# Patient Record
Sex: Female | Born: 1945
Health system: Southern US, Community
[De-identification: ages and names within clinical notes are randomized; demographics above are authoritative.]

## PROBLEM LIST (undated history)

## (undated) DIAGNOSIS — I10 Essential (primary) hypertension: Secondary | ICD-10-CM

## (undated) DIAGNOSIS — T7840XA Allergy, unspecified, initial encounter: Secondary | ICD-10-CM

## (undated) DIAGNOSIS — E785 Hyperlipidemia, unspecified: Secondary | ICD-10-CM

## (undated) DIAGNOSIS — J310 Chronic rhinitis: Secondary | ICD-10-CM

## (undated) DIAGNOSIS — Z86010 Personal history of colonic polyps: Secondary | ICD-10-CM

## (undated) DIAGNOSIS — R7302 Impaired glucose tolerance (oral): Secondary | ICD-10-CM

## (undated) DIAGNOSIS — N951 Menopausal and female climacteric states: Secondary | ICD-10-CM

## (undated) DIAGNOSIS — K5730 Diverticulosis of large intestine without perforation or abscess without bleeding: Secondary | ICD-10-CM

## (undated) HISTORY — DX: Hyperlipidemia, unspecified: E78.5

## (undated) HISTORY — PX: BREAST BIOPSY: SHX20

## (undated) HISTORY — DX: Allergy, unspecified, initial encounter: T78.40XA

## (undated) HISTORY — PX: POLYPECTOMY: SHX149

## (undated) HISTORY — DX: Essential (primary) hypertension: I10

## (undated) HISTORY — DX: Diverticulosis of large intestine without perforation or abscess without bleeding: K57.30

## (undated) HISTORY — DX: Personal history of colonic polyps: Z86.010

## (undated) HISTORY — DX: Impaired glucose tolerance (oral): R73.02

## (undated) HISTORY — PX: COLONOSCOPY: SHX174

## (undated) HISTORY — DX: Chronic rhinitis: J31.0

## (undated) HISTORY — DX: Menopausal and female climacteric states: N95.1

## (undated) HISTORY — PX: EYE SURGERY: SHX253

---

## 1992-01-22 HISTORY — PX: TUBAL LIGATION: SHX77

## 2001-04-10 ENCOUNTER — Other Ambulatory Visit: Admission: RE | Admit: 2001-04-10 | Discharge: 2001-04-10 | Payer: Self-pay | Admitting: Internal Medicine

## 2003-04-20 ENCOUNTER — Other Ambulatory Visit: Admission: RE | Admit: 2003-04-20 | Discharge: 2003-04-20 | Payer: Self-pay | Admitting: Internal Medicine

## 2004-05-28 ENCOUNTER — Other Ambulatory Visit: Admission: RE | Admit: 2004-05-28 | Discharge: 2004-05-28 | Payer: Self-pay | Admitting: Internal Medicine

## 2004-05-28 ENCOUNTER — Ambulatory Visit: Payer: Self-pay | Admitting: Internal Medicine

## 2004-05-31 ENCOUNTER — Ambulatory Visit: Payer: Self-pay | Admitting: Gastroenterology

## 2004-06-11 ENCOUNTER — Ambulatory Visit: Payer: Self-pay | Admitting: Gastroenterology

## 2004-06-11 ENCOUNTER — Encounter (INDEPENDENT_AMBULATORY_CARE_PROVIDER_SITE_OTHER): Payer: Self-pay | Admitting: Specialist

## 2004-09-28 ENCOUNTER — Ambulatory Visit: Payer: Self-pay | Admitting: Internal Medicine

## 2005-01-21 LAB — HM MAMMOGRAPHY

## 2005-04-01 ENCOUNTER — Ambulatory Visit: Payer: Self-pay | Admitting: Internal Medicine

## 2005-06-10 ENCOUNTER — Ambulatory Visit: Payer: Self-pay | Admitting: Internal Medicine

## 2005-10-07 ENCOUNTER — Ambulatory Visit: Payer: Self-pay | Admitting: Internal Medicine

## 2006-06-23 ENCOUNTER — Ambulatory Visit: Payer: Self-pay | Admitting: Internal Medicine

## 2006-06-24 ENCOUNTER — Encounter: Payer: Self-pay | Admitting: Internal Medicine

## 2006-06-24 DIAGNOSIS — Z860100 Personal history of colon polyps, unspecified: Secondary | ICD-10-CM

## 2006-06-24 DIAGNOSIS — N951 Menopausal and female climacteric states: Secondary | ICD-10-CM

## 2006-06-24 DIAGNOSIS — I10 Essential (primary) hypertension: Secondary | ICD-10-CM | POA: Insufficient documentation

## 2006-06-24 HISTORY — DX: Personal history of colon polyps, unspecified: Z86.0100

## 2006-06-24 HISTORY — DX: Menopausal and female climacteric states: N95.1

## 2006-06-24 HISTORY — DX: Personal history of colonic polyps: Z86.010

## 2006-06-24 HISTORY — DX: Essential (primary) hypertension: I10

## 2006-11-11 ENCOUNTER — Ambulatory Visit: Payer: Self-pay | Admitting: Internal Medicine

## 2006-11-11 DIAGNOSIS — K5730 Diverticulosis of large intestine without perforation or abscess without bleeding: Secondary | ICD-10-CM

## 2006-11-11 HISTORY — DX: Diverticulosis of large intestine without perforation or abscess without bleeding: K57.30

## 2006-11-11 LAB — CONVERTED CEMR LAB
ALT: 17 units/L (ref 0–35)
AST: 19 units/L (ref 0–37)
Albumin: 4 g/dL (ref 3.5–5.2)
Alkaline Phosphatase: 69 units/L (ref 39–117)
BUN: 10 mg/dL (ref 6–23)
Basophils Absolute: 0 10*3/uL (ref 0.0–0.1)
Basophils Relative: 0.1 % (ref 0.0–1.0)
CO2: 34 meq/L — ABNORMAL HIGH (ref 19–32)
Calcium: 9.7 mg/dL (ref 8.4–10.5)
Chloride: 103 meq/L (ref 96–112)
Cholesterol: 201 mg/dL (ref 0–200)
Creatinine, Ser: 0.8 mg/dL (ref 0.4–1.2)
MCHC: 34.5 g/dL (ref 30.0–36.0)
Monocytes Relative: 7.5 % (ref 3.0–11.0)
Platelets: 410 10*3/uL — ABNORMAL HIGH (ref 150–400)
Potassium: 3.9 meq/L (ref 3.5–5.1)
RBC: 4.32 M/uL (ref 3.87–5.11)
RDW: 12.2 % (ref 11.5–14.6)
TSH: 0.92 microintl units/mL (ref 0.35–5.50)
Total Bilirubin: 0.7 mg/dL (ref 0.3–1.2)
Total CHOL/HDL Ratio: 5
Triglycerides: 109 mg/dL (ref 0–149)
VLDL: 22 mg/dL (ref 0–40)

## 2006-11-19 ENCOUNTER — Telehealth: Payer: Self-pay | Admitting: Internal Medicine

## 2007-01-19 ENCOUNTER — Ambulatory Visit: Payer: Self-pay | Admitting: Family Medicine

## 2007-01-19 DIAGNOSIS — J069 Acute upper respiratory infection, unspecified: Secondary | ICD-10-CM | POA: Insufficient documentation

## 2007-01-19 DIAGNOSIS — J310 Chronic rhinitis: Secondary | ICD-10-CM

## 2007-01-19 HISTORY — DX: Chronic rhinitis: J31.0

## 2007-02-16 ENCOUNTER — Ambulatory Visit: Payer: Self-pay | Admitting: Internal Medicine

## 2007-07-27 ENCOUNTER — Ambulatory Visit: Payer: Self-pay | Admitting: Gastroenterology

## 2007-08-10 ENCOUNTER — Ambulatory Visit: Payer: Self-pay | Admitting: Gastroenterology

## 2007-08-10 LAB — HM COLONOSCOPY

## 2007-08-17 ENCOUNTER — Telehealth: Payer: Self-pay | Admitting: Gastroenterology

## 2007-10-28 ENCOUNTER — Ambulatory Visit: Payer: Self-pay | Admitting: Internal Medicine

## 2007-12-23 ENCOUNTER — Encounter: Payer: Self-pay | Admitting: Internal Medicine

## 2007-12-28 ENCOUNTER — Encounter: Payer: Self-pay | Admitting: Internal Medicine

## 2008-09-01 ENCOUNTER — Ambulatory Visit: Payer: Self-pay | Admitting: Internal Medicine

## 2008-12-13 ENCOUNTER — Telehealth: Payer: Self-pay | Admitting: Internal Medicine

## 2008-12-20 ENCOUNTER — Ambulatory Visit: Payer: Self-pay | Admitting: Internal Medicine

## 2008-12-20 DIAGNOSIS — E785 Hyperlipidemia, unspecified: Secondary | ICD-10-CM

## 2008-12-20 HISTORY — DX: Hyperlipidemia, unspecified: E78.5

## 2009-01-04 ENCOUNTER — Encounter (INDEPENDENT_AMBULATORY_CARE_PROVIDER_SITE_OTHER): Payer: Self-pay | Admitting: *Deleted

## 2009-01-10 ENCOUNTER — Encounter (INDEPENDENT_AMBULATORY_CARE_PROVIDER_SITE_OTHER): Payer: Self-pay | Admitting: *Deleted

## 2009-01-10 ENCOUNTER — Encounter: Payer: Self-pay | Admitting: Internal Medicine

## 2010-01-05 ENCOUNTER — Encounter: Payer: Self-pay | Admitting: Internal Medicine

## 2010-02-18 LAB — CONVERTED CEMR LAB
AST: 19 units/L (ref 0–37)
Alkaline Phosphatase: 60 units/L (ref 39–117)
BUN: 14 mg/dL (ref 6–23)
Basophils Absolute: 0 10*3/uL (ref 0.0–0.1)
Bilirubin, Direct: 0 mg/dL (ref 0.0–0.3)
Blood in Urine, dipstick: NEGATIVE
Calcium: 9.8 mg/dL (ref 8.4–10.5)
GFR calc non Af Amer: 108.63 mL/min (ref >60)
Glucose, Bld: 121 mg/dL — ABNORMAL HIGH (ref 70–99)
HDL: 43 mg/dL (ref >39.00)
Ketones, urine, test strip: NEGATIVE
Lymphocytes Relative: 41.8 % (ref 12.0–46.0)
Monocytes Relative: 7.6 % (ref 3.0–12.0)
Nitrite: NEGATIVE
Platelets: 268 10*3/uL (ref 150.0–400.0)
Protein, U semiquant: NEGATIVE
RDW: 13 % (ref 11.5–14.6)
Sodium: 141 meq/L (ref 135–145)
Specific Gravity, Urine: 1.015
Total Bilirubin: 1 mg/dL (ref 0.3–1.2)
Triglycerides: 99 mg/dL (ref 0.0–149.0)
VLDL: 19.8 mg/dL (ref 0.0–40.0)

## 2010-03-09 ENCOUNTER — Other Ambulatory Visit: Payer: Self-pay | Admitting: Internal Medicine

## 2010-03-09 DIAGNOSIS — I10 Essential (primary) hypertension: Secondary | ICD-10-CM

## 2010-05-23 ENCOUNTER — Telehealth: Payer: Self-pay | Admitting: Internal Medicine

## 2010-05-23 NOTE — Telephone Encounter (Signed)
Pt has fasting cpx scheduled for 07/04/10 at 8:45am. Pts spouse is coming in on 06/21/10 for ov at 8am and pt is req to rsc her cpx for that date in a.m., right after spouse appt. Pls advise.

## 2010-05-24 NOTE — Telephone Encounter (Signed)
Put her in at 815 slot and block 830 since this will be a appt. Stress they do need to be here at 8 to start since we are making an acception and scheduling her in a non cpx time frame.

## 2010-05-24 NOTE — Telephone Encounter (Signed)
Called and lft pt vm, stating that her cpx has been rsc to 06/21/10 per pt req, ok per dr.

## 2010-06-08 NOTE — Assessment & Plan Note (Signed)
[facility] OFFICE NOTE   NAME:[name] [name] WITMAN                      MRN:          [id number]  DATE:10/07/2005                            DOB:          Aug 30, 1945    This 65 year old female seen today for a comprehensive exam.  She has a  history of hypertension, colonic polyps, and is postmenopausal.  She has a  history of tubal ligation.  She has done quite well.   NO ALLERGIES.   Life-long nonsmoker.   MEDICAL REGIMEN:  Includes lisinopril hydrochlorothiazide combination.   REVIEW OF SYSTEMS:  Negative.  Did have a colonoscopy done approximately a  year-and-a-half ago that did reveal a large polyp.   FAMILY HISTORY:  Mother has a history of senile dementia.  Father died at 61  of lung cancer.  One brother and one sister remain well.   EXAMINATION:  VITAL SIGNS:  Blood pressure is 140/80 on repeat.  HEENT:  Fundi, ear, nose, and throat clear.  NECK:  No bruits.  BREASTS:  Negative.  CARDIOVASCULAR:  Normal heart sounds.  No murmurs.  ABDOMEN:  Benign.  PELVIC:  No abnormalities.  RECTAL:  Heme-negative.  EXTREMITIES:  No edema.  Peripheral pulses full.  NEURO:  Negative.   IMPRESSION:  1. Hypertension.  2. Colonic polyps.  3. Menopausal syndrome.   DISPOSITION:  Will recheck in 6 months.  Home blood pressure monitoring  encouraged.  Laboratory panel will be reviewed.                                  Gordy Savers, MD   PFK/MedQ  DD:  10/07/2005  DT:  10/07/2005  Job #:  [id number]

## 2010-06-20 ENCOUNTER — Encounter: Payer: Self-pay | Admitting: Internal Medicine

## 2010-06-21 ENCOUNTER — Encounter: Payer: Self-pay | Admitting: Internal Medicine

## 2010-06-21 ENCOUNTER — Ambulatory Visit (INDEPENDENT_AMBULATORY_CARE_PROVIDER_SITE_OTHER): Payer: BC Managed Care – PPO | Admitting: Internal Medicine

## 2010-06-21 VITALS — BP 110/74 | HR 74 | Temp 98.3°F | Resp 18 | Ht 60.0 in | Wt 172.0 lb

## 2010-06-21 DIAGNOSIS — I10 Essential (primary) hypertension: Secondary | ICD-10-CM

## 2010-06-21 DIAGNOSIS — Z86010 Personal history of colonic polyps: Secondary | ICD-10-CM

## 2010-06-21 DIAGNOSIS — E785 Hyperlipidemia, unspecified: Secondary | ICD-10-CM

## 2010-06-21 DIAGNOSIS — Z0000 Encounter for general adult medical examination without abnormal findings: Secondary | ICD-10-CM

## 2010-06-21 LAB — HEPATIC FUNCTION PANEL
ALT: 14 U/L (ref 0–35)
AST: 19 U/L (ref 0–37)
Alkaline Phosphatase: 62 U/L (ref 39–117)
Bilirubin, Direct: 0.1 mg/dL (ref 0.0–0.3)
Total Bilirubin: 0.7 mg/dL (ref 0.3–1.2)
Total Protein: 6.9 g/dL (ref 6.0–8.3)

## 2010-06-21 LAB — CBC WITH DIFFERENTIAL/PLATELET
Basophils Relative: 0.6 % (ref 0.0–3.0)
Eosinophils Relative: 4 % (ref 0.0–5.0)
Lymphocytes Relative: 47.9 % — ABNORMAL HIGH (ref 12.0–46.0)
MCV: 92 fl (ref 78.0–100.0)
Monocytes Absolute: 0.4 10*3/uL (ref 0.1–1.0)
Monocytes Relative: 7.6 % (ref 3.0–12.0)
Neutrophils Relative %: 39.9 % — ABNORMAL LOW (ref 43.0–77.0)
Platelets: 247 10*3/uL (ref 150.0–400.0)
RBC: 4.42 Mil/uL (ref 3.87–5.11)
WBC: 4.9 10*3/uL (ref 4.5–10.5)

## 2010-06-21 LAB — LDL CHOLESTEROL, DIRECT: Direct LDL: 170.7 mg/dL

## 2010-06-21 LAB — BASIC METABOLIC PANEL
Chloride: 102 mEq/L (ref 96–112)
Creatinine, Ser: 0.7 mg/dL (ref 0.4–1.2)
GFR: 108.01 mL/min (ref >60.00)

## 2010-06-21 LAB — LIPID PANEL: Total CHOL/HDL Ratio: 5

## 2010-06-21 LAB — TSH: TSH: 1.22 u[IU]/mL (ref 0.35–5.50)

## 2010-06-21 MED ORDER — LISINOPRIL-HYDROCHLOROTHIAZIDE 20-12.5 MG PO TABS: 1.0000 | ORAL_TABLET | Freq: Every day | ORAL | Status: DC

## 2010-06-21 NOTE — Progress Notes (Signed)
  Subjective:    Patient ID: Mathews Argyle, female    DOB: [birth year], 65 y.o.   MRN: [id number]  HPI  Wt Readings from Last 3 Encounters:  06/21/10 172 lb (78.019 kg)  12/20/08 171 lb (77.565 kg)  09/01/08 174 lb (78.926 kg)     History of Present Illness:  65 year old patient who is seen today for an annual preventive health examination. She has a history of treated hypertension and colonic polyps. Last colonoscopy was in July of 2009 showed diverticulosis and a history of allergic rhinitis. Her blood pressure has been well-controlled. She has a history of exogenous obesity  Allergies:  No Known Drug Allergies  Past History:  Past Medical History:  Colonic polyps, hx of  Diverticulosis, colon  Hypertension  G3 P2 A0  Past Surgical History:  Tubal ligation  colonoscopy 7-09  Family History:  Reviewed history from 11/11/2006 and no changes required.  father died age 64, lung cancer  mother history senile dementia, Alzheimer's type  one brother-Parkinsonism  one sister in good health  Social History:  Reviewed history from 11/11/2006 and no changes required.  Married    Review of Systems     Objective:   Physical Exam        Assessment & Plan:

## 2010-06-21 NOTE — Patient Instructions (Signed)
Limit your sodium (Salt) intake    It is important that you exercise regularly, at least 20 minutes 3 to 4 times per week.  If you develop chest pain or shortness of breath seek  medical attention.  You need to lose weight.  Consider a lower calorie diet and regular exercise.  Return in 6 months for follow-up About Your Cholesterol Cholesterol (plaque) is a type of fat. Cholesterol travels through your body in your blood. Your body needs a small amount of cholesterol, but too much can cause health problems. You get cholesterol in two ways:  It is naturally made in your body by the liver.   It is a part of many foods that you eat, like:   Fatty meats.   Fried foods.   Dairy foods like whole milk, cheese, and butter.   Eggs.  WHY IS A HIGH CHOLESTEROL LEVEL BAD FOR ME?  Your blood vessels may clog up when you have too much cholesterol in your blood. This can cause:   Heart attacks.   Strokes.   Not enough blood flow to your heart, brain, kidneys, or feet.  IS ALL CHOLESTEROL BAD FOR ME? When you eat foods that have a lot of cholesterol, you add to the cholesterol that is already made by your body. Not all cholesterol is bad for you. There are 2 different kinds of cholesterol.    The "good" kind of cholesterol, called High-Density Lipoproteins (HDL). HDL helps your body. It finds and picks up "bad" cholesterol in your blood and takes it back to the liver. Your liver then gets rid of this bad cholesterol.   The "bad" kind of cholesterol is Low-Density Lipoproteins (LDL). LDL can clog your blood vessels. Too much LDL cholesterol is harmful to your body.  HOW WILL I KNOW IF MY CHOLESTEROL LEVEL IS HIGH?  A blood test is done to check your total cholesterol level. Your HDL and LDL level will also be checked.   Your total cholesterol should be less than 200 mg/ml. If it is more than 240 mg/ml, your cholesterol level is high.   Your LDL cholesterol should be less than 100 mg/ml.    Your HDL cholesterol should be between 50-60 mg/ml. An HDL level less than 40 mg/ml may lead to heart disease.  HOW TO LOWER YOUR CHOLESTEROL LEVEL  Eat a low-fat diet:   Eat less eggs, whole dairy products (whole milk, cheese, and butter), fatty meats, and fried foods.   Eat more fruits, vegetables, whole-wheat breads, lean chicken, and fish (such as salmon or tuna).   Exercise more. Talk to your doctor about an exercise plan that is right for you.   Keep your weight at a healthy level. Talk to your doctor about what is right for you.   Take medicines as your doctor tells you to.  HOW OFTEN SHOULD I GET MY CHOLESTEROL LEVEL CHECKED? Your cholesterol level should be checked at least once a year or as often as your doctor tells you. Easy-to-Read style based on content from [facility] [place], [place] Document Released: 04/05/2008 Document Re-Released: 11/04/2008 [facility] Patient Information 2011 [place], [place].Low-Fat, Low-Saturated-Fat, Low-Cholesterol Diets Food Selection Guide BREADS, CEREALS, PASTA, RICE, DRIED PEAS, AND BEANS These products are high in carbohydrates and most are low in fat. Therefore, they can be increased in the diet as substitutes for fatty foods. They too, however, contain calories and should not be eaten in excess. Cereals can be eaten for snacks as well  as for breakfast.   Include foods that contain fiber (fruits, vegetables, whole grains, and legumes). Research shows that fiber may lower blood cholesterol levels, especially the water-soluble fiber found in fruits, vegetables, oat products, and legumes. FRUITS AND VEGETABLES It is good to eat fruits and vegetables. Besides being sources of fiber, both are rich in vitamins and some minerals. They help you get the daily allowances of these nutrients. Fruits and vegetables can be used for snacks and desserts. MEATS Limit lean meat, chicken, [place], and fish to no more than 6 ounces per  day. Beef, Pork, and Lamb  Use lean cuts of beef, pork, and lamb. Lean cuts include:   Extra-lean ground beef.   Arm roast.   Sirloin tip.   Center-cut ham.   Round steak.   Loin chops.   Rump roast.   Tenderloin.  Trim all fat off the outside of meats before cooking. It is not necessary to severely decrease the intake of red meat, but lean choices should be made. Lean meat is rich in protein and contains a highly absorbable form of iron. Premenopausal women, in particular, should avoid reducing lean red meat because this could increase the risk for low red blood cells (iron-deficiency anemia). Processed Meats Processed meats, such as bacon, bologna, salami, sausage, and hot dogs contain large quantities of fat, are not rich in valuable nutrients, and should not be eaten very often. Organ Meats The organ meats, such as liver, sweetbreads, kidneys, and brain are very rich in cholesterol. They should be limited. Chicken and [place] These are good sources of protein. The fat of poultry can be reduced by removing the skin and underlying fat layers before cooking. Chicken and [place] can be substituted for lean red meat in the diet. Poultry should not be fried or covered with high-fat sauces. Fish and Shellfish Fish is a good source of protein. Shellfish contain cholesterol, but they usually are low in saturated fatty acids. The preparation of fish is important. Like chicken and [place], they should not be fried or covered with high-fat sauces. EGGS Egg yolks often are hidden in cooked and processed foods. Egg whites contain no fat or cholesterol. They can be eaten often. Try 1 to 2 egg whites instead of whole eggs in recipes or use egg substitutes that do not contain yolk. MILK AND DAIRY PRODUCTS Use skim or 1% milk instead of 2% or whole milk. Decrease whole milk, natural, and processed cheeses. Use nonfat or low-fat (2%) cottage cheese or low-fat cheeses made from vegetable oils. Choose  nonfat or low-fat (1 to 2%) yogurt. Experiment with evaporated skim milk in recipes that call for heavy cream. Substitute low-fat yogurt or low-fat cottage cheese for sour cream in dips and salad dressings. Have at least 2 servings of low-fat dairy products, such as 2 glasses of skim (or 1%) milk each day to help get your daily calcium intake. FATS AND OILS Reduce the total intake of fats, especially saturated fat. Butterfat, lard, and beef fats are high in saturated fat and cholesterol. These should be avoided as much as possible. Vegetable fats do not contain cholesterol, but certain vegetable fats, such as coconut oil, palm oil, and palm kernel oil are very high in saturated fats. These should be limited. These fats are often used in bakery goods, processed foods, popcorn, oils, and nondairy creamers. Vegetable shortenings and some peanut butters contain hydrogenated oils, which are also saturated fats. Read the labels on these foods and check for saturated vegetable  oils. Unsaturated vegetable oils and fats do not raise blood cholesterol. However, they should be limited because they are fats and are high in calories. Total fat should still be limited to 30% of your daily caloric intake. Desirable liquid vegetable oils are corn oil, cottonseed oil, olive oil, canola oil, safflower oil, soybean oil, and sunflower oil. Peanut oil is not as good, but small amounts are acceptable. Buy a heart-healthy tub margarine that has no partially hydrogenated oils in the ingredients. Mayonnaise and salad dressings often are made from unsaturated fats, but they should also be limited because of their high calorie and fat content. Seeds, nuts, peanut butter, olives, and avocados are high in fat, but the fat is mainly the unsaturated type. These foods should be limited mainly to avoid excess calories and fat. OTHER EATING TIPS Snacks  Most sweets should be limited as snacks. They tend to be rich in calories and fats, and  their caloric content outweighs their nutritional value. Some good choices in snacks are graham crackers, melba toast, soda crackers, bagels (no egg), English muffins, fruits, and vegetables. These snacks are preferable to snack crackers, [place] fries, and chips. Popcorn should be air-popped or cooked in small amounts of liquid vegetable oil. Desserts Eat fruit, low-fat yogurt, and fruit ices instead of pastries, cake, and cookies. Sherbet, angel food cake, gelatin dessert, frozen low-fat yogurt, or other frozen products that do not contain saturated fat (pure fruit juice bars, frozen ice pops) are also acceptable.   COOKING METHODS Choose those methods that use little or no fat. They include:  Poaching.   Braising.   Steaming.   Grilling.   Baking.   Stir-frying.   Broiling.   Microwaving.  Foods can be cooked in a nonstick pan without added fat, or use a nonfat cooking spray in regular cookware. Limit fried foods and avoid frying in saturated fat. Add moisture to lean meats by using water, broth, cooking wines, and other nonfat or low-fat sauces along with the cooking methods mentioned above. Soups and stews should be chilled after cooking. The fat that forms on top after a few hours in the refrigerator should be skimmed off. When preparing meals, avoid using excess salt. Salt can contribute to raising blood pressure in some people. EATING AWAY FROM HOME Order entres, potatoes, and vegetables without sauces or butter. When meat exceeds the size of a deck of cards (3 to 4 ounces), the rest can be taken home for another meal. Choose vegetable or fruit salads and ask for low-calorie salad dressings to be served on the side. Use dressings sparingly. Limit high-fat toppings, such as bacon, crumbled eggs, cheese, sunflower seeds, and olives. Ask for heart-healthy tub margarine instead of butter. Document Released: 06/29/2001 Document Re-Released: 04/03/2009 [facility] Patient Information 2011  [place], [place].

## 2010-06-21 NOTE — Progress Notes (Signed)
Subjective:    Patient ID: Brenda [name], female    DOB: November 23, 1945, 65 y.o.   MRN: [id number]  HPI  65 year old patient who is seen today for an annual physical medical problems include hypertension exogenous obesity and a history of colonic polyps. She is doing quite well today;  she retired at the first of the year and does seem to be exercising regularly but has not been successful with weight loss. She is doing quite well and denies any concerns or complaints  History of Present Illness:  65 year old patient who is seen today for an annual preventive health examination. She has a history of treated hypertension and colonic polyps. Last colonoscopy was in July of 2009 showed diverticulosis and a history of allergic rhinitis. Her blood pressure has been well-controlled. She has a history of exogenous obesity  Allergies:  No Known Drug Allergies  Past History:  Past Medical History:  Colonic polyps, hx of  Diverticulosis, colon  Hypertension  G3 P2 A0  Past Surgical History:  Tubal ligation  colonoscopy 7-09  Family History:  Reviewed history from 11/11/2006 and no changes required.  father died age 31, lung cancer  mother history senile dementia, Alzheimer's type  one brother-Parkinsonism  one sister in good health  Social History:  Reviewed history from 11/11/2006 and no changes required.  Married-  retired January 2012    Review of Systems  Constitutional: Negative for fever, appetite change, fatigue and unexpected weight change.  HENT: Negative for hearing loss, ear pain, nosebleeds, congestion, sore throat, mouth sores, trouble swallowing, neck stiffness, dental problem, voice change, sinus pressure and tinnitus.   Eyes: Negative for photophobia, pain, redness and visual disturbance.  Respiratory: Negative for cough, chest tightness and shortness of breath.   Cardiovascular: Negative for chest pain, palpitations and leg swelling.  Gastrointestinal: Negative for nausea,  vomiting, abdominal pain, diarrhea, constipation, blood in stool, abdominal distention and rectal pain.  Genitourinary: Negative for dysuria, urgency, frequency, hematuria, flank pain, vaginal bleeding, vaginal discharge, difficulty urinating, genital sores, vaginal pain, menstrual problem and pelvic pain.  Musculoskeletal: Negative for back pain and arthralgias.  Skin: Negative for rash.  Neurological: Negative for dizziness, syncope, speech difficulty, weakness, light-headedness, numbness and headaches.  Hematological: Negative for adenopathy. Does not bruise/bleed easily.  Psychiatric/Behavioral: Negative for suicidal ideas, behavioral problems, self-injury, dysphoric mood and agitation. The patient is not nervous/anxious.        Objective:   Physical Exam  Constitutional: She is oriented to person, place, and time. She appears well-developed and well-nourished.  HENT:  Head: Normocephalic and atraumatic.  Right Ear: External ear normal.  Left Ear: External ear normal.  Mouth/Throat: Oropharynx is clear and moist.  Eyes: Conjunctivae and EOM are normal.  Neck: Normal range of motion. Neck supple. No JVD present. No thyromegaly present.  Cardiovascular: Normal rate, regular rhythm, normal heart sounds and intact distal pulses.   No murmur heard. Pulmonary/Chest: Effort normal and breath sounds normal. She has no wheezes. She has no rales.  Abdominal: Soft. Bowel sounds are normal. She exhibits no distension and no mass. There is no tenderness. There is no rebound and no guarding.  Genitourinary: Vagina normal. Guaiac negative stool. No vaginal discharge found.  Musculoskeletal: Normal range of motion. She exhibits no edema and no tenderness.  Neurological: She is alert and oriented to person, place, and time. She has normal reflexes. No cranial nerve deficit. She exhibits normal muscle tone. Coordination normal.  Skin: Skin is warm and dry. No rash  noted.  Psychiatric: She has a normal  mood and affect. Her behavior is normal.          Assessment & Plan:   Annual health examination. Hypertension well controlled Exogenous obesity. Exercise weight loss better diet all encouraged  We'll recheck in 6 months

## 2010-07-04 ENCOUNTER — Encounter: Payer: Self-pay | Admitting: Internal Medicine

## 2010-09-04 ENCOUNTER — Telehealth: Payer: Self-pay | Admitting: Internal Medicine

## 2010-09-04 NOTE — Telephone Encounter (Signed)
Acute sinusitis symptoms for less than 10 days are generally not helped by antibiotic therapy.  Use saline irrigation, warm  moist compresses and over-the-counter decongestants only as directed.  Call if there is no improvement in 5 to 7 days, or sooner if you develop increasing pain, fever, or any new symptoms. 

## 2010-09-04 NOTE — Telephone Encounter (Signed)
Attempt to call- ans mach - LMTCB if questions - informed of dr. Vernon Prey instruction. KIK

## 2010-09-04 NOTE — Telephone Encounter (Signed)
Please advise 

## 2010-09-04 NOTE — Telephone Encounter (Signed)
Pt called and said that she has a sinus inf, like she gets every year at this time. Pt is req an antibiotic to be called in to [facility] on [address].

## 2010-10-02 ENCOUNTER — Ambulatory Visit (INDEPENDENT_AMBULATORY_CARE_PROVIDER_SITE_OTHER): Payer: BC Managed Care – PPO | Admitting: Internal Medicine

## 2010-10-02 ENCOUNTER — Encounter: Payer: Self-pay | Admitting: Internal Medicine

## 2010-10-02 VITALS — BP 140/90 | Temp 98.9°F | Wt 172.0 lb

## 2010-10-02 DIAGNOSIS — Z0000 Encounter for general adult medical examination without abnormal findings: Secondary | ICD-10-CM

## 2010-10-02 DIAGNOSIS — Z23 Encounter for immunization: Secondary | ICD-10-CM

## 2010-10-02 DIAGNOSIS — E785 Hyperlipidemia, unspecified: Secondary | ICD-10-CM

## 2010-10-02 DIAGNOSIS — I10 Essential (primary) hypertension: Secondary | ICD-10-CM

## 2010-10-02 NOTE — Patient Instructions (Signed)
Limit your sodium (Salt) intake    It is important that you exercise regularly, at least 20 minutes 3 to 4 times per week.  If you develop chest pain or shortness of breath seek  medical attention.  Please check your blood pressure on a regular basis.  If it is consistently greater than 150/90, please make an office appointment.  

## 2010-10-02 NOTE — Progress Notes (Signed)
  Subjective:    Patient ID: Brenda [name], female    DOB: [birth year], 65 y.o.   MRN: [id number]  HPI  65 year old patient who is seen today for followup of her hypertension. She was seen she describes some mild right leg cramping but otherwise doing quite well. She is exercising regularly and a bit disappointed about lack of weight loss. No new concerns or complaints. She has treated hypertension which has been well-controlled on her present regimen. for her annual exam last visit.    Review of Systems  Constitutional: Negative.   HENT: Negative for hearing loss, congestion, sore throat, rhinorrhea, dental problem, sinus pressure and tinnitus.   Eyes: Negative for pain, discharge and visual disturbance.  Respiratory: Negative for cough and shortness of breath.   Cardiovascular: Negative for chest pain, palpitations and leg swelling.  Gastrointestinal: Negative for nausea, vomiting, abdominal pain, diarrhea, constipation, blood in stool and abdominal distention.  Genitourinary: Negative for dysuria, urgency, frequency, hematuria, flank pain, vaginal bleeding, vaginal discharge, difficulty urinating, vaginal pain and pelvic pain.  Musculoskeletal: Negative for joint swelling, arthralgias and gait problem.  Skin: Negative for rash.  Neurological: Negative for dizziness, syncope, speech difficulty, weakness, numbness and headaches.  Hematological: Negative for adenopathy.  Psychiatric/Behavioral: Negative for behavioral problems, dysphoric mood and agitation. The patient is not nervous/anxious.        Objective:   Physical Exam  Constitutional: She is oriented to person, place, and time. She appears well-developed and well-nourished.  HENT:  Head: Normocephalic.  Right Ear: External ear normal.  Left Ear: External ear normal.  Mouth/Throat: Oropharynx is clear and moist.  Eyes: Conjunctivae and EOM are normal. Pupils are equal, round, and reactive to light.  Neck: Normal range of  motion. Neck supple. No thyromegaly present.  Cardiovascular: Normal rate, regular rhythm, normal heart sounds and intact distal pulses.   Pulmonary/Chest: Effort normal and breath sounds normal.  Abdominal: Soft. Bowel sounds are normal. She exhibits no mass. There is no tenderness.  Musculoskeletal: Normal range of motion.  Lymphadenopathy:    She has no cervical adenopathy.  Neurological: She is alert and oriented to person, place, and time.  Skin: Skin is warm and dry. No rash noted.  Psychiatric: She has a normal mood and affect. Her behavior is normal.          Assessment & Plan:   Hypertension stable Mild dyslipidemia Low salt diet encouraged exercise weight loss recommended a recheck in 6 months

## 2011-01-07 LAB — HM MAMMOGRAPHY: HM Mammogram: NEGATIVE

## 2011-01-08 ENCOUNTER — Encounter: Payer: Self-pay | Admitting: Internal Medicine

## 2011-04-23 ENCOUNTER — Telehealth: Payer: Self-pay | Admitting: Internal Medicine

## 2011-04-23 MED ORDER — FLUTICASONE PROPIONATE 50 MCG/ACT NA SUSP: 2.0000 | Freq: Every day | NASAL | Status: DC

## 2011-04-23 NOTE — Telephone Encounter (Signed)
Suggest Allegra 180 one daily; please call in a prescription for fluticasone nasal spray 2 puffs each side once daily

## 2011-04-23 NOTE — Telephone Encounter (Signed)
Pt called back stating sinus is draining in gums

## 2011-04-23 NOTE — Telephone Encounter (Signed)
Addended by: Duard Brady I on: 04/23/2011 01:46 PM   Modules accepted: Orders

## 2011-04-23 NOTE — Telephone Encounter (Signed)
Patient called stating that she is having congestion and would like to have something called into her pharmacy. Please advise.

## 2011-04-23 NOTE — Telephone Encounter (Signed)
Spoke with pt- informed of med touse otc and the nasal spray rx to use. If no better next week - will need to see

## 2011-04-23 NOTE — Telephone Encounter (Signed)
Please advise 

## 2011-05-09 ENCOUNTER — Encounter: Payer: Self-pay | Admitting: Internal Medicine

## 2011-05-09 ENCOUNTER — Ambulatory Visit (INDEPENDENT_AMBULATORY_CARE_PROVIDER_SITE_OTHER): Payer: BC Managed Care – PPO | Admitting: Internal Medicine

## 2011-05-09 VITALS — BP 130/80 | Temp 98.1°F | Wt 174.0 lb

## 2011-05-09 DIAGNOSIS — J310 Chronic rhinitis: Secondary | ICD-10-CM

## 2011-05-09 DIAGNOSIS — I10 Essential (primary) hypertension: Secondary | ICD-10-CM

## 2011-05-09 NOTE — Patient Instructions (Signed)
Use saline irrigation, warm  moist compresses and over-the-counter decongestants only as directed.  Call if there is no improvement in 5 to 7 days, or sooner if you develop increasing pain, fever, or any new symptoms. 

## 2011-05-09 NOTE — Progress Notes (Signed)
  Subjective:    Patient ID: Brenda [name], female    DOB: 08-Feb-1945, 66 y.o.   MRN: [id number]  HPI  66 year old patient who is in today for followup. She has a history of treated hypertension and also a history of allergic rhinitis. She's had some nonproductive cough headache sinus fullness. She has been on and off exiting the takes fluticasone nasal spray only sporadically. There's been no fever purulent drainage postnasal drip    Review of Systems  Constitutional: Negative.   HENT: Positive for congestion, rhinorrhea and sinus pressure. Negative for hearing loss, sore throat, dental problem and tinnitus.   Eyes: Negative for pain, discharge and visual disturbance.  Respiratory: Negative for cough and shortness of breath.   Cardiovascular: Negative for chest pain, palpitations and leg swelling.  Gastrointestinal: Negative for nausea, vomiting, abdominal pain, diarrhea, constipation, blood in stool and abdominal distention.  Genitourinary: Negative for dysuria, urgency, frequency, hematuria, flank pain, vaginal bleeding, vaginal discharge, difficulty urinating, vaginal pain and pelvic pain.  Musculoskeletal: Negative for joint swelling, arthralgias and gait problem.  Skin: Negative for rash.  Neurological: Positive for headaches. Negative for dizziness, syncope, speech difficulty, weakness and numbness.  Hematological: Negative for adenopathy.  Psychiatric/Behavioral: Negative for behavioral problems, dysphoric mood and agitation. The patient is not nervous/anxious.        Objective:   Physical Exam  Constitutional: She is oriented to person, place, and time. She appears well-developed and well-nourished.  HENT:  Head: Normocephalic.  Right Ear: External ear normal.  Left Ear: External ear normal.  Mouth/Throat: Oropharynx is clear and moist.  Eyes: Conjunctivae and EOM are normal. Pupils are equal, round, and reactive to light.  Neck: Normal range of motion. Neck supple. No  thyromegaly present.  Cardiovascular: Normal rate, regular rhythm, normal heart sounds and intact distal pulses.   Pulmonary/Chest: Effort normal and breath sounds normal.  Abdominal: Soft. Bowel sounds are normal. She exhibits no mass. There is no tenderness.  Musculoskeletal: Normal range of motion.  Lymphadenopathy:    She has no cervical adenopathy.  Neurological: She is alert and oriented to person, place, and time.  Skin: Skin is warm and dry. No rash noted.  Psychiatric: She has a normal mood and affect. Her behavior is normal.          Assessment & Plan:   Allergic rhinitis. Hypertension  We'll resume to take his own nasal spray start saline irrigation and short-term Nasal Decongestant

## 2011-06-25 ENCOUNTER — Other Ambulatory Visit: Payer: Self-pay | Admitting: Internal Medicine

## 2011-10-03 ENCOUNTER — Ambulatory Visit (INDEPENDENT_AMBULATORY_CARE_PROVIDER_SITE_OTHER): Payer: BC Managed Care – PPO

## 2011-10-03 DIAGNOSIS — Z23 Encounter for immunization: Secondary | ICD-10-CM

## 2011-10-11 ENCOUNTER — Ambulatory Visit: Payer: BC Managed Care – PPO

## 2012-01-08 LAB — HM MAMMOGRAPHY: HM Mammogram: NEGATIVE

## 2012-01-20 ENCOUNTER — Encounter: Payer: Self-pay | Admitting: Internal Medicine

## 2012-02-11 ENCOUNTER — Encounter: Payer: Self-pay | Admitting: Internal Medicine

## 2012-02-11 ENCOUNTER — Ambulatory Visit (INDEPENDENT_AMBULATORY_CARE_PROVIDER_SITE_OTHER): Payer: BC Managed Care – PPO | Admitting: Internal Medicine

## 2012-02-11 VITALS — BP 160/90 | HR 92 | Temp 98.3°F | Resp 20 | Wt 174.0 lb

## 2012-02-11 DIAGNOSIS — M2550 Pain in unspecified joint: Secondary | ICD-10-CM

## 2012-02-11 DIAGNOSIS — J310 Chronic rhinitis: Secondary | ICD-10-CM

## 2012-02-11 DIAGNOSIS — I10 Essential (primary) hypertension: Secondary | ICD-10-CM

## 2012-02-11 MED ORDER — LISINOPRIL-HYDROCHLOROTHIAZIDE 20-12.5 MG PO TABS: 1.0000 | ORAL_TABLET | Freq: Every day | ORAL | Status: DC

## 2012-02-11 MED ORDER — FLUTICASONE PROPIONATE 50 MCG/ACT NA SUSP: 2.0000 | Freq: Every day | NASAL | Status: DC

## 2012-02-11 MED ORDER — PREDNISONE 20 MG PO TABS: 20.0000 mg | ORAL_TABLET | Freq: Two times a day (BID) | ORAL | Status: DC

## 2012-02-11 NOTE — Progress Notes (Signed)
Subjective:    Patient ID: Brenda [name], female    DOB: [birth year], 67 y.o.   MRN: [id number]  HPI  67 year old patient who has a history of hypertension and chronic rhinitis. Her chief complaints today are sinus related with increase in the congestion and fullness. She's not been using fluticasone. But has been using Allegra sporadically. She has treated hypertension. She also complains of some musculoskeletal pain involving the neck right shoulder and right hip areas  Past Medical History  Diagnosis Date  . Chronic rhinitis 01/19/2007  . COLONIC POLYPS, HX OF 06/24/2006  . DIVERTICULOSIS, COLON 11/11/2006  . HYPERLIPIDEMIA 12/20/2008  . HYPERTENSION 06/24/2006  . MENOPAUSAL SYNDROME 06/24/2006  . Impaired glucose tolerance     History   Social History  . Marital Status: Married    Spouse Name: N/A    Number of Children: N/A  . Years of Education: N/A   Occupational History  . Not on file.   Social History Main Topics  . Smoking status: Never Smoker   . Smokeless tobacco: Never Used  . Alcohol Use: No  . Drug Use: No  . Sexually Active: Not on file   Other Topics Concern  . Not on file   Social History Narrative  . No narrative on file    Past Surgical History  Procedure Date  . Tubal ligation     Family History  Problem Relation Age of Onset  . Mental illness Mother   . Cancer Father     lung  . Parkinsonism Brother     No Known Allergies  Current Outpatient Prescriptions on File Prior to Visit  Medication Sig Dispense Refill  . fexofenadine (ALLEGRA ALLERGY) 180 MG tablet Take 180 mg by mouth daily.      Marland Kitchen lisinopril-hydrochlorothiazide (PRINZIDE,ZESTORETIC) 20-12.5 MG per tablet TAKE ONE TABLET BY MOUTH EVERY DAY  90 tablet  3  . fluticasone (FLONASE) 50 MCG/ACT nasal spray Place 2 sprays into the nose daily.  16 g  1    BP 160/90  Pulse 92  Temp 98.3 F (36.8 C) (Oral)  Resp 20  Wt 174 lb (78.926 kg)  SpO2 98%       Review of Systems    Constitutional: Negative.   HENT: Positive for congestion and rhinorrhea. Negative for hearing loss, sore throat, dental problem, sinus pressure and tinnitus.   Eyes: Negative for pain, discharge and visual disturbance.  Respiratory: Negative for cough and shortness of breath.   Cardiovascular: Negative for chest pain, palpitations and leg swelling.  Gastrointestinal: Negative for nausea, vomiting, abdominal pain, diarrhea, constipation, blood in stool and abdominal distention.  Genitourinary: Negative for dysuria, urgency, frequency, hematuria, flank pain, vaginal bleeding, vaginal discharge, difficulty urinating, vaginal pain and pelvic pain.  Musculoskeletal: Positive for arthralgias. Negative for joint swelling and gait problem.  Skin: Negative for rash.  Neurological: Negative for dizziness, syncope, speech difficulty, weakness, numbness and headaches.  Hematological: Negative for adenopathy.  Psychiatric/Behavioral: Negative for behavioral problems, dysphoric mood and agitation. The patient is not nervous/anxious.        Objective:   Physical Exam  Constitutional: She is oriented to person, place, and time. She appears well-developed and well-nourished.  HENT:  Head: Normocephalic.  Right Ear: External ear normal.  Left Ear: External ear normal.  Mouth/Throat: Oropharynx is clear and moist.  Eyes: Conjunctivae normal and EOM are normal. Pupils are equal, round, and reactive to light.  Neck: Normal range of motion. Neck supple. No thyromegaly present.  Cardiovascular: Normal rate, regular rhythm, normal heart sounds and intact distal pulses.   Pulmonary/Chest: Effort normal and breath sounds normal.  Abdominal: Soft. Bowel sounds are normal. She exhibits no mass. There is no tenderness.  Musculoskeletal: Normal range of motion.  Lymphadenopathy:    She has no cervical adenopathy.  Neurological: She is alert and oriented to person, place, and time.  Skin: Skin is warm and dry.  No rash noted.  Psychiatric: She has a normal mood and affect. Her behavior is normal.          Assessment & Plan:  Hypertension. Low-salt diet weight loss encouraged repeat blood pressure 140/84 Chronic allergic rhinitis. Will treat with a brief course of oral prednisone resume nasal steroids  Recheck 4 months

## 2012-02-11 NOTE — Patient Instructions (Signed)
  Acute sinusitis symptoms for less than 10 days are generally not helped by antibiotic therapy.  Use saline irrigation, warm  moist compresses and over-the-counter decongestants only as directed.  Call if there is no improvement in 5 to 7 days, or sooner if you develop increasing pain, fever, or any new s  Limit your sodium (Salt) intake    It is important that you exercise regularly, at least 20 minutes 3 to 4 times per week.  If you develop chest pain or shortness of breath seek  medical attention.  You need to lose weight.  Consider a lower calorie diet and regular exercise.ymptoms.

## 2012-04-07 ENCOUNTER — Other Ambulatory Visit: Payer: BC Managed Care – PPO

## 2012-04-13 ENCOUNTER — Encounter: Payer: BC Managed Care – PPO | Admitting: Internal Medicine

## 2012-05-07 ENCOUNTER — Ambulatory Visit: Payer: BC Managed Care – PPO

## 2012-05-07 ENCOUNTER — Ambulatory Visit: Payer: BC Managed Care – PPO | Admitting: Internal Medicine

## 2012-05-07 ENCOUNTER — Other Ambulatory Visit (INDEPENDENT_AMBULATORY_CARE_PROVIDER_SITE_OTHER): Payer: BC Managed Care – PPO

## 2012-05-07 DIAGNOSIS — Z0000 Encounter for general adult medical examination without abnormal findings: Secondary | ICD-10-CM

## 2012-05-07 LAB — POCT URINALYSIS DIPSTICK
Spec Grav, UA: >=1.03
pH, UA: 5.5

## 2012-05-07 LAB — CBC WITH DIFFERENTIAL/PLATELET
Basophils Relative: 0.5 % (ref 0.0–3.0)
Eosinophils Relative: 3.6 % (ref 0.0–5.0)
Lymphocytes Relative: 47.3 % — ABNORMAL HIGH (ref 12.0–46.0)
MCV: 91 fl (ref 78.0–100.0)
Neutrophils Relative %: 40.5 % — ABNORMAL LOW (ref 43.0–77.0)
RBC: 4.74 Mil/uL (ref 3.87–5.11)
WBC: 6 10*3/uL (ref 4.5–10.5)

## 2012-05-07 LAB — BASIC METABOLIC PANEL
Calcium: 9.7 mg/dL (ref 8.4–10.5)
Creatinine, Ser: 0.8 mg/dL (ref 0.4–1.2)
GFR: 97.67 mL/min (ref >60.00)

## 2012-05-07 LAB — HEPATIC FUNCTION PANEL
ALT: 13 U/L (ref 0–35)
Alkaline Phosphatase: 57 U/L (ref 39–117)
Bilirubin, Direct: 0 mg/dL (ref 0.0–0.3)
Total Protein: 7.5 g/dL (ref 6.0–8.3)

## 2012-05-07 LAB — LIPID PANEL
Total CHOL/HDL Ratio: 6
VLDL: 37.8 mg/dL (ref 0.0–40.0)

## 2012-05-14 ENCOUNTER — Encounter: Payer: Self-pay | Admitting: Internal Medicine

## 2012-05-14 ENCOUNTER — Ambulatory Visit (INDEPENDENT_AMBULATORY_CARE_PROVIDER_SITE_OTHER): Payer: BC Managed Care – PPO | Admitting: Internal Medicine

## 2012-05-14 VITALS — BP 150/80 | HR 84 | Temp 98.0°F | Resp 18 | Ht 59.75 in | Wt 172.0 lb

## 2012-05-14 DIAGNOSIS — I10 Essential (primary) hypertension: Secondary | ICD-10-CM

## 2012-05-14 DIAGNOSIS — Z0000 Encounter for general adult medical examination without abnormal findings: Secondary | ICD-10-CM

## 2012-05-14 DIAGNOSIS — Z860100 Personal history of colon polyps, unspecified: Secondary | ICD-10-CM

## 2012-05-14 DIAGNOSIS — E785 Hyperlipidemia, unspecified: Secondary | ICD-10-CM

## 2012-05-14 DIAGNOSIS — J310 Chronic rhinitis: Secondary | ICD-10-CM

## 2012-05-14 NOTE — Patient Instructions (Addendum)
Limit your sodium (Salt) intake    It is important that you exercise regularly, at least 20 minutes 3 to 4 times per week.  If you develop chest pain or shortness of breath seek  medical attention.  You need to lose weight.  Consider a lower calorie diet and regular exercise.  Return in 6 months for follow-up   

## 2012-05-14 NOTE — Progress Notes (Signed)
Patient ID: Brenda [name], female   DOB: 24-Oct-1945, 67 y.o.   MRN: [id number]  Subjective:    Patient ID: Brenda [name], female    DOB: 1945-02-16, 67 y.o.   MRN: [id number]  Hypertension Pertinent negatives include no chest pain, headaches, palpitations or shortness of breath.    67 year old patient who is seen today for an annual physical ; medical problems include hypertension exogenous obesity and a history of colonic polyps. She is doing quite well today;  she retired at the first of the year and does seem to be exercising regularly but has not been successful with weight loss. She is doing quite well and denies any concerns or complaints  Allergies:  No Known Drug Allergies   Past History:  Past Medical History:  Colonic polyps, hx of  Diverticulosis, colon  Hypertension  G3 P2 A0   Past Surgical History:   Tubal ligation  colonoscopy 7-09   Family History:  Reviewed history from 11/11/2006 and no changes required.  father died age 64, lung cancer  mother history senile dementia, Alzheimer's type  one brother-Parkinsonism  one sister in good health   Social History:  Reviewed history from 11/11/2006 and no changes required.  Married-  retired January 2012    Review of Systems  Constitutional: Negative for fever, appetite change, fatigue and unexpected weight change.  HENT: Negative for hearing loss, ear pain, nosebleeds, congestion, sore throat, mouth sores, trouble swallowing, neck stiffness, dental problem, voice change, sinus pressure and tinnitus.   Eyes: Negative for photophobia, pain, redness and visual disturbance.  Respiratory: Negative for cough, chest tightness and shortness of breath.   Cardiovascular: Negative for chest pain, palpitations and leg swelling.  Gastrointestinal: Negative for nausea, vomiting, abdominal pain, diarrhea, constipation, blood in stool, abdominal distention and rectal pain.  Genitourinary: Negative for dysuria, urgency,  frequency, hematuria, flank pain, vaginal bleeding, vaginal discharge, difficulty urinating, genital sores, vaginal pain, menstrual problem and pelvic pain.  Musculoskeletal: Negative for back pain and arthralgias.  Skin: Negative for rash.  Neurological: Negative for dizziness, syncope, speech difficulty, weakness, light-headedness, numbness and headaches.  Hematological: Negative for adenopathy. Does not bruise/bleed easily.  Psychiatric/Behavioral: Negative for suicidal ideas, behavioral problems, self-injury, dysphoric mood and agitation. The patient is not nervous/anxious.        Objective:   Physical Exam  Constitutional: She is oriented to person, place, and time. She appears well-developed and well-nourished.  HENT:  Head: Normocephalic and atraumatic.  Right Ear: External ear normal.  Left Ear: External ear normal.  Mouth/Throat: Oropharynx is clear and moist.  Eyes: Conjunctivae and EOM are normal.  Neck: Normal range of motion. Neck supple. No JVD present. No thyromegaly present.  Cardiovascular: Normal rate, regular rhythm, normal heart sounds and intact distal pulses.   No murmur heard. Pulmonary/Chest: Effort normal and breath sounds normal. She has no wheezes. She has no rales.  Abdominal: Soft. Bowel sounds are normal. She exhibits no distension and no mass. There is no tenderness. There is no rebound and no guarding.  Musculoskeletal: Normal range of motion. She exhibits no edema and no tenderness.  Neurological: She is alert and oriented to person, place, and time. She has normal reflexes. No cranial nerve deficit. She exhibits normal muscle tone. Coordination normal.  Skin: Skin is warm and dry. No rash noted.  Psychiatric: She has a normal mood and affect. Her behavior is normal.          Assessment & Plan:   Annual health  examination. Hypertension well controlled Exogenous obesity. Exercise weight loss better diet all encouraged  We'll recheck in 6 months

## 2012-05-28 ENCOUNTER — Encounter: Payer: Self-pay | Admitting: Gastroenterology

## 2012-06-19 ENCOUNTER — Ambulatory Visit (INDEPENDENT_AMBULATORY_CARE_PROVIDER_SITE_OTHER): Payer: BC Managed Care – PPO | Admitting: Internal Medicine

## 2012-06-19 ENCOUNTER — Encounter: Payer: Self-pay | Admitting: Internal Medicine

## 2012-06-19 VITALS — BP 150/80 | HR 102 | Temp 98.6°F | Resp 20 | Wt 170.0 lb

## 2012-06-19 DIAGNOSIS — I10 Essential (primary) hypertension: Secondary | ICD-10-CM

## 2012-06-19 DIAGNOSIS — M549 Dorsalgia, unspecified: Secondary | ICD-10-CM

## 2012-06-19 NOTE — Progress Notes (Signed)
Subjective:    Patient ID: Brenda [name], female    DOB: [birth year], 67 y.o.   MRN: [id number]  HPI  67 year old patient with treated hypertension. She presents today with a chief complaint of pain in the right anterior neck of 2 days duration. She also describes some stiffness in the lumbar back region that improves with the stretching and activities throughout the day. Her blood pressure has been well-controlled. Her chronic rhinitis has been stable.  Past Medical History  Diagnosis Date  . Chronic rhinitis 01/19/2007  . COLONIC POLYPS, HX OF 06/24/2006  . DIVERTICULOSIS, COLON 11/11/2006  . HYPERLIPIDEMIA 12/20/2008  . HYPERTENSION 06/24/2006  . MENOPAUSAL SYNDROME 06/24/2006  . Impaired glucose tolerance     History   Social History  . Marital Status: Married    Spouse Name: N/A    Number of Children: N/A  . Years of Education: N/A   Occupational History  . Not on file.   Social History Main Topics  . Smoking status: Never Smoker   . Smokeless tobacco: Never Used  . Alcohol Use: No  . Drug Use: No  . Sexually Active: Not on file   Other Topics Concern  . Not on file   Social History Narrative  . No narrative on file    Past Surgical History  Procedure Laterality Date  . Tubal ligation      Family History  Problem Relation Age of Onset  . Mental illness Mother   . Cancer Father     lung  . Parkinsonism Brother     No Known Allergies  Current Outpatient Prescriptions on File Prior to Visit  Medication Sig Dispense Refill  . fexofenadine (ALLEGRA ALLERGY) 180 MG tablet Take 180 mg by mouth daily.      . fluticasone (FLONASE) 50 MCG/ACT nasal spray Place 2 sprays into the nose daily.  16 g  1  . lisinopril-hydrochlorothiazide (PRINZIDE,ZESTORETIC) 20-12.5 MG per tablet Take 1 tablet by mouth daily.  90 tablet  3  . Pseudoephedrine-Ibuprofen (ADVIL COLD/SINUS PO) Take 1 tablet by mouth as needed.       No current facility-administered medications on file  prior to visit.    BP 150/80  Pulse 102  Temp(Src) 98.6 F (37 C) (Oral)  Resp 20  Wt 170 lb (77.111 kg)  BMI 33.46 kg/m2  SpO2 98%       Review of Systems  Constitutional: Negative.   HENT: Positive for congestion. Negative for hearing loss, sore throat, rhinorrhea, dental problem, sinus pressure and tinnitus.   Eyes: Negative for pain, discharge and visual disturbance.  Respiratory: Negative for cough and shortness of breath.   Cardiovascular: Negative for chest pain, palpitations and leg swelling.  Gastrointestinal: Negative for nausea, vomiting, abdominal pain, diarrhea, constipation, blood in stool and abdominal distention.  Genitourinary: Negative for dysuria, urgency, frequency, hematuria, flank pain, vaginal bleeding, vaginal discharge, difficulty urinating, vaginal pain and pelvic pain.  Musculoskeletal: Positive for back pain. Negative for joint swelling, arthralgias and gait problem.  Skin: Negative for rash.  Neurological: Negative for dizziness, syncope, speech difficulty, weakness, numbness and headaches.  Hematological: Negative for adenopathy.  Psychiatric/Behavioral: Negative for behavioral problems, dysphoric mood and agitation. The patient is not nervous/anxious.        Objective:   Physical Exam  Constitutional: She is oriented to person, place, and time. She appears well-nourished.  HENT:  Head: Normocephalic.  Right Ear: External ear normal.  Left Ear: External ear normal.  Mouth/Throat: Oropharynx is clear  and moist.  Oropharynx negative  Eyes: Conjunctivae and EOM are normal. Pupils are equal, round, and reactive to light.  Neck: Normal range of motion. Neck supple. No thyromegaly present.  Cardiovascular: Normal rate, regular rhythm, normal heart sounds and intact distal pulses.   Pulmonary/Chest: Effort normal and breath sounds normal.  Abdominal: Soft. Bowel sounds are normal. She exhibits no mass. There is no tenderness.  Musculoskeletal:  Normal range of motion.  Lymphadenopathy:    She has no cervical adenopathy.  Neurological: She is alert and oriented to person, place, and time.  Skin: Skin is warm and dry. No rash noted.  Psychiatric: She has a normal mood and affect. Her behavior is normal.          Assessment & Plan:   Right anterior neck pain. Normal clinical exam Hypertension stable Mild low back pain. Will try Aleve and stretching exercises  Recheck 6 months or as needed

## 2012-06-19 NOTE — Patient Instructions (Signed)
Limit your sodium (Salt) intake   Take Aleve 200 mg twice daily for pain or swelling Back Exercises Back exercises help treat and prevent back injuries. The goal of back exercises is to increase the strength of your abdominal and back muscles and the flexibility of your back. These exercises should be started when you no longer have back pain. Back exercises include:  Pelvic Tilt. Lie on your back with your knees bent. Tilt your pelvis until the lower part of your back is against the floor. Hold this position 5 to 10 sec and repeat 5 to 10 times.  Knee to Chest. Pull first 1 knee up against your chest and hold for 20 to 30 seconds, repeat this with the other knee, and then both knees. This may be done with the other leg straight or bent, whichever feels better.  Sit-Ups or Curl-Ups. Bend your knees 90 degrees. Start with tilting your pelvis, and do a partial, slow sit-up, lifting your trunk only 30 to 45 degrees off the floor. Take at least 2 to 3 seconds for each sit-up. Do not do sit-ups with your knees out straight. If partial sit-ups are difficult, simply do the above but with only tightening your abdominal muscles and holding it as directed.  Hip-Lift. Lie on your back with your knees flexed 90 degrees. Push down with your feet and shoulders as you raise your hips a couple inches off the floor; hold for 10 seconds, repeat 5 to 10 times.  Back arches. Lie on your stomach, propping yourself up on bent elbows. Slowly press on your hands, causing an arch in your low back. Repeat 3 to 5 times. Any initial stiffness and discomfort should lessen with repetition over time.  Shoulder-Lifts. Lie face down with arms beside your body. Keep hips and torso pressed to floor as you slowly lift your head and shoulders off the floor. Do not overdo your exercises, especially in the beginning. Exercises may cause you some mild back discomfort which lasts for a few minutes; however, if the pain is more severe, or  lasts for more than 15 minutes, do not continue exercises until you see your caregiver. Improvement with exercise therapy for back problems is slow.  See your caregivers for assistance with developing a proper back exercise program. Document Released: 02/15/2004 Document Revised: 04/01/2011 Document Reviewed: 11/08/2010 [facility] Patient Information 2014 [place], [place]. Back Injury Prevention Back injuries can be extremely painful and difficult to heal. After having one back injury, you are much more likely to experience another later on. It is important to learn how to avoid injuring or re-injuring your back. The following tips can help you to prevent a back injury. PHYSICAL FITNESS  Exercise regularly and try to develop good tone in your abdominal muscles. Your abdominal muscles provide a lot of the support needed by your back.  Do aerobic exercises (walking, jogging, biking, swimming) regularly.  Do exercises that increase balance and strength (tai chi, yoga) regularly. This can decrease your risk of falling and injuring your back.  Stretch before and after exercising.  Maintain a healthy weight. The more you weigh, the more stress is placed on your back. For every pound of weight, 10 times that amount of pressure is placed on the back. DIET  Talk to your caregiver about how much calcium and vitamin D you need per day. These nutrients help to prevent weakening of the bones (osteoporosis). Osteoporosis can cause broken (fractured) bones that lead to back pain.  Include good  sources of calcium in your diet, such as dairy products, green, leafy vegetables, and products with calcium added (fortified).  Include good sources of vitamin D in your diet, such as milk and foods that are fortified with vitamin D.  Consider taking a nutritional supplement or a multivitamin if needed.  Stop smoking if you smoke. POSTURE  Sit and stand up straight. Avoid leaning forward when you sit or hunching  over when you stand.  Choose chairs with good low back (lumbar) support.  If you work at a desk, sit close to your work so you do not need to lean over. Keep your chin tucked in. Keep your neck drawn back and elbows bent at a right angle. Your arms should look like the letter "L."  Sit high and close to the steering wheel when you drive. Add a lumbar support to your car seat if needed.  Avoid sitting or standing in one position for too long. Take breaks to get up, stretch, and walk around at least once every hour. Take breaks if you are driving for long periods of time.  Sleep on your side with your knees slightly bent, or sleep on your back with a pillow under your knees. Do not sleep on your stomach. LIFTING, TWISTING, AND REACHING  Avoid heavy lifting, especially repetitive lifting. If you must do heavy lifting:  Stretch before lifting.  Work slowly.  Rest between lifts.  Use carts and dollies to move objects when possible.  Make several small trips instead of carrying 1 heavy load.  Ask for help when you need it.  Ask for help when moving big, awkward objects.  Follow these steps when lifting:  Stand with your feet shoulder-width apart.  Get as close to the object as you can. Do not try to pick up heavy objects that are far from your body.  Use handles or lifting straps if they are available.  Bend at your knees. Squat down, but keep your heels off the floor.  Keep your shoulders pulled back, your chin tucked in, and your back straight.  Lift the object slowly, tightening the muscles in your legs, abdomen, and buttocks. Keep the object as close to the center of your body as possible.  When you put a load down, use these same guidelines in reverse.  Do not:  Lift the object above your waist.  Twist at the waist while lifting or carrying a load. Move your feet if you need to turn, not your waist.  Bend over without bending at your knees.  Avoid reaching over  your head, across a table, or for an object on a high surface. OTHER TIPS  Avoid wet floors and keep sidewalks clear of ice to prevent falls.  Do not sleep on a mattress that is too soft or too hard.  Keep items that are used frequently within easy reach.  Put heavier objects on shelves at waist level and lighter objects on lower or higher shelves.  Find ways to decrease your stress, such as exercise, massage, or relaxation techniques. Stress can build up in your muscles. Tense muscles are more vulnerable to injury.  Seek treatment for depression or anxiety if needed. These conditions can increase your risk of developing back pain. SEEK MEDICAL CARE IF:  You injure your back.  You have questions about diet, exercise, or other ways to prevent back injuries. MAKE SURE YOU:  Understand these instructions.  Will watch your condition.  Will get help right away if  you are not doing well or get worse. Document Released: 02/15/2004 Document Revised: 04/01/2011 Document Reviewed: 02/18/2011 [facility] Patient Information 2014 [place], [place].

## 2012-06-22 ENCOUNTER — Encounter: Payer: Self-pay | Admitting: Gastroenterology

## 2012-08-06 ENCOUNTER — Encounter: Payer: Self-pay | Admitting: Gastroenterology

## 2012-08-17 ENCOUNTER — Ambulatory Visit (INDEPENDENT_AMBULATORY_CARE_PROVIDER_SITE_OTHER): Payer: BC Managed Care – PPO | Admitting: Internal Medicine

## 2012-08-17 DIAGNOSIS — Z2911 Encounter for prophylactic immunotherapy for respiratory syncytial virus (RSV): Secondary | ICD-10-CM

## 2012-08-17 DIAGNOSIS — Z23 Encounter for immunization: Secondary | ICD-10-CM

## 2012-08-18 ENCOUNTER — Ambulatory Visit (AMBULATORY_SURGERY_CENTER): Payer: BC Managed Care – PPO | Admitting: *Deleted

## 2012-08-18 VITALS — Ht 60.0 in | Wt 172.8 lb

## 2012-08-18 DIAGNOSIS — Z86010 Personal history of colonic polyps: Secondary | ICD-10-CM

## 2012-08-18 MED ORDER — MOVIPREP 100 G PO SOLR: 1.0000 | Freq: Once | ORAL | Status: DC

## 2012-08-18 NOTE — Progress Notes (Signed)
No egg or soy allergy. ewm No home 02 use. ewm No problems with past sedation. ewm Dr Jarold Motto did last colons in 2006 and 2009. In epic. ewm Pt declined emmi video. ewm

## 2012-08-20 ENCOUNTER — Encounter: Payer: Self-pay | Admitting: Gastroenterology

## 2012-08-26 ENCOUNTER — Other Ambulatory Visit: Payer: Self-pay

## 2012-08-31 ENCOUNTER — Ambulatory Visit (AMBULATORY_SURGERY_CENTER): Payer: BC Managed Care – PPO | Admitting: Gastroenterology

## 2012-08-31 ENCOUNTER — Encounter: Payer: Self-pay | Admitting: Gastroenterology

## 2012-08-31 VITALS — BP 128/74 | HR 67 | Temp 97.8°F | Resp 15 | Ht 60.0 in | Wt 172.0 lb

## 2012-08-31 DIAGNOSIS — Z86010 Personal history of colonic polyps: Secondary | ICD-10-CM

## 2012-08-31 DIAGNOSIS — K5730 Diverticulosis of large intestine without perforation or abscess without bleeding: Secondary | ICD-10-CM

## 2012-08-31 MED ORDER — SODIUM CHLORIDE 0.9 % IV SOLN: 500.0000 mL | INTRAVENOUS | Status: DC

## 2012-08-31 NOTE — Op Note (Signed)
[facility] [address] [place], [address]   COLONOSCOPY PROCEDURE REPORT  PATIENT: Brenda [name], Brenda [name]  MR#: [id number] BIRTHDATE: [birth year] , 67  yrs. old GENDER: Female ENDOSCOPIST: Mardella Layman, MD, [facility] REFERRED BY: PROCEDURE DATE:  08/31/2012 PROCEDURE:   Colonoscopy, surveillance First Screening Colonoscopy - Avg.  risk and is 50 yrs.  old or older - No.  Prior Negative Screening - Now for repeat screening. N/A  History of Adenoma - Now for follow-up colonoscopy & has been > or = to 3 yrs.  Yes hx of adenoma.  Has been 3 or more years since last colonoscopy. ASA CLASS:   Class II INDICATIONS:Patient's personal history of adenomatous colon polyps.  MEDICATIONS: propofol (Diprivan) 200mg  IV  DESCRIPTION OF PROCEDURE:   After the risks benefits and alternatives of the procedure were thoroughly explained, informed consent was obtained.  A digital rectal exam revealed no abnormalities of the rectum.   The LB [id number]  endoscope was introduced through the anus and advanced to the cecum, which was identified by both the appendix and ileocecal valve. No adverse events experienced.   The quality of the prep was excellent, using MoviPrep  The instrument was then slowly withdrawn as the colon was fully examined.      COLON FINDINGS: There was moderate diverticulosis noted in the sigmoid colon and descending colon with associated muscular hypertrophy and colonic spasm.   The colon was otherwise normal. There was no diverticulosis, inflammation, polyps or cancers unless previously stated.  Retroflexed views revealed no abnormalities save a small hypertrophied papilla.. The time to cecum=3 minutes 41 seconds.  Withdrawal time=6 minutes 04 seconds.  The scope was withdrawn and the procedure completed. COMPLICATIONS: There were no complications.  ENDOSCOPIC IMPRESSION: 1.   There was moderate diverticulosis noted in the sigmoid colon and  descending colon 2.   The colon was otherwise normal...no polyps noted,  RECOMMENDATIONS: 1.  High fiber diet 2.  Repeat Colonscopy in 10 years.   eSigned:  Mardella Layman, MD, [facility] 08/31/2012 9:59 AM   cc: Gordy Savers, MD   PATIENT NAME:  [name], [name] MR#: [id number]

## 2012-08-31 NOTE — Patient Instructions (Signed)
YOU HAD AN ENDOSCOPIC PROCEDURE TODAY AT THE [facility]: Refer to the procedure report that was given to you for any specific questions about what was found during the examination.  If the procedure report does not answer your questions, please call your gastroenterologist to clarify.  If you requested that your care partner not be given the details of your procedure findings, then the procedure report has been included in a sealed envelope for you to review at your convenience later.  YOU SHOULD EXPECT: Some feelings of bloating in the abdomen. Passage of more gas than usual.  Walking can help get rid of the air that was put into your GI tract during the procedure and reduce the bloating. If you had a lower endoscopy (such as a colonoscopy or flexible sigmoidoscopy) you may notice spotting of blood in your stool or on the toilet paper. If you underwent a bowel prep for your procedure, then you may not have a normal bowel movement for a few days.  DIET: Your first meal following the procedure should be a light meal and then it is ok to progress to your normal diet.  A half-sandwich or bowl of soup is an example of a good first meal.  Heavy or fried foods are harder to digest and may make you feel nauseous or bloated.  Likewise meals heavy in dairy and vegetables can cause extra gas to form and this can also increase the bloating.  Drink plenty of fluids but you should avoid alcoholic beverages for 24 hours.  ACTIVITY: Your care partner should take you home directly after the procedure.  You should plan to take it easy, moving slowly for the rest of the day.  You can resume normal activity the day after the procedure however you should NOT DRIVE or use heavy machinery for 24 hours (because of the sedation medicines used during the test).    SYMPTOMS TO REPORT IMMEDIATELY: A gastroenterologist can be reached at any hour.  During normal business hours, 8:30 AM to 5:00 PM Monday through Friday,  call [phone].  After hours and on weekends, please call the GI answering service at [phone] who will take a message and have the physician on call contact you.   Following lower endoscopy (colonoscopy or flexible sigmoidoscopy):  Excessive amounts of blood in the stool  Significant tenderness or worsening of abdominal pains  Swelling of the abdomen that is new, acute  Fever of 100F or higher    FOLLOW UP: If any biopsies were taken you will be contacted by phone or by letter within the next 1-3 weeks.  Call your gastroenterologist if you have not heard about the biopsies in 3 weeks.  Our staff will call the home number listed on your records the next business day following your procedure to check on you and address any questions or concerns that you may have at that time regarding the information given to you following your procedure. This is a courtesy call and so if there is no answer at the home number and we have not heard from you through the emergency physician on call, we will assume that you have returned to your regular daily activities without incident.  SIGNATURES/CONFIDENTIALITY: You and/or your care partner have signed paperwork which will be entered into your electronic medical record.  These signatures attest to the fact that that the information above on your After Visit Summary has been reviewed and is understood.  Full responsibility of the confidentiality   of this discharge information lies with you and/or your care-partner.     Information on diverticulosis & high fiber diet given to you today 

## 2012-08-31 NOTE — Progress Notes (Signed)
Patient did not experience any of the following events: a burn prior to discharge; a fall within the facility; wrong site/side/patient/procedure/implant event; or a hospital transfer or hospital admission upon discharge from the facility. (G8907) Patient did not have preoperative order for IV antibiotic SSI prophylaxis. (G8918)  

## 2012-08-31 NOTE — Progress Notes (Signed)
Procedure ends, tp recovery, report given and VSS. 

## 2012-09-07 ENCOUNTER — Ambulatory Visit (INDEPENDENT_AMBULATORY_CARE_PROVIDER_SITE_OTHER): Payer: BC Managed Care – PPO | Admitting: Internal Medicine

## 2012-09-07 ENCOUNTER — Encounter: Payer: Self-pay | Admitting: Internal Medicine

## 2012-09-07 VITALS — BP 150/90 | HR 96 | Temp 98.3°F | Resp 20 | Wt 170.0 lb

## 2012-09-07 DIAGNOSIS — I10 Essential (primary) hypertension: Secondary | ICD-10-CM

## 2012-09-07 DIAGNOSIS — J310 Chronic rhinitis: Secondary | ICD-10-CM

## 2012-09-07 NOTE — Patient Instructions (Signed)
Acute sinusitis symptoms for less than 10 days are generally not helped by antibiotic therapy.  Use saline irrigation, warm  moist compresses and over-the-counter decongestants only as directed.  Call if there is no improvement in 5 to 7 days, or sooner if you develop increasing pain, fever, or any new symptoms.  Return in 6 months for follow-up

## 2012-09-07 NOTE — Progress Notes (Signed)
Subjective:    Patient ID: Brenda [name], female    DOB: 11-05-1945, 67 y.o.   MRN: [id number]  HPI 67 year old patient who has hypertension and chronic rhinitis. She presents today complaining of some sinus fullness mild lightheadedness and a general sense of unwellness. There's been no fever or purulent sinus drainage. She has had a recent colonoscopy  Past Medical History  Diagnosis Date  . Chronic rhinitis 01/19/2007  . COLONIC POLYPS, HX OF 06/24/2006  . DIVERTICULOSIS, COLON 11/11/2006  . HYPERLIPIDEMIA 12/20/2008  . HYPERTENSION 06/24/2006  . MENOPAUSAL SYNDROME 06/24/2006  . Impaired glucose tolerance   . Allergy     seasonal    History   Social History  . Marital Status: Married    Spouse Name: N/A    Number of Children: N/A  . Years of Education: N/A   Occupational History  . Not on file.   Social History Main Topics  . Smoking status: Never Smoker   . Smokeless tobacco: Never Used  . Alcohol Use: No  . Drug Use: No  . Sexual Activity: Not on file   Other Topics Concern  . Not on file   Social History Narrative  . No narrative on file    Past Surgical History  Procedure Laterality Date  . Tubal ligation  1994    benign  . Breast biopsy    . Colonoscopy    . Polypectomy      Family History  Problem Relation Age of Onset  . Mental illness Mother   . Cancer Father     lung  . Parkinsonism Brother   . Colon cancer Neg Hx     No Known Allergies  Current Outpatient Prescriptions on File Prior to Visit  Medication Sig Dispense Refill  . fexofenadine (ALLEGRA ALLERGY) 180 MG tablet Take 180 mg by mouth daily.      . fluticasone (FLONASE) 50 MCG/ACT nasal spray Place 2 sprays into the nose daily.  16 g  1  . lisinopril-hydrochlorothiazide (PRINZIDE,ZESTORETIC) 20-12.5 MG per tablet Take 1 tablet by mouth daily.  90 tablet  3  . Pseudoephedrine-Ibuprofen (ADVIL COLD/SINUS PO) Take 1 tablet by mouth as needed.       No current facility-administered  medications on file prior to visit.    BP 150/90  Pulse 96  Temp(Src) 98.3 F (36.8 C) (Oral)  Resp 20  Wt 170 lb (77.111 kg)  BMI 33.2 kg/m2  SpO2 98%       Review of Systems  Constitutional: Negative.   HENT: Positive for congestion and sinus pressure. Negative for hearing loss, sore throat, rhinorrhea, dental problem and tinnitus.   Eyes: Negative for pain, discharge and visual disturbance.  Respiratory: Negative for cough and shortness of breath.   Cardiovascular: Negative for chest pain, palpitations and leg swelling.  Gastrointestinal: Negative for nausea, vomiting, abdominal pain, diarrhea, constipation, blood in stool and abdominal distention.  Genitourinary: Negative for dysuria, urgency, frequency, hematuria, flank pain, vaginal bleeding, vaginal discharge, difficulty urinating, vaginal pain and pelvic pain.  Musculoskeletal: Negative for joint swelling, arthralgias and gait problem.  Skin: Negative for rash.  Neurological: Negative for dizziness, syncope, speech difficulty, weakness, numbness and headaches.  Hematological: Negative for adenopathy.  Psychiatric/Behavioral: Negative for behavioral problems, dysphoric mood and agitation. The patient is not nervous/anxious.        Objective:   Physical Exam  Constitutional: She is oriented to person, place, and time. She appears well-developed and well-nourished.  HENT:  Head: Normocephalic.  Right Ear: External ear normal.  Left Ear: External ear normal.  Mouth/Throat: Oropharynx is clear and moist.  Eyes: Conjunctivae and EOM are normal. Pupils are equal, round, and reactive to light.  Neck: Normal range of motion. Neck supple. No thyromegaly present.  Cardiovascular: Normal rate, regular rhythm, normal heart sounds and intact distal pulses.   Pulmonary/Chest: Effort normal and breath sounds normal.  Abdominal: Soft. Bowel sounds are normal. She exhibits no mass. There is no tenderness.  Musculoskeletal: Normal  range of motion.  Lymphadenopathy:    She has no cervical adenopathy.  Neurological: She is alert and oriented to person, place, and time.  Skin: Skin is warm and dry. No rash noted.  Psychiatric: She has a normal mood and affect. Her behavior is normal.          Assessment & Plan:   Hypertension. Stable Chronic rhinitis stable. We'll continue present regimen.  Recheck 6 months

## 2012-09-08 ENCOUNTER — Telehealth: Payer: Self-pay | Admitting: Internal Medicine

## 2012-09-08 NOTE — Telephone Encounter (Signed)
Okay to hold off on x-ray at this time unless neck pain persists. Hold blood pressure medication for a couple days and then rechallenge

## 2012-09-08 NOTE — Telephone Encounter (Signed)
Caller: Deneise/Patient; Phone: [phone]; Reason for Call: Pt was seen in office 09/07/12 by Dr Kirtland Bouchard and instructed to take Allegra, Prinizide, Advil cold and sinus and Aleve for neck/shoulder pain.  Pt took Aleve at bedtime for neck /shoulder hurting and used heating pad.  09/08/12 Pt woke up feeling fine then took Allegra and Prinizide.  Felt jittery, had 4 bowel movements (no diarrhea) and felt like she had no energy.  Pt ate a muffin and had tea, then later had vege soup.  Pt now feels completely fine.  Not having any neck or shoulder pain and head feels better.  Pt is concerned if the medication caused this or does she need to eat when taking medications.  Advised pt, due to upset stomach, it may help to eat something small before taking medications (pt states she usually doesn't eat much breakfast).  Pt is concerned regarding neck/shoulder pain coming and going and wants to know if Dr Kirtland Bouchard recommends xray?  Also, what are Dr Charm Rings thoughts on what caused her to feel different this am.

## 2012-09-08 NOTE — Telephone Encounter (Signed)
See message and advise

## 2012-09-08 NOTE — Telephone Encounter (Signed)
Spoke to pt told her Okay to hold off on x-ray at this time unless neck pain persists. Hold blood pressure medication for a couple days and then rechallenge per Dr. Kirtland Bouchard. Pt verbalized understanding. Told pt to make sure take medications with food so does not upset stomach, and if symptoms return after taking blood pressure medicine call office. Pt verbalized understanding.

## 2012-09-10 ENCOUNTER — Ambulatory Visit (INDEPENDENT_AMBULATORY_CARE_PROVIDER_SITE_OTHER): Payer: BC Managed Care – PPO | Admitting: Family Medicine

## 2012-09-10 ENCOUNTER — Encounter: Payer: Self-pay | Admitting: Family Medicine

## 2012-09-10 ENCOUNTER — Telehealth: Payer: Self-pay | Admitting: Internal Medicine

## 2012-09-10 VITALS — BP 150/88 | HR 88 | Temp 98.3°F | Wt 169.0 lb

## 2012-09-10 DIAGNOSIS — I10 Essential (primary) hypertension: Secondary | ICD-10-CM

## 2012-09-10 NOTE — Telephone Encounter (Signed)
Patient Information:  Caller Name: Brenda [name]  Phone: [phone]  Patient: Brenda [name], Brenda [name]  Gender: Female  DOB: 1945-08-24  Age: 67 Years  PCP: Eleonore Chiquito (Family Practice > 22yrs old)  Office Follow Up:  Does the office need to follow up with this patient?: No  Instructions For The Office: N/A  RN Note:  Patient states she has no headache or current "wooziness", but is not feeling up to par.  Took BP at 13:05 using a drug store device with it reading 205/104.  Patient's initial call was to return a call do to Donna-Dr. K's nurse.  Lupita Leash had told her not to take BP pill for a couple of days since patient had head and neck pain with sinus problems and wanted to take Advil cold medicine to see if it helped with pain.  Patient notes she took the cold medicine 3 times yesterday 09/09/12 and today she took BP pill and 1 Advil this morning.  BP was elevated.  Patient notes she her woozy-feeling was while she was out and about.  Having none now. She does note since colonoscopy on Monday that she has been eating salty foods which she feels is effecting her pressure. Today she is forcing fluids (water)  Due to uncertainty of BP and recent history patient was scheduled with Dr. Selena Batten for BP check today 09/10/12.  Patient given care advice with caller demonstrating her understanding.  Symptoms  Reason For Call & Symptoms: Calling Lupita Leash back.  Reviewed Health History In EMR: Yes  Reviewed Medications In EMR: Yes  Reviewed Allergies In EMR: Yes  Reviewed Surgeries / Procedures: Yes  Date of Onset of Symptoms: 09/10/2012  Guideline(s) Used:  High Blood Pressure  Disposition Per Guideline:   See Today in Office  Reason For Disposition Reached:   BP > 180/110  Advice Given:  Call Back If:  You become worse.  Patient Will Follow Care Advice:  YES  Appointment Scheduled:  09/10/2012 15:45 Appointment Scheduled Provider: Dr. Selena Batten  N/A

## 2012-09-10 NOTE — Progress Notes (Signed)
Chief Complaint  Patient presents with  . Hypertension    HPI:   Brenda [name] is a 67 yo F pt of Dr. Amador Cunas here for an acute visit for elevated BP: -hx of HTN on lisinopril-hctz 20-12.5 -she just saw here PCP 2 days ago for chornic rhinitis, BP mildly up but stable and recs to continue current meds and f/u in 6 months -per phone note - ? BP med upset stomach after colonoscopy and was holding BP med for a few days, checked BP at drug store and was elevated called nurse and told to come in for BP check -she has been eating a lot of sodium - potato chips and chicken soup -denies: HA, dizziness, CP, SOB, swelling, stomach issues today  ROS: See pertinent positives and negatives per HPI.  Past Medical History  Diagnosis Date  . Chronic rhinitis 01/19/2007  . COLONIC POLYPS, HX OF 06/24/2006  . DIVERTICULOSIS, COLON 11/11/2006  . HYPERLIPIDEMIA 12/20/2008  . HYPERTENSION 06/24/2006  . MENOPAUSAL SYNDROME 06/24/2006  . Impaired glucose tolerance   . Allergy     seasonal    Family History  Problem Relation Age of Onset  . Mental illness Mother   . Cancer Father     lung  . Parkinsonism Brother   . Colon cancer Neg Hx     History   Social History  . Marital Status: Married    Spouse Name: N/A    Number of Children: N/A  . Years of Education: N/A   Social History Main Topics  . Smoking status: Never Smoker   . Smokeless tobacco: Never Used  . Alcohol Use: No  . Drug Use: No  . Sexual Activity: None   Other Topics Concern  . None   Social History Narrative  . None    Current outpatient prescriptions:fexofenadine (ALLEGRA ALLERGY) 180 MG tablet, Take 180 mg by mouth daily., Disp: , Rfl: ;  fluticasone (FLONASE) 50 MCG/ACT nasal spray, Place 2 sprays into the nose daily., Disp: 16 g, Rfl: 1;  lisinopril-hydrochlorothiazide (PRINZIDE,ZESTORETIC) 20-12.5 MG per tablet, Take 1 tablet by mouth daily., Disp: 90 tablet, Rfl: 3 Pseudoephedrine-Ibuprofen (ADVIL COLD/SINUS  PO), Take 1 tablet by mouth as needed., Disp: , Rfl:   EXAM:  Filed Vitals:   09/10/12 1536  BP: 150/88  Pulse: 88  Temp: 98.3 F (36.8 C)    Body mass index is 33.01 kg/(m^2).  GENERAL: vitals reviewed and listed above, alert, oriented, appears well hydrated and in no acute distress  HEENT: atraumatic, conjunttiva clear, no obvious abnormalities on inspection of external nose and ears  NECK: no obvious masses on inspection  LUNGS: clear to auscultation bilaterally, no wheezes, rales or rhonchi, good air movement  CV: HRRR, no peripheral edema  MS: moves all extremities without noticeable abnormality  PSYCH: pleasant and cooperative, no obvious depression or anxiety  ASSESSMENT AND PLAN:  Discussed the following assessment and plan:  HYPERTENSION -BP stable here, has been mildly up at OV - offered adjustment in medications, but she would prefer to hold off on this and discuss with PCP -advised low sodium diet, healthy lifestyle, take BP medication daily in am -follow up with PCP in 1 month -Patient advised to return or notify a doctor immediately if symptoms worsen or persist or new concerns arise.  There are no Patient Instructions on file for this visit.   Kriste Basque R.

## 2012-09-22 ENCOUNTER — Encounter: Payer: Self-pay | Admitting: Internal Medicine

## 2012-09-28 ENCOUNTER — Ambulatory Visit (INDEPENDENT_AMBULATORY_CARE_PROVIDER_SITE_OTHER): Payer: BC Managed Care – PPO | Admitting: *Deleted

## 2012-09-28 DIAGNOSIS — Z23 Encounter for immunization: Secondary | ICD-10-CM

## 2013-01-18 LAB — HM MAMMOGRAPHY

## 2013-01-19 ENCOUNTER — Ambulatory Visit: Payer: BC Managed Care – PPO | Admitting: Internal Medicine

## 2013-01-20 ENCOUNTER — Encounter: Payer: Self-pay | Admitting: Internal Medicine

## 2013-01-27 ENCOUNTER — Ambulatory Visit (INDEPENDENT_AMBULATORY_CARE_PROVIDER_SITE_OTHER): Payer: BC Managed Care – PPO | Admitting: Internal Medicine

## 2013-01-27 ENCOUNTER — Encounter: Payer: Self-pay | Admitting: Internal Medicine

## 2013-01-27 VITALS — BP 150/90 | HR 88 | Temp 98.0°F | Resp 20 | Ht 59.75 in | Wt 176.0 lb

## 2013-01-27 DIAGNOSIS — R7302 Impaired glucose tolerance (oral): Secondary | ICD-10-CM

## 2013-01-27 DIAGNOSIS — R7309 Other abnormal glucose: Secondary | ICD-10-CM

## 2013-01-27 DIAGNOSIS — Z23 Encounter for immunization: Secondary | ICD-10-CM

## 2013-01-27 NOTE — Patient Instructions (Addendum)
Limit your sodium (Salt) intake    It is important that you exercise regularly, at least 20 minutes 3 to 4 times per week.  If you develop chest pain or shortness of breath seek  medical attention.  You need to lose weight.  Consider a lower calorie diet and regular exercise.  Return in 6 months for follow-up DASH Diet The DASH diet stands for "Dietary Approaches to Stop Hypertension." It is a healthy eating plan that has been shown to reduce high blood pressure (hypertension) in as little as 14 days, while also possibly providing other significant health benefits. These other health benefits include reducing the risk of breast cancer after menopause and reducing the risk of type 2 diabetes, heart disease, colon cancer, and stroke. Health benefits also include weight loss and slowing kidney failure in patients with chronic kidney disease.  DIET GUIDELINES  Limit salt (sodium). Your diet should contain less than 1500 mg of sodium daily.  Limit refined or processed carbohydrates. Your diet should include mostly whole grains. Desserts and added sugars should be used sparingly.  Include small amounts of heart-healthy fats. These types of fats include nuts, oils, and tub margarine. Limit saturated and trans fats. These fats have been shown to be harmful in the body. CHOOSING FOODS  The following food groups are based on a 2000 calorie diet. See your Registered Dietitian for individual calorie needs. Grains and Grain Products (6 to 8 servings daily)  Eat More Often: Whole-wheat bread, brown rice, whole-grain or wheat pasta, quinoa, popcorn without added fat or salt (air popped).  Eat Less Often: White bread, white pasta, white rice, cornbread. Vegetables (4 to 5 servings daily)  Eat More Often: Fresh, frozen, and canned vegetables. Vegetables may be raw, steamed, roasted, or grilled with a minimal amount of fat.  Eat Less Often/Avoid: Creamed or fried vegetables. Vegetables in a cheese  sauce. Fruit (4 to 5 servings daily)  Eat More Often: All fresh, canned (in natural juice), or frozen fruits. Dried fruits without added sugar. One hundred percent fruit juice ( cup [237 mL] daily).  Eat Less Often: Dried fruits with added sugar. Canned fruit in light or heavy syrup. Foot LockerLean Meats, Fish, and Poultry (2 servings or less daily. One serving is 3 to 4 oz [85-114 g]).  Eat More Often: Ninety percent or leaner ground beef, tenderloin, sirloin. Round cuts of beef, chicken breast, Malaw[place]urkey breast. All fish. Grill, bake, or broil your meat. Nothing should be fried.  Eat Less Often/Avoid: Fatty cuts of meat, [place]key, or chicken leg, thigh, or wing. Fried cuts of meat or fish. Dairy (2 to 3 servings)  Eat More Often: Low-fat or fat-free milk, low-fat plain or light yogurt, reduced-fat or part-skim cheese.  Eat Less Often/Avoid: Milk (whole, 2%).Whole milk yogurt. Full-fat cheeses. Nuts, Seeds, and Legumes (4 to 5 servings per week)  Eat More Often: All without added salt.  Eat Less Often/Avoid: Salted nuts and seeds, canned beans with added salt. Fats and Sweets (limited)  Eat More Often: Vegetable oils, tub margarines without trans fats, sugar-free gelatin. Mayonnaise and salad dressings.  Eat Less Often/Avoid: Coconut oils, palm oils, butter, stick margarine, cream, half and half, cookies, candy, pie. FOR MORE INFORMATION The Dash Diet Eating Plan: www.dashdiet.org Document Released: 12/27/2010 Document Revised: 04/01/2011 Document Reviewed: 12/27/2010 [facility] Patient Information 2014 [employer], [place].

## 2013-01-27 NOTE — Progress Notes (Signed)
Pre-visit discussion using our clinic review tool. No additional management support is needed unless otherwise documented below in the visit note.  

## 2013-01-27 NOTE — Progress Notes (Signed)
Subjective:    Patient ID: Brenda [name], female    DOB: 10/31/1945, 68 y.o.   MRN: [id number]  HPI  68 year old patient who has a history of hypertension impaired glucose tolerance who is seen today for followup. Doing quite well. There's been some modest weight gain. No new concerns or complaints.  Past Medical History  Diagnosis Date  . Chronic rhinitis 01/19/2007  . COLONIC POLYPS, HX OF 06/24/2006  . DIVERTICULOSIS, COLON 11/11/2006  . HYPERLIPIDEMIA 12/20/2008  . HYPERTENSION 06/24/2006  . MENOPAUSAL SYNDROME 06/24/2006  . Impaired glucose tolerance   . Allergy     seasonal    History   Social History  . Marital Status: Married    Spouse Name: N/A    Number of Children: N/A  . Years of Education: N/A   Occupational History  . Not on file.   Social History Main Topics  . Smoking status: Never Smoker   . Smokeless tobacco: Never Used  . Alcohol Use: No  . Drug Use: No  . Sexual Activity: Not on file   Other Topics Concern  . Not on file   Social History Narrative  . No narrative on file    Past Surgical History  Procedure Laterality Date  . Tubal ligation  1994    benign  . Breast biopsy    . Colonoscopy    . Polypectomy      Family History  Problem Relation Age of Onset  . Mental illness Mother   . Cancer Father     lung  . Parkinsonism Brother   . Colon cancer Neg Hx     No Known Allergies  Current Outpatient Prescriptions on File Prior to Visit  Medication Sig Dispense Refill  . fexofenadine (ALLEGRA ALLERGY) 180 MG tablet Take 180 mg by mouth daily.      . fluticasone (FLONASE) 50 MCG/ACT nasal spray Place 2 sprays into the nose daily.  16 g  1  . lisinopril-hydrochlorothiazide (PRINZIDE,ZESTORETIC) 20-12.5 MG per tablet Take 1 tablet by mouth daily.  90 tablet  3  . Pseudoephedrine-Ibuprofen (ADVIL COLD/SINUS PO) Take 1 tablet by mouth as needed.       No current facility-administered medications on file prior to visit.    BP 150/90   Pulse 88  Temp(Src) 98 F (36.7 C) (Oral)  Resp 20  Ht 4' 11.75" (1.518 m)  Wt 176 lb (79.833 kg)  BMI 34.64 kg/m2  SpO2 98%       Review of Systems  Constitutional: Negative.   HENT: Negative for congestion, dental problem, hearing loss, rhinorrhea, sinus pressure, sore throat and tinnitus.   Eyes: Negative for pain, discharge and visual disturbance.  Respiratory: Negative for cough and shortness of breath.   Cardiovascular: Negative for chest pain, palpitations and leg swelling.  Gastrointestinal: Negative for nausea, vomiting, abdominal pain, diarrhea, constipation, blood in stool and abdominal distention.  Genitourinary: Negative for dysuria, urgency, frequency, hematuria, flank pain, vaginal bleeding, vaginal discharge, difficulty urinating, vaginal pain and pelvic pain.  Musculoskeletal: Negative for arthralgias, gait problem and joint swelling.  Skin: Negative for rash.  Neurological: Negative for dizziness, syncope, speech difficulty, weakness, numbness and headaches.  Hematological: Negative for adenopathy.  Psychiatric/Behavioral: Negative for behavioral problems, dysphoric mood and agitation. The patient is not nervous/anxious.        Objective:   Physical Exam  Constitutional: She is oriented to person, place, and time. She appears well-developed and well-nourished.  Blood pressure 140/80  HENT:  Head:  Normocephalic.  Right Ear: External ear normal.  Left Ear: External ear normal.  Mouth/Throat: Oropharynx is clear and moist.  Eyes: Conjunctivae and EOM are normal. Pupils are equal, round, and reactive to light.  Neck: Normal range of motion. Neck supple. No thyromegaly present.  Cardiovascular: Normal rate, regular rhythm, normal heart sounds and intact distal pulses.   Pulmonary/Chest: Effort normal and breath sounds normal.  Abdominal: Soft. Bowel sounds are normal. She exhibits no mass. There is no tenderness.  Musculoskeletal: Normal range of motion.    Lymphadenopathy:    She has no cervical adenopathy.  Neurological: She is alert and oriented to person, place, and time.  Skin: Skin is warm and dry. No rash noted.  Psychiatric: She has a normal mood and affect. Her behavior is normal.          Assessment & Plan:   Hypertension Mild obesity Impaired because tolerance  Weight loss encouraged No change in medicines CPX 6 months

## 2013-03-16 ENCOUNTER — Other Ambulatory Visit: Payer: Self-pay | Admitting: Internal Medicine

## 2013-04-19 ENCOUNTER — Ambulatory Visit (INDEPENDENT_AMBULATORY_CARE_PROVIDER_SITE_OTHER): Payer: BC Managed Care – PPO | Admitting: Internal Medicine

## 2013-04-19 ENCOUNTER — Ambulatory Visit: Payer: BC Managed Care – PPO

## 2013-04-19 ENCOUNTER — Telehealth: Payer: Self-pay | Admitting: Internal Medicine

## 2013-04-19 VITALS — BP 136/68 | HR 108 | Temp 97.9°F | Resp 16 | Ht 60.5 in | Wt 171.8 lb

## 2013-04-19 DIAGNOSIS — E049 Nontoxic goiter, unspecified: Secondary | ICD-10-CM

## 2013-04-19 DIAGNOSIS — K219 Gastro-esophageal reflux disease without esophagitis: Secondary | ICD-10-CM

## 2013-04-19 DIAGNOSIS — M542 Cervicalgia: Secondary | ICD-10-CM

## 2013-04-19 MED ORDER — OMEPRAZOLE 40 MG PO CPDR: 40.0000 mg | DELAYED_RELEASE_CAPSULE | Freq: Every day | ORAL | Status: DC

## 2013-04-19 MED ORDER — IBUPROFEN 600 MG PO TABS: 600.0000 mg | ORAL_TABLET | Freq: Three times a day (TID) | ORAL | Status: DC | PRN

## 2013-04-19 NOTE — Telephone Encounter (Signed)
Spoke to pt told her Dr. Kirtland BouchardK ordered a Thyroid Ultrasound and someone will contact you regarding an appointment. Pt verbalized understanding.

## 2013-04-19 NOTE — Telephone Encounter (Signed)
Please schedule a thyroid ultrasound

## 2013-04-19 NOTE — Progress Notes (Signed)
   Subjective:    Patient ID: Brenda [name], female    DOB: March 24, 1945, 68 y.o.   MRN: [id number]  HPI 68 year old female pt presents with neck pain. She has had the pain for about 1 month. The pain is on the left and right side; sometimes radiating into both shoulders. About a year ago she injured her right shoulder. She has been taking Advil cold and sinus. She also complains of reflux when she lays down at night. The reflux has been going on for about 3 weeks. She has been taking the [employer] brand of Beano.  No neck radiation, weakness, or numbness. Has full rom neck.  Review of Systems     Objective:   Physical Exam  Constitutional: She is oriented to person, place, and time. She appears well-developed and well-nourished. No distress.  HENT:  Head: Normocephalic.  Nose: Nose normal.  Eyes: EOM are normal. Pupils are equal, round, and reactive to light.  Neck: Normal range of motion. Neck supple. No tracheal deviation present. Thyromegaly present.  Pulmonary/Chest: Effort normal.  Musculoskeletal:       Cervical back: She exhibits tenderness, pain and spasm. She exhibits normal range of motion, no bony tenderness, no swelling, no edema, no deformity and normal pulse.  Lymphadenopathy:    She has no cervical adenopathy.  Neurological: She is alert and oriented to person, place, and time. She exhibits normal muscle tone. Coordination normal.  Psychiatric: She has a normal mood and affect.   [facility] reading (PRIMARY) by  Dr Perrin MalteseGuest DDD and spondylosis         Assessment & Plan:  Neck pain/Arthritis Goiter follow up with primary care. Gerd/ Omeprazole 40mg

## 2013-04-19 NOTE — Telephone Encounter (Signed)
Pt states she was seen at [facility] earlier this morning due to neck pain.  Patient states the physician she saw there recommended she have her PCP to order labs to check her thyroid for a goiter.

## 2013-04-19 NOTE — Patient Instructions (Signed)
Gastroesophageal Reflux Disease, Adult Gastroesophageal reflux disease (GERD) happens when acid from your stomach flows up into the esophagus. When acid comes in contact with the esophagus, the acid causes soreness (inflammation) in the esophagus. Over time, GERD may create small holes (ulcers) in the lining of the esophagus. CAUSES   Increased body weight. This puts pressure on the stomach, making acid rise from the stomach into the esophagus.  Smoking. This increases acid production in the stomach.  Drinking alcohol. This causes decreased pressure in the lower esophageal sphincter (valve or ring of muscle between the esophagus and stomach), allowing acid from the stomach into the esophagus.  Late evening meals and a full stomach. This increases pressure and acid production in the stomach.  A malformed lower esophageal sphincter. Sometimes, no cause is found. SYMPTOMS   Burning pain in the lower part of the mid-chest behind the breastbone and in the mid-stomach area. This may occur twice a week or more often.  Trouble swallowing.  Sore throat.  Dry cough.  Asthma-like symptoms including chest tightness, shortness of breath, or wheezing. DIAGNOSIS  Your caregiver may be able to diagnose GERD based on your symptoms. In some cases, X-rays and other tests may be done to check for complications or to check the condition of your stomach and esophagus. TREATMENT  Your caregiver may recommend over-the-counter or prescription medicines to help decrease acid production. Ask your caregiver before starting or adding any new medicines.  HOME CARE INSTRUCTIONS   Change the factors that you can control. Ask your caregiver for guidance concerning weight loss, quitting smoking, and alcohol consumption.  Avoid foods and drinks that make your symptoms worse, such as:  Caffeine or alcoholic drinks.  Chocolate.  Peppermint or mint flavorings.  Garlic and onions.  Spicy foods.  Citrus fruits,  such as oranges, lemons, or limes.  Tomato-based foods such as sauce, chili, salsa, and pizza.  Fried and fatty foods.  Avoid lying down for the 3 hours prior to your bedtime or prior to taking a nap.  Eat small, frequent meals instead of large meals.  Wear loose-fitting clothing. Do not wear anything tight around your waist that causes pressure on your stomach.  Raise the head of your bed 6 to 8 inches with wood blocks to help you sleep. Extra pillows will not help.  Only take over-the-counter or prescription medicines for pain, discomfort, or fever as directed by your caregiver.  Do not take aspirin, ibuprofen, or other nonsteroidal anti-inflammatory drugs (NSAIDs). SEEK IMMEDIATE MEDICAL CARE IF:   You have pain in your arms, neck, jaw, teeth, or back.  Your pain increases or changes in intensity or duration.  You develop nausea, vomiting, or sweating (diaphoresis).  You develop shortness of breath, or you faint.  Your vomit is green, yellow, black, or looks like coffee grounds or blood.  Your stool is red, bloody, or black. These symptoms could be signs of other problems, such as heart disease, gastric bleeding, or esophageal bleeding. MAKE SURE YOU:   Understand these instructions.  Will watch your condition.  Will get help right away if you are not doing well or get worse. Document Released: 10/17/2004 Document Revised: 04/01/2011 Document Reviewed: 07/27/2010 [facility] Patient Information 2014 [place], [place]. Goiter Goiter is an enlarged thyroid gland. The thyroid gland sits at the base of the front of the neck. The gland produces hormones that regulate mood, body temperature, pulse rate, and digestion. Most goiters are painless and are not a cause for serious concern.  Goiters and conditions that cause goiters can be treated if necessary.  CAUSES  Common causes of goiter include:  Graves disease (causes too much hormone to be produced  [hyperthyroidism]).  Hashimoto's disease (causes too little hormone to be produced [hypothyroidism]).  Thyroiditis (inflammation of the thyroid sometimes caused by virus or pregnancy).  Nodular goiter (small bumps form; sometimes called toxic nodular goiter).  Pregnancy.  Thyroid cancer (very few goiters with nodules are cancerous).  Certain medications.  Radiation exposure.  Iodine deficiency (more common in developing countries in inland populations). RISK FACTORS Risk factors for goiter include:  A family history of goiter.  Female gender.  Inadequate iodine in the diet.  Age older than 40 years. SYMPTOMS  Many goiters do not cause symptoms. When symptoms do occur, they may include:  Swelling in the lower part of the neck. This swelling can range from a very small bump to a large lump.  A tight feeling in the throat.  A hoarse voice. Less commonly, a goiter may result in:  Coughing.  Wheezing.  Difficulty swallowing.  Difficulty breathing.  Bulging neck veins.  Dizziness. When a goiter is the result of hyperthyroidism, symptoms may include:  Rapid or irregular heart beat.  Sicknessin your stomach (nausea).  Vomiting.  Diarrhea.  Shaking.  Irritable feeling.  Bulging eyes.  Weight loss.  Heat sensitivity.  Anxiety. When a goiter is the result of hypothyroidism, symptoms may include:  Tiredness.  Dry skin.  Constipation.  Weight gain.  Irregular menstrual cycle.  Depressed mood.  Sensitivity to cold. DIAGNOSIS  Tests used to diagnose goiter include:  A physical exam.  Blood tests, including thyroid hormone levels and antibody testing.  Ultrasonography, computerized X-ray scan (computed tomography, CT) or computerized magnetic scan (magnetic resonance imaging, MRI).  Thyroid scan (imaging along with safe radioactive injection).  Tissue sample taken (biopsy) of nodules. This is sometimes done to confirm that the nodules are  not cancerous. TREATMENT  Treatment will depend on the cause of the goiter. Treatment may include:  Monitoring. In some cases, no treatment is necessary, and your doctor will monitor yourcondition at regular check ups.  Medications and supplements. Thyroid medication (thyroid hormone replacement) is available for hyperthroidism and hypothyroidism.  If inflammation is the cause, over-the-counter medication or steroid medication may be recommended.  Goiters caused by iodine deficiency can be treated with iodine supplements or changes in diet.  Radioactive iodine treatment. Radioactive iodine is injected into the blood. It travels to the thyroid gland, kills thyroid cells, and reduces the size of the gland. This is only used when the thyroid gland is overactive. Lifelong thyroid hormone medication is often necessary after this treatment.  Surgery. A procedure to remove all or part of the gland may be recommended in severe cases or when cancer is the cause. Hormones can be taken to replace the hormones normally produced by the thyroid. HOME CARE INSTRUCTIONS   Take medications as directed.  Follow your caregiver's recommendations for any dietary changes.  Follow up with your caregiver for further examination and testing, as directed. PREVENTION   If you have a family history of goiter, discuss screening with your doctor.  Make sure you are getting enough iodine in your diet.  Use of iodized table salt can help prevent iodine deficiency. Document Released: 06/27/2009 Document Revised: 04/01/2011 Document Reviewed: 06/27/2009 [facility] Patient Information 2014 [place], [place].

## 2013-04-19 NOTE — Telephone Encounter (Signed)
Please advise 

## 2013-04-20 ENCOUNTER — Ambulatory Visit
Admission: RE | Admit: 2013-04-20 | Discharge: 2013-04-20 | Disposition: A | Discharge status: Home / Self Care | Payer: BC Managed Care – PPO | Source: Ambulatory Visit | Attending: Internal Medicine | Admitting: Internal Medicine

## 2013-04-20 DIAGNOSIS — E049 Nontoxic goiter, unspecified: Secondary | ICD-10-CM

## 2013-04-23 ENCOUNTER — Other Ambulatory Visit: Payer: BC Managed Care – PPO

## 2013-09-13 ENCOUNTER — Ambulatory Visit (INDEPENDENT_AMBULATORY_CARE_PROVIDER_SITE_OTHER): Payer: BC Managed Care – PPO | Admitting: Internal Medicine

## 2013-09-13 VITALS — BP 136/80 | HR 87 | Temp 98.2°F | Resp 18 | Ht 61.0 in | Wt 169.0 lb

## 2013-09-13 DIAGNOSIS — J018 Other acute sinusitis: Secondary | ICD-10-CM

## 2013-09-13 DIAGNOSIS — R070 Pain in throat: Secondary | ICD-10-CM

## 2013-09-13 DIAGNOSIS — R51 Headache: Secondary | ICD-10-CM

## 2013-09-13 DIAGNOSIS — R519 Headache, unspecified: Secondary | ICD-10-CM

## 2013-09-13 MED ORDER — PREDNISONE 10 MG PO TABS: ORAL_TABLET | ORAL | Status: DC

## 2013-09-13 MED ORDER — AMOXICILLIN 500 MG PO CAPS: 1000.0000 mg | ORAL_CAPSULE | Freq: Two times a day (BID) | ORAL | Status: DC

## 2013-09-13 NOTE — Progress Notes (Signed)
   Subjective:    Patient ID: Brenda [name], female    DOB: 1945-08-03, 68 y.o.   MRN: [id number]  HPI Left facial congestion and pain, assoc with uri. No chest sxs.   Review of Systems     Objective:   Physical Exam  Constitutional: She is oriented to person, place, and time. She appears well-developed and well-nourished. No distress.  HENT:  Head: Normocephalic.  Right Ear: External ear normal.  Left Ear: External ear normal.  Nose: Mucosal edema, rhinorrhea and sinus tenderness present. No epistaxis. Right sinus exhibits no maxillary sinus tenderness and no frontal sinus tenderness. Left sinus exhibits maxillary sinus tenderness and frontal sinus tenderness.    Mouth/Throat: Oropharynx is clear and moist.  pain  Eyes: EOM are normal. Pupils are equal, round, and reactive to light.  Neck: Normal range of motion. Neck supple.  Cardiovascular: Normal rate, regular rhythm and normal heart sounds.   Pulmonary/Chest: Effort normal.  Neurological: She is alert and oriented to person, place, and time. She exhibits normal muscle tone. Coordination normal.  Skin: No rash noted.  Psychiatric: She has a normal mood and affect.          Assessment & Plan:  Sinusitis/nasal obstruction Prednisone/Amoxil/Fluticasone

## 2013-09-13 NOTE — Patient Instructions (Signed)

## 2013-10-01 ENCOUNTER — Other Ambulatory Visit: Payer: Self-pay | Admitting: Internal Medicine

## 2013-10-02 ENCOUNTER — Other Ambulatory Visit: Payer: Self-pay | Admitting: Internal Medicine

## 2013-10-10 ENCOUNTER — Ambulatory Visit (INDEPENDENT_AMBULATORY_CARE_PROVIDER_SITE_OTHER): Payer: BC Managed Care – PPO | Admitting: Radiology

## 2013-10-10 DIAGNOSIS — Z23 Encounter for immunization: Secondary | ICD-10-CM

## 2013-10-16 ENCOUNTER — Encounter: Payer: Self-pay | Admitting: Gastroenterology

## 2013-12-09 ENCOUNTER — Other Ambulatory Visit: Payer: Self-pay | Admitting: Internal Medicine

## 2014-01-19 LAB — HM MAMMOGRAPHY

## 2014-01-29 ENCOUNTER — Encounter: Payer: Self-pay | Admitting: Internal Medicine

## 2014-04-11 ENCOUNTER — Encounter: Payer: Self-pay | Admitting: Internal Medicine

## 2014-04-11 ENCOUNTER — Ambulatory Visit (INDEPENDENT_AMBULATORY_CARE_PROVIDER_SITE_OTHER): Payer: BLUE CROSS/BLUE SHIELD | Admitting: Internal Medicine

## 2014-04-11 DIAGNOSIS — J310 Chronic rhinitis: Secondary | ICD-10-CM

## 2014-04-11 DIAGNOSIS — Z23 Encounter for immunization: Secondary | ICD-10-CM

## 2014-04-11 DIAGNOSIS — R7302 Impaired glucose tolerance (oral): Secondary | ICD-10-CM

## 2014-04-11 DIAGNOSIS — I10 Essential (primary) hypertension: Secondary | ICD-10-CM

## 2014-04-11 DIAGNOSIS — E785 Hyperlipidemia, unspecified: Secondary | ICD-10-CM

## 2014-04-11 LAB — LIPID PANEL
CHOLESTEROL: 255 mg/dL — AB (ref 0–200)
HDL: 40.4 mg/dL (ref >39.00)
NONHDL: 214.6
Total CHOL/HDL Ratio: 6
Triglycerides: 203 mg/dL — ABNORMAL HIGH (ref 0.0–149.0)
VLDL: 40.6 mg/dL — ABNORMAL HIGH (ref 0.0–40.0)

## 2014-04-11 LAB — LDL CHOLESTEROL, DIRECT: Direct LDL: 181 mg/dL

## 2014-04-11 LAB — CBC WITH DIFFERENTIAL/PLATELET
Basophils Absolute: 0 10*3/uL (ref 0.0–0.1)
Basophils Relative: 0.4 % (ref 0.0–3.0)
EOS PCT: 4.2 % (ref 0.0–5.0)
Eosinophils Absolute: 0.2 10*3/uL (ref 0.0–0.7)
HEMATOCRIT: 43.5 % (ref 36.0–46.0)
Hemoglobin: 15 g/dL (ref 12.0–15.0)
LYMPHS ABS: 2.7 10*3/uL (ref 0.7–4.0)
Lymphocytes Relative: 46.2 % — ABNORMAL HIGH (ref 12.0–46.0)
MCHC: 34.4 g/dL (ref 30.0–36.0)
MCV: 88.7 fl (ref 78.0–100.0)
Monocytes Absolute: 0.5 10*3/uL (ref 0.1–1.0)
Monocytes Relative: 7.8 % (ref 3.0–12.0)
Neutro Abs: 2.4 10*3/uL (ref 1.4–7.7)
Neutrophils Relative %: 41.4 % — ABNORMAL LOW (ref 43.0–77.0)
Platelets: 272 10*3/uL (ref 150.0–400.0)
RBC: 4.91 Mil/uL (ref 3.87–5.11)
RDW: 13.6 % (ref 11.5–15.5)
WBC: 5.9 10*3/uL (ref 4.0–10.5)

## 2014-04-11 LAB — HEMOGLOBIN A1C: Hgb A1c MFr Bld: 6.9 % — ABNORMAL HIGH (ref 4.6–6.5)

## 2014-04-11 LAB — COMPREHENSIVE METABOLIC PANEL
ALBUMIN: 4.2 g/dL (ref 3.5–5.2)
ALT: 15 U/L (ref 0–35)
AST: 15 U/L (ref 0–37)
Alkaline Phosphatase: 65 U/L (ref 39–117)
BUN: 13 mg/dL (ref 6–23)
CALCIUM: 10 mg/dL (ref 8.4–10.5)
CHLORIDE: 101 meq/L (ref 96–112)
CO2: 33 mEq/L — ABNORMAL HIGH (ref 19–32)
Creatinine, Ser: 0.86 mg/dL (ref 0.40–1.20)
GFR: 84.2 mL/min (ref >60.00)
Glucose, Bld: 134 mg/dL — ABNORMAL HIGH (ref 70–99)
Potassium: 4.4 mEq/L (ref 3.5–5.1)
Sodium: 141 mEq/L (ref 135–145)
Total Bilirubin: 0.6 mg/dL (ref 0.2–1.2)
Total Protein: 7.3 g/dL (ref 6.0–8.3)

## 2014-04-11 LAB — TSH: TSH: 1.26 u[IU]/mL (ref 0.35–4.50)

## 2014-04-11 MED ORDER — LISINOPRIL-HYDROCHLOROTHIAZIDE 20-12.5 MG PO TABS: 1.0000 | ORAL_TABLET | Freq: Every day | ORAL | Status: DC

## 2014-04-11 NOTE — Progress Notes (Signed)
Pre visit review using our clinic review tool, if applicable. No additional management support is needed unless otherwise documented below in the visit note. 

## 2014-04-11 NOTE — Progress Notes (Signed)
Subjective:    Patient ID: Brenda [name], female    DOB: 1945/05/08, 69 y.o.   MRN: [id number]  HPI  69 year old patient who is seen today in follow-up.  She has not been seen here in over one year.  She has essential hypertension and mild dyslipidemia.  She has perennial rhinitis  No recent lab.  Since her last visit here, she has been placed on omeprazole for reflux symptoms.  This has been helpful. No cardiopulmonary complaints. Her allergies have been stable on daily Allegra  Past Medical History  Diagnosis Date  . Chronic rhinitis 01/19/2007  . COLONIC POLYPS, HX OF 06/24/2006  . DIVERTICULOSIS, COLON 11/11/2006  . HYPERLIPIDEMIA 12/20/2008  . HYPERTENSION 06/24/2006  . MENOPAUSAL SYNDROME 06/24/2006  . Impaired glucose tolerance   . Allergy     seasonal    History   Social History  . Marital Status: Married    Spouse Name: N/A  . Number of Children: N/A  . Years of Education: N/A   Occupational History  . Not on file.   Social History Main Topics  . Smoking status: Never Smoker   . Smokeless tobacco: Never Used  . Alcohol Use: No  . Drug Use: No  . Sexual Activity: Not on file   Other Topics Concern  . Not on file   Social History Narrative    Past Surgical History  Procedure Laterality Date  . Tubal ligation  1994    benign  . Breast biopsy    . Colonoscopy    . Polypectomy      Family History  Problem Relation Age of Onset  . Mental illness Mother   . Cancer Father     lung  . Parkinsonism Brother   . Colon cancer Neg Hx     No Known Allergies  Current Outpatient Prescriptions on File Prior to Visit  Medication Sig Dispense Refill  . fexofenadine (ALLEGRA ALLERGY) 180 MG tablet Take 180 mg by mouth daily.    Marland Kitchen. ibuprofen (ADVIL,MOTRIN) 600 MG tablet Take 1 tablet (600 mg total) by mouth every 8 (eight) hours as needed. 30 tablet 0  . lisinopril-hydrochlorothiazide (PRINZIDE,ZESTORETIC) 20-12.5 MG per tablet TAKE ONE TABLET BY MOUTH ONCE  DAILY 90 tablet 0  . omeprazole (PRILOSEC) 40 MG capsule Take 1 capsule (40 mg total) by mouth daily. 30 capsule 3  . fluticasone (FLONASE) 50 MCG/ACT nasal spray Place 2 sprays into the nose daily. 16 g 1  . Pseudoephedrine-Ibuprofen (ADVIL COLD/SINUS PO) Take 1 tablet by mouth as needed.     No current facility-administered medications on file prior to visit.    BP 150/80 mmHg  Pulse 64  Temp(Src) 97.9 F (36.6 C) (Oral)  Resp 20  Ht 5\' 1"  (1.549 m)  Wt 177 lb (80.287 kg)  BMI 33.46 kg/m2  SpO2 96%     Review of Systems  Constitutional: Negative.   HENT: Positive for congestion and rhinorrhea. Negative for dental problem, hearing loss, sinus pressure, sore throat and tinnitus.   Eyes: Negative for pain, discharge and visual disturbance.  Respiratory: Negative for cough and shortness of breath.   Cardiovascular: Negative for chest pain, palpitations and leg swelling.  Gastrointestinal: Negative for nausea, vomiting, abdominal pain, diarrhea, constipation, blood in stool and abdominal distention.  Genitourinary: Negative for dysuria, urgency, frequency, hematuria, flank pain, vaginal bleeding, vaginal discharge, difficulty urinating, vaginal pain and pelvic pain.  Musculoskeletal: Negative for joint swelling, arthralgias and gait problem.  Skin: Negative for rash.  Neurological: Negative for dizziness, syncope, speech difficulty, weakness, numbness and headaches.  Hematological: Negative for adenopathy.  Psychiatric/Behavioral: Negative for behavioral problems, dysphoric mood and agitation. The patient is not nervous/anxious.        Objective:   Physical Exam  Constitutional: She is oriented to person, place, and time. She appears well-developed and well-nourished.  Blood pressure 130/80  HENT:  Head: Normocephalic.  Right Ear: External ear normal.  Left Ear: External ear normal.  Mouth/Throat: Oropharynx is clear and moist.  Eyes: Conjunctivae and EOM are normal. Pupils  are equal, round, and reactive to light.  Neck: Normal range of motion. Neck supple. No thyromegaly present.  Cardiovascular: Normal rate, regular rhythm, normal heart sounds and intact distal pulses.   Pulmonary/Chest: Effort normal and breath sounds normal.  Abdominal: Soft. Bowel sounds are normal. She exhibits no mass. There is no tenderness.  Musculoskeletal: Normal range of motion.  Lymphadenopathy:    She has no cervical adenopathy.  Neurological: She is alert and oriented to person, place, and time.  Skin: Skin is warm and dry. No rash noted.  Psychiatric: She has a normal mood and affect. Her behavior is normal.          Assessment & Plan:   Hypertension, well-controlled Allergic rhinitis History colonic polyps Gastroesophageal reflux disease Impaired glucose tolerance.  We'll check a hemoglobin A1c   CPX 6 months

## 2014-04-11 NOTE — Patient Instructions (Addendum)
Limit your sodium (Salt) intake    It is important that you exercise regularly, at least 20 minutes 3 to 4 times per week.  If you develop chest pain or shortness of breath seek  medical attention.  Please check your blood pressure on a regular basis.  If it is consistently greater than 150/90, please make an office appointment.  Return in 6 months for follow-up DASH Eating Plan DASH stands for "Dietary Approaches to Stop Hypertension." The DASH eating plan is a healthy eating plan that has been shown to reduce high blood pressure (hypertension). Additional health benefits may include reducing the risk of type 2 diabetes mellitus, heart disease, and stroke. The DASH eating plan may also help with weight loss. WHAT DO I NEED TO KNOW ABOUT THE DASH EATING PLAN? For the DASH eating plan, you will follow these general guidelines:  Choose foods with a percent daily value for sodium of less than 5% (as listed on the food label).  Use salt-free seasonings or herbs instead of table salt or sea salt.  Check with your health care provider or pharmacist before using salt substitutes.  Eat lower-sodium products, often labeled as "lower sodium" or "no salt added."  Eat fresh foods.  Eat more vegetables, fruits, and low-fat dairy products.  Choose whole grains. Look for the word "whole" as the first word in the ingredient list.  Choose fish and skinless chicken or [place] more often than red meat. Limit fish, poultry, and meat to 6 oz (170 g) each day.  Limit sweets, desserts, sugars, and sugary drinks.  Choose heart-healthy fats.  Limit cheese to 1 oz (28 g) per day.  Eat more home-cooked food and less restaurant, buffet, and fast food.  Limit fried foods.  Cook foods using methods other than frying.  Limit canned vegetables. If you do use them, rinse them well to decrease the sodium.  When eating at a restaurant, ask that your food be prepared with less salt, or no salt if  possible. WHAT FOODS CAN I EAT? Seek help from a dietitian for individual calorie needs. Grains Whole grain or whole wheat bread. Brown rice. Whole grain or whole wheat pasta. Quinoa, bulgur, and whole grain cereals. Low-sodium cereals. Corn or whole wheat flour tortillas. Whole grain cornbread. Whole grain crackers. Low-sodium crackers. Vegetables Fresh or frozen vegetables (raw, steamed, roasted, or grilled). Low-sodium or reduced-sodium tomato and vegetable juices. Low-sodium or reduced-sodium tomato sauce and paste. Low-sodium or reduced-sodium canned vegetables.  Fruits All fresh, canned (in natural juice), or frozen fruits. Meat and Other Protein Products Ground beef (85% or leaner), grass-fed beef, or beef trimmed of fat. Skinless chicken or [place]. Ground chicken or [place]key. Pork trimmed of fat. All fish and seafood. Eggs. Dried beans, peas, or lentils. Unsalted nuts and seeds. Unsalted canned beans. Dairy Low-fat dairy products, such as skim or 1% milk, 2% or reduced-fat cheeses, low-fat ricotta or cottage cheese, or plain low-fat yogurt. Low-sodium or reduced-sodium cheeses. Fats and Oils Tub margarines without trans fats. Light or reduced-fat mayonnaise and salad dressings (reduced sodium). Avocado. Safflower, olive, or canola oils. Natural peanut or almond butter. Other Unsalted popcorn and pretzels. The items listed above may not be a complete list of recommended foods or beverages. Contact your dietitian for more options. WHAT FOODS ARE NOT RECOMMENDED? Grains White bread. White pasta. White rice. Refined cornbread. Bagels and croissants. Crackers that contain trans fat. Vegetables Creamed or fried vegetables. Vegetables in a cheese sauce. Regular canned vegetables. Regular canned  tomato sauce and paste. Regular tomato and vegetable juices. Fruits Dried fruits. Canned fruit in light or heavy syrup. Fruit juice. Meat and Other Protein Products Fatty cuts of meat. Ribs, chicken  wings, bacon, sausage, bologna, salami, chitterlings, fatback, hot dogs, bratwurst, and packaged luncheon meats. Salted nuts and seeds. Canned beans with salt. Dairy Whole or 2% milk, cream, half-and-half, and cream cheese. Whole-fat or sweetened yogurt. Full-fat cheeses or blue cheese. Nondairy creamers and whipped toppings. Processed cheese, cheese spreads, or cheese curds. Condiments Onion and garlic salt, seasoned salt, table salt, and sea salt. Canned and packaged gravies. Worcestershire sauce. Tartar sauce. Barbecue sauce. Teriyaki sauce. Soy sauce, including reduced sodium. Steak sauce. Fish sauce. Oyster sauce. Cocktail sauce. Horseradish. Ketchup and mustard. Meat flavorings and tenderizers. Bouillon cubes. Hot sauce. Tabasco sauce. Marinades. Taco seasonings. Relishes. Fats and Oils Butter, stick margarine, lard, shortening, ghee, and bacon fat. Coconut, palm kernel, or palm oils. Regular salad dressings. Other Pickles and olives. Salted popcorn and pretzels. The items listed above may not be a complete list of foods and beverages to avoid. Contact your dietitian for more information. WHERE CAN I FIND MORE INFORMATION? National Heart, Lung, and Blood Institute: [employer] Document Released: 12/27/2010 Document Revised: 05/24/2013 Document Reviewed: 11/11/2012 [facility] Patient Information 2015 [place], [place]. This information is not intended to replace advice given to you by your health care provider. Make sure you discuss any questions you have with your health care provider.

## 2014-04-12 ENCOUNTER — Telehealth: Payer: Self-pay | Admitting: Internal Medicine

## 2014-04-12 ENCOUNTER — Other Ambulatory Visit: Payer: Self-pay | Admitting: *Deleted

## 2014-04-12 MED ORDER — METFORMIN HCL 500 MG PO TABS: 500.0000 mg | ORAL_TABLET | Freq: Two times a day (BID) | ORAL | Status: DC

## 2014-04-12 NOTE — Telephone Encounter (Signed)
emmi emailed °

## 2014-10-11 ENCOUNTER — Encounter: Payer: Self-pay | Admitting: Internal Medicine

## 2014-10-11 ENCOUNTER — Other Ambulatory Visit: Payer: Self-pay | Admitting: *Deleted

## 2014-10-11 ENCOUNTER — Ambulatory Visit (INDEPENDENT_AMBULATORY_CARE_PROVIDER_SITE_OTHER): Payer: BLUE CROSS/BLUE SHIELD | Admitting: Internal Medicine

## 2014-10-11 VITALS — BP 140/84 | HR 67 | Temp 98.3°F | Resp 20 | Ht 61.0 in | Wt 165.0 lb

## 2014-10-11 DIAGNOSIS — E785 Hyperlipidemia, unspecified: Secondary | ICD-10-CM

## 2014-10-11 DIAGNOSIS — Z23 Encounter for immunization: Secondary | ICD-10-CM

## 2014-10-11 DIAGNOSIS — R7302 Impaired glucose tolerance (oral): Secondary | ICD-10-CM | POA: Diagnosis not present

## 2014-10-11 DIAGNOSIS — E119 Type 2 diabetes mellitus without complications: Secondary | ICD-10-CM | POA: Insufficient documentation

## 2014-10-11 DIAGNOSIS — I10 Essential (primary) hypertension: Secondary | ICD-10-CM

## 2014-10-11 LAB — HEMOGLOBIN A1C: Hgb A1c MFr Bld: 6.5 % (ref 4.6–6.5)

## 2014-10-11 MED ORDER — LISINOPRIL-HYDROCHLOROTHIAZIDE 20-12.5 MG PO TABS: 1.0000 | ORAL_TABLET | Freq: Every day | ORAL | Status: DC

## 2014-10-11 NOTE — Progress Notes (Signed)
Subjective:    Patient ID: Brenda [name], female    DOB: 1945-10-29, 69 y.o.   MRN: [id number]  HPI 69 year old patient seen today for follow-up of type 2 diabetes.  Hemoglobin A1c elevated at 6.96 months ago.  She was placed on low-dose metformin but elected to change lifestyle instead.  Her weight is down very nicely, 12 pounds over the past 6 months.  She generally feels well.  She has treated hypertension which has been stable.  She has eye examination coming up in the near future.  Her allergy symptoms are stable  BP Readings from Last 3 Encounters:  10/11/14 140/84  04/11/14 150/80  09/13/13 136/80    Wt Readings from Last 3 Encounters:  10/11/14 165 lb (74.844 kg)  04/11/14 177 lb (80.287 kg)  09/13/13 169 lb (76.658 kg)    Past Medical History  Diagnosis Date  . Chronic rhinitis 01/19/2007  . COLONIC POLYPS, HX OF 06/24/2006  . DIVERTICULOSIS, COLON 11/11/2006  . HYPERLIPIDEMIA 12/20/2008  . HYPERTENSION 06/24/2006  . MENOPAUSAL SYNDROME 06/24/2006  . Impaired glucose tolerance   . Allergy     seasonal    Social History   Social History  . Marital Status: Married    Spouse Name: N/A  . Number of Children: N/A  . Years of Education: N/A   Occupational History  . Not on file.   Social History Main Topics  . Smoking status: Never Smoker   . Smokeless tobacco: Never Used  . Alcohol Use: No  . Drug Use: No  . Sexual Activity: Not on file   Other Topics Concern  . Not on file   Social History Narrative    Past Surgical History  Procedure Laterality Date  . Tubal ligation  1994    benign  . Breast biopsy    . Colonoscopy    . Polypectomy      Family History  Problem Relation Age of Onset  . Mental illness Mother   . Cancer Father     lung  . Parkinsonism Brother   . Colon cancer Neg Hx     No Known Allergies  Current Outpatient Prescriptions on File Prior to Visit  Medication Sig Dispense Refill  . fexofenadine (ALLEGRA ALLERGY) 180 MG  tablet Take 180 mg by mouth daily.    Marland Kitchen ibuprofen (ADVIL,MOTRIN) 600 MG tablet Take 1 tablet (600 mg total) by mouth every 8 (eight) hours as needed. 30 tablet 0  . omeprazole (PRILOSEC) 40 MG capsule Take 1 capsule (40 mg total) by mouth daily. 30 capsule 3  . Pseudoephedrine-Ibuprofen (ADVIL COLD/SINUS PO) Take 1 tablet by mouth as needed.    . fluticasone (FLONASE) 50 MCG/ACT nasal spray Place 2 sprays into the nose daily. 16 g 1  . metFORMIN (GLUCOPHAGE) 500 MG tablet Take 1 tablet (500 mg total) by mouth 2 (two) times daily with a meal. (Patient not taking: Reported on 10/11/2014) 180 tablet 1   No current facility-administered medications on file prior to visit.    BP 140/84 mmHg  Pulse 67  Temp(Src) 98.3 F (36.8 C) (Oral)  Resp 20  Ht  (1.549 m)  Wt 165 lb (74.844 kg)  BMI 31.19 kg/m2  SpO2 99%             Review of Systems  Constitutional: Negative.   HENT: Negative for congestion, dental problem, hearing loss, rhinorrhea, sinus pressure, sore throat and tinnitus.   Eyes: Negative for pain, discharge and visual disturbance.  Respiratory: Negative for cough and shortness of breath.   Cardiovascular: Negative for chest pain, palpitations and leg swelling.  Gastrointestinal: Negative for nausea, vomiting, abdominal pain, diarrhea, constipation, blood in stool and abdominal distention.  Genitourinary: Negative for dysuria, urgency, frequency, hematuria, flank pain, vaginal bleeding, vaginal discharge, difficulty urinating, vaginal pain and pelvic pain.  Musculoskeletal: Negative for joint swelling, arthralgias and gait problem.  Skin: Negative for rash.  Neurological: Negative for dizziness, syncope, speech difficulty, weakness, numbness and headaches.  Hematological: Negative for adenopathy.  Psychiatric/Behavioral: Negative for behavioral problems, dysphoric mood and agitation. The patient is not nervous/anxious.        Objective:   Physical Exam    Constitutional: She is oriented to person, place, and time. She appears well-developed and well-nourished.  HENT:  Head: Normocephalic.  Right Ear: External ear normal.  Left Ear: External ear normal.  Mouth/Throat: Oropharynx is clear and moist.  Eyes: Conjunctivae and EOM are normal. Pupils are equal, round, and reactive to light.  Neck: Normal range of motion. Neck supple. No thyromegaly present.  Cardiovascular: Normal rate, regular rhythm, normal heart sounds and intact distal pulses.   Pulmonary/Chest: Effort normal and breath sounds normal.  Abdominal: Soft. Bowel sounds are normal. She exhibits no mass. There is no tenderness.  Musculoskeletal: Normal range of motion.  Lymphadenopathy:    She has no cervical adenopathy.  Neurological: She is alert and oriented to person, place, and time.  Skin: Skin is warm and dry. No rash noted.  Psychiatric: She has a normal mood and affect. Her behavior is normal.          Assessment & Plan:   Diabetes mellitus.  Diet controlled.  We'll check a hemoglobin A1c.  This and the weight loss encouraged Hypertension, stable Allergic rhinitis, stable  CPX 6 months.  Will consider statin therapy at that time

## 2014-10-11 NOTE — Patient Instructions (Signed)
Please check your hemoglobin A1c every 3 months  Please see your eye doctor yearly to check for diabetic eye damage    It is important that you exercise regularly, at least 20 minutes 3 to 4 times per week.  If you develop chest pain or shortness of breath seek  medical attention.  You need to lose weight.  Consider a lower calorie diet and regular exercise.  Return in 6 months for follow-up

## 2014-10-11 NOTE — Progress Notes (Signed)
Pre visit review using our clinic review tool, if applicable. No additional management support is needed unless otherwise documented below in the visit note. 

## 2015-03-06 LAB — HM MAMMOGRAPHY

## 2015-03-13 ENCOUNTER — Encounter: Payer: Self-pay | Admitting: Internal Medicine

## 2015-04-11 ENCOUNTER — Other Ambulatory Visit: Payer: Self-pay | Admitting: Internal Medicine

## 2015-05-15 ENCOUNTER — Ambulatory Visit: Payer: BLUE CROSS/BLUE SHIELD | Admitting: Internal Medicine

## 2015-05-17 ENCOUNTER — Ambulatory Visit (INDEPENDENT_AMBULATORY_CARE_PROVIDER_SITE_OTHER): Payer: BLUE CROSS/BLUE SHIELD | Admitting: Internal Medicine

## 2015-05-17 ENCOUNTER — Telehealth: Payer: Self-pay | Admitting: Internal Medicine

## 2015-05-17 ENCOUNTER — Encounter: Payer: Self-pay | Admitting: Internal Medicine

## 2015-05-17 VITALS — BP 140/80 | HR 72 | Temp 98.0°F | Resp 20 | Ht 61.0 in | Wt 161.0 lb

## 2015-05-17 DIAGNOSIS — I10 Essential (primary) hypertension: Secondary | ICD-10-CM

## 2015-05-17 DIAGNOSIS — E785 Hyperlipidemia, unspecified: Secondary | ICD-10-CM | POA: Diagnosis not present

## 2015-05-17 DIAGNOSIS — E119 Type 2 diabetes mellitus without complications: Secondary | ICD-10-CM | POA: Diagnosis not present

## 2015-05-17 LAB — HEMOGLOBIN A1C: Hgb A1c MFr Bld: 6.7 % — ABNORMAL HIGH (ref 4.6–6.5)

## 2015-05-17 NOTE — Progress Notes (Signed)
Subjective:    Patient ID: Brenda [name], female    DOB: 1945/02/20, 70 y.o.   MRN: [id number]  HPI Lab Results  Component Value Date   HGBA1C 6.5 10/11/2014   Wt Readings from Last 3 Encounters:  05/17/15 161 lb (73.029 kg)  10/11/14 165 lb (74.844 kg)  04/11/14 177 lb (80.287 kg)   BP Readings from Last 3 Encounters:  05/17/15 140/80  10/11/14 140/84  04/11/14 64150/8380   70 year old patient who has a history of essential hypertension.  She also has a history of impaired glucose tolerance.  She has been reluctant to start metformin therapy.  She continues to lose weight and doing quite well.  No concerns or complaints today She does have a history colonic polyps and mild dyslipidemia  Past Medical History  Diagnosis Date  . Chronic rhinitis 01/19/2007  . COLONIC POLYPS, HX OF 06/24/2006  . DIVERTICULOSIS, COLON 11/11/2006  . HYPERLIPIDEMIA 12/20/2008  . HYPERTENSION 06/24/2006  . MENOPAUSAL SYNDROME 06/24/2006  . Impaired glucose tolerance   . Allergy     seasonal     Social History   Social History  . Marital Status: Married    Spouse Name: N/A  . Number of Children: N/A  . Years of Education: N/A   Occupational History  . Not on file.   Social History Main Topics  . Smoking status: Never Smoker   . Smokeless tobacco: Never Used  . Alcohol Use: No  . Drug Use: No  . Sexual Activity: Not on file   Other Topics Concern  . Not on file   Social History Narrative    Past Surgical History  Procedure Laterality Date  . Tubal ligation  1994    benign  . Breast biopsy    . Colonoscopy    . Polypectomy      Family History  Problem Relation Age of Onset  . Mental illness Mother   . Cancer Father     lung  . Parkinsonism Brother   . Colon cancer Neg Hx     No Known Allergies  Current Outpatient Prescriptions on File Prior to Visit  Medication Sig Dispense Refill  . fexofenadine (ALLEGRA ALLERGY) 180 MG tablet Take 180 mg by mouth daily.    Marland Kitchen.  ibuprofen (ADVIL,MOTRIN) 600 MG tablet Take 1 tablet (600 mg total) by mouth every 8 (eight) hours as needed. 30 tablet 0  . lisinopril-hydrochlorothiazide (PRINZIDE,ZESTORETIC) 20-12.5 MG tablet TAKE ONE TABLET BY MOUTH ONCE DAILY 90 tablet 1  . Pseudoephedrine-Ibuprofen (ADVIL COLD/SINUS PO) Take 1 tablet by mouth as needed.    . fluticasone (FLONASE) 50 MCG/ACT nasal spray Place 2 sprays into the nose daily. (Patient taking differently: Place 2 sprays into the nose daily as needed. ) 16 g 1   No current facility-administered medications on file prior to visit.    BP 140/80 mmHg  Pulse 72  Temp(Src) 98 F (36.7 C) (Oral)  Resp 20  Ht 5\' 1"  (1.549 m)  Wt 161 lb (73.029 kg)  BMI 30.44 kg/m2  SpO2 98%     Review of Systems  Constitutional: Negative.   HENT: Negative for congestion, dental problem, hearing loss, rhinorrhea, sinus pressure, sore throat and tinnitus.   Eyes: Negative for pain, discharge and visual disturbance.  Respiratory: Negative for cough and shortness of breath.   Cardiovascular: Negative for chest pain, palpitations and leg swelling.  Gastrointestinal: Negative for nausea, vomiting, abdominal pain, diarrhea, constipation, blood in stool and abdominal distention.  Genitourinary:  Negative for dysuria, urgency, frequency, hematuria, flank pain, vaginal bleeding, vaginal discharge, difficulty urinating, vaginal pain and pelvic pain.  Musculoskeletal: Negative for joint swelling, arthralgias and gait problem.  Skin: Negative for rash.  Neurological: Negative for dizziness, syncope, speech difficulty, weakness, numbness and headaches.  Hematological: Negative for adenopathy.  Psychiatric/Behavioral: Negative for behavioral problems, dysphoric mood and agitation. The patient is not nervous/anxious.        Objective:   Physical Exam  Constitutional: She is oriented to person, place, and time. She appears well-developed and well-nourished.  HENT:  Head:  Normocephalic.  Right Ear: External ear normal.  Left Ear: External ear normal.  Mouth/Throat: Oropharynx is clear and moist.  Eyes: Conjunctivae and EOM are normal. Pupils are equal, round, and reactive to light.  Neck: Normal range of motion. Neck supple. No thyromegaly present.  Cardiovascular: Normal rate, regular rhythm, normal heart sounds and intact distal pulses.   Pulmonary/Chest: Effort normal and breath sounds normal.  Abdominal: Soft. Bowel sounds are normal. She exhibits no mass. There is no tenderness.  Musculoskeletal: Normal range of motion.  Lymphadenopathy:    She has no cervical adenopathy.  Neurological: She is alert and oriented to person, place, and time.  Skin: Skin is warm and dry. No rash noted.  Psychiatric: She has a normal mood and affect. Her behavior is normal.          Assessment & Plan:   Hypertension, controlled Overweight.  Improved Impaired glucose tolerance.  We'll check a hemoglobin A1c Mild dyslipidemia  CPX 6 months No change in medical therapy

## 2015-05-17 NOTE — Telephone Encounter (Signed)
Pt states dr Kirtland Bouchardk asked her and her husband to return for cpe in Oct. They always come in together. Pt asked to come in fasting. No back to back cpe in the am, is it ok to schedule one at 8:15 and the other at 9:30 the same day?  This will be in October.

## 2015-05-17 NOTE — Patient Instructions (Signed)

## 2015-05-18 NOTE — Telephone Encounter (Signed)
Yes, that is fine. 

## 2015-10-04 ENCOUNTER — Ambulatory Visit (INDEPENDENT_AMBULATORY_CARE_PROVIDER_SITE_OTHER): Payer: BLUE CROSS/BLUE SHIELD | Admitting: Internal Medicine

## 2015-10-04 ENCOUNTER — Encounter: Payer: Self-pay | Admitting: Internal Medicine

## 2015-10-04 VITALS — BP 120/80 | HR 76 | Temp 98.1°F | Resp 20 | Ht 59.0 in | Wt 156.0 lb

## 2015-10-04 DIAGNOSIS — Z0000 Encounter for general adult medical examination without abnormal findings: Secondary | ICD-10-CM | POA: Diagnosis not present

## 2015-10-04 DIAGNOSIS — Z23 Encounter for immunization: Secondary | ICD-10-CM

## 2015-10-04 LAB — COMPREHENSIVE METABOLIC PANEL
ALT: 12 U/L (ref 0–35)
AST: 15 U/L (ref 0–37)
Albumin: 4.4 g/dL (ref 3.5–5.2)
Alkaline Phosphatase: 54 U/L (ref 39–117)
BUN: 17 mg/dL (ref 6–23)
CHLORIDE: 101 meq/L (ref 96–112)
CO2: 32 mEq/L (ref 19–32)
Calcium: 9.6 mg/dL (ref 8.4–10.5)
Creatinine, Ser: 0.76 mg/dL (ref 0.40–1.20)
GFR: 96.69 mL/min (ref >60.00)
GLUCOSE: 109 mg/dL — AB (ref 70–99)
POTASSIUM: 3.2 meq/L — AB (ref 3.5–5.1)
SODIUM: 141 meq/L (ref 135–145)
Total Bilirubin: 0.6 mg/dL (ref 0.2–1.2)
Total Protein: 7.3 g/dL (ref 6.0–8.3)

## 2015-10-04 LAB — CBC WITH DIFFERENTIAL/PLATELET
Basophils Absolute: 0 10*3/uL (ref 0.0–0.1)
Basophils Relative: 0.6 % (ref 0.0–3.0)
EOS PCT: 3 % (ref 0.0–5.0)
Eosinophils Absolute: 0.1 10*3/uL (ref 0.0–0.7)
HEMATOCRIT: 42.6 % (ref 36.0–46.0)
Hemoglobin: 14.4 g/dL (ref 12.0–15.0)
LYMPHS ABS: 2.1 10*3/uL (ref 0.7–4.0)
LYMPHS PCT: 42 % (ref 12.0–46.0)
MCHC: 33.9 g/dL (ref 30.0–36.0)
MCV: 89.7 fl (ref 78.0–100.0)
MONOS PCT: 8.1 % (ref 3.0–12.0)
Monocytes Absolute: 0.4 10*3/uL (ref 0.1–1.0)
NEUTROS ABS: 2.3 10*3/uL (ref 1.4–7.7)
NEUTROS PCT: 46.3 % (ref 43.0–77.0)
Platelets: 276 10*3/uL (ref 150.0–400.0)
RBC: 4.75 Mil/uL (ref 3.87–5.11)
RDW: 14.1 % (ref 11.5–15.5)
WBC: 4.9 10*3/uL (ref 4.0–10.5)

## 2015-10-04 LAB — LIPID PANEL
Cholesterol: 213 mg/dL — ABNORMAL HIGH (ref 0–200)
HDL: 42.5 mg/dL (ref >39.00)
LDL CALC: 151 mg/dL — AB (ref 0–99)
NONHDL: 170.94
Total CHOL/HDL Ratio: 5
Triglycerides: 101 mg/dL (ref 0.0–149.0)
VLDL: 20.2 mg/dL (ref 0.0–40.0)

## 2015-10-04 LAB — MICROALBUMIN / CREATININE URINE RATIO
Creatinine,U: 123.7 mg/dL
Microalb Creat Ratio: 0.6 mg/g (ref 0.0–30.0)
Microalb, Ur: <0.7 mg/dL (ref 0.0–1.9)

## 2015-10-04 LAB — TSH: TSH: 0.92 u[IU]/mL (ref 0.35–4.50)

## 2015-10-04 LAB — HEMOGLOBIN A1C: HEMOGLOBIN A1C: 6.5 % (ref 4.6–6.5)

## 2015-10-04 MED ORDER — LISINOPRIL-HYDROCHLOROTHIAZIDE 20-12.5 MG PO TABS: 1.0000 | ORAL_TABLET | Freq: Every day | ORAL | 3 refills | Status: DC

## 2015-10-04 NOTE — Progress Notes (Signed)
Subjective:    Patient ID: Brenda [name], female    DOB: Aug 15, 1945, 70 y.o.   MRN: [id number]  HPI 70 year old patient who is seen today for a wellness exam. She has a history of diet-controlled diabetes.  She continues to lose weight.  She has been very resistant to start metformin therapy.  Hemoglobin A1c's have generally been well controlled. She has a history of essential hypertension that has been well-controlled.  She has a history also of allergic rhinitis.  She has colonic polyps.  Last colonoscopy 2014. She does see ophthalmology annually  Past Medical History:  Diagnosis Date  . Allergy    seasonal  . Chronic rhinitis 01/19/2007  . COLONIC POLYPS, HX OF 06/24/2006  . DIVERTICULOSIS, COLON 11/11/2006  . HYPERLIPIDEMIA 12/20/2008  . HYPERTENSION 06/24/2006  . Impaired glucose tolerance   . MENOPAUSAL SYNDROME 06/24/2006     Social History   Social History  . Marital status: Married    Spouse name: N/A  . Number of children: N/A  . Years of education: N/A   Occupational History  . Not on file.   Social History Main Topics  . Smoking status: Never Smoker  . Smokeless tobacco: Never Used  . Alcohol use No  . Drug use: No  . Sexual activity: Not on file   Other Topics Concern  . Not on file   Social History Narrative  . No narrative on file    Past Surgical History:  Procedure Laterality Date  . BREAST BIOPSY    . COLONOSCOPY    . POLYPECTOMY    . TUBAL LIGATION  1994   benign    Family History  Problem Relation Age of Onset  . Mental illness Mother   . Cancer Father     lung  . Parkinsonism Brother   . Colon cancer Neg Hx     No Known Allergies  Current Outpatient Prescriptions on File Prior to Visit  Medication Sig Dispense Refill  . fexofenadine (ALLEGRA ALLERGY) 180 MG tablet Take 180 mg by mouth daily.    . Pseudoephedrine-Ibuprofen (ADVIL COLD/SINUS PO) Take 1 tablet by mouth as needed.    . Travoprost, BAK Free, (TRAVATAN) 0.004 % SOLN  ophthalmic solution Place 1 drop into both eyes at bedtime.    . fluticasone (FLONASE) 50 MCG/ACT nasal spray Place 2 sprays into the nose daily. (Patient taking differently: Place 2 sprays into the nose daily as needed. ) 16 g 1   No current facility-administered medications on file prior to visit.     BP 120/80 (BP Location: Right Arm, Patient Position: Sitting, Cuff Size: Normal)   Pulse 76   Temp 98.1 F (36.7 C) (Oral)   Resp 20   Ht 4\' 11"  (1.499 m)   Wt 156 lb (70.8 kg)   SpO2 98%   BMI 31.51 kg/m      Review of Systems  Constitutional: Negative for appetite change, fatigue, fever and unexpected weight change.  HENT: Negative for congestion, dental problem, ear pain, hearing loss, mouth sores, nosebleeds, sinus pressure, sore throat, tinnitus, trouble swallowing and voice change.   Eyes: Negative for photophobia, pain, redness and visual disturbance.  Respiratory: Negative for cough, chest tightness and shortness of breath.   Cardiovascular: Negative for chest pain, palpitations and leg swelling.  Gastrointestinal: Negative for abdominal distention, abdominal pain, blood in stool, constipation, diarrhea, nausea, rectal pain and vomiting.  Genitourinary: Negative for difficulty urinating, dysuria, flank pain, frequency, genital sores, hematuria, menstrual problem,  pelvic pain, urgency, vaginal bleeding, vaginal discharge and vaginal pain.  Musculoskeletal: Negative for arthralgias, back pain and neck stiffness.  Skin: Negative for rash.  Neurological: Negative for dizziness, syncope, speech difficulty, weakness, light-headedness, numbness and headaches.  Hematological: Negative for adenopathy. Does not bruise/bleed easily.  Psychiatric/Behavioral: Negative for agitation, behavioral problems, dysphoric mood, self-injury and suicidal ideas. The patient is not nervous/anxious.        Objective:   Physical Exam  Constitutional: She is oriented to person, place, and time. She  appears well-developed and well-nourished.  Blood pressure 120/80 bilaterally  HENT:  Head: Normocephalic and atraumatic.  Right Ear: External ear normal.  Left Ear: External ear normal.  Mouth/Throat: Oropharynx is clear and moist.  Low hanging soft palate  Eyes: Conjunctivae and EOM are normal.  Neck: Normal range of motion. Neck supple. No JVD present. No thyromegaly present.  Cardiovascular: Normal rate, regular rhythm, normal heart sounds and intact distal pulses.   No murmur heard. Pulmonary/Chest: Effort normal and breath sounds normal. She has no wheezes. She has no rales.  Abdominal: Soft. Bowel sounds are normal. She exhibits no distension and no mass. There is no tenderness. There is no rebound and no guarding.  Musculoskeletal: Normal range of motion. She exhibits no edema or tenderness.  Neurological: She is alert and oriented to person, place, and time. She has normal reflexes. No cranial nerve deficit. She exhibits normal muscle tone. Coordination normal.  Skin: Skin is warm and dry. No rash noted.  Psychiatric: She has a normal mood and affect. Her behavior is normal.          Assessment & Plan:   Preventive health care Type 2 diabetes.  Diet controlled.  Will review hemoglobin A1c Essential hypertension, stable History of colonic polyps.  Follow-up colonoscopy 2019  Flu vaccine administered Check updated lab including lipid profile and urine for microalbumin  Recheck 6 months  Rogelia BogaKWIATKOWSKI,PETER FRANK

## 2015-10-04 NOTE — Patient Instructions (Signed)
Limit your sodium (Salt) intake  Please check your blood pressure on a regular basis.  If it is consistently greater than 150/90, please make an office appointment.    It is important that you exercise regularly, at least 20 minutes 3 to 4 times per week.  If you develop chest pain or shortness of breath seek  medical attention.  Return in 6 months for follow-up  

## 2015-10-07 ENCOUNTER — Other Ambulatory Visit: Payer: Self-pay | Admitting: *Deleted

## 2015-10-07 ENCOUNTER — Encounter: Payer: Self-pay | Admitting: Internal Medicine

## 2015-10-07 MED ORDER — POTASSIUM CHLORIDE CRYS ER 20 MEQ PO TBCR: 20.0000 meq | EXTENDED_RELEASE_TABLET | Freq: Every day | ORAL | 0 refills | Status: DC

## 2015-10-16 ENCOUNTER — Other Ambulatory Visit: Payer: Self-pay | Admitting: Internal Medicine

## 2015-10-16 MED ORDER — LISINOPRIL 20 MG PO TABS: 20.0000 mg | ORAL_TABLET | Freq: Every day | ORAL | 3 refills | Status: DC

## 2015-10-27 ENCOUNTER — Encounter: Payer: BLUE CROSS/BLUE SHIELD | Admitting: Internal Medicine

## 2015-12-29 ENCOUNTER — Ambulatory Visit (INDEPENDENT_AMBULATORY_CARE_PROVIDER_SITE_OTHER): Payer: BLUE CROSS/BLUE SHIELD | Admitting: Internal Medicine

## 2015-12-29 ENCOUNTER — Encounter: Payer: Self-pay | Admitting: Internal Medicine

## 2015-12-29 VITALS — BP 138/74 | HR 67 | Temp 98.1°F | Ht 59.0 in | Wt 154.6 lb

## 2015-12-29 DIAGNOSIS — E785 Hyperlipidemia, unspecified: Secondary | ICD-10-CM | POA: Diagnosis not present

## 2015-12-29 DIAGNOSIS — E119 Type 2 diabetes mellitus without complications: Secondary | ICD-10-CM | POA: Diagnosis not present

## 2015-12-29 DIAGNOSIS — I10 Essential (primary) hypertension: Secondary | ICD-10-CM

## 2015-12-29 MED ORDER — LISINOPRIL 20 MG PO TABS: 20.0000 mg | ORAL_TABLET | Freq: Every day | ORAL | 3 refills | Status: DC

## 2015-12-29 NOTE — Progress Notes (Signed)
Subjective:    Patient ID: Brenda [name], female    DOB: Sep 29, 1945, 70 y.o.   MRN: [id number]  HPI  70 year old patient who is seen today for follow-up.  She has a history of impaired glucose tolerance and he will and A1c 3 months ago was 6.5 She has essential hypertension.  This has been well-controlled off diuretic therapy, which was discontinued due to hypokalemia.  She has lost over 20 pounds in the last year and a half Doing quite well without concerns or complaints  Past Medical History:  Diagnosis Date  . Allergy    seasonal  . Chronic rhinitis 01/19/2007  . COLONIC POLYPS, HX OF 06/24/2006  . DIVERTICULOSIS, COLON 11/11/2006  . HYPERLIPIDEMIA 12/20/2008  . HYPERTENSION 06/24/2006  . Impaired glucose tolerance   . MENOPAUSAL SYNDROME 06/24/2006     Social History   Social History  . Marital status: Married    Spouse name: N/A  . Number of children: N/A  . Years of education: N/A   Occupational History  . Not on file.   Social History Main Topics  . Smoking status: Never Smoker  . Smokeless tobacco: Never Used  . Alcohol use No  . Drug use: No  . Sexual activity: Not on file   Other Topics Concern  . Not on file   Social History Narrative  . No narrative on file    Past Surgical History:  Procedure Laterality Date  . BREAST BIOPSY    . COLONOSCOPY    . POLYPECTOMY    . TUBAL LIGATION  1994   benign    Family History  Problem Relation Age of Onset  . Mental illness Mother   . Cancer Father     lung  . Parkinsonism Brother   . Colon cancer Neg Hx     No Known Allergies  Current Outpatient Prescriptions on File Prior to Visit  Medication Sig Dispense Refill  . fexofenadine (ALLEGRA ALLERGY) 180 MG tablet Take 180 mg by mouth daily.    . Pseudoephedrine-Ibuprofen (ADVIL COLD/SINUS PO) Take 1 tablet by mouth as needed.    . Travoprost, BAK Free, (TRAVATAN) 0.004 % SOLN ophthalmic solution Place 1 drop into both eyes at bedtime.     No current  facility-administered medications on file prior to visit.     BP 138/74 (BP Location: Right Arm, Patient Position: Sitting, Cuff Size: Normal)   Pulse 67   Temp 98.1 F (36.7 C) (Oral)   Ht 4\' 11"  (1.499 m)   Wt 154 lb 9.6 oz (70.1 kg)   SpO2 98%   BMI 31.23 kg/m     Review of Systems  Constitutional: Negative.   HENT: Negative for congestion, dental problem, hearing loss, rhinorrhea, sinus pressure, sore throat and tinnitus.   Eyes: Negative for pain, discharge and visual disturbance.  Respiratory: Negative for cough and shortness of breath.   Cardiovascular: Negative for chest pain, palpitations and leg swelling.  Gastrointestinal: Negative for abdominal distention, abdominal pain, blood in stool, constipation, diarrhea, nausea and vomiting.  Genitourinary: Negative for difficulty urinating, dysuria, flank pain, frequency, hematuria, pelvic pain, urgency, vaginal bleeding, vaginal discharge and vaginal pain.  Musculoskeletal: Negative for arthralgias, gait problem and joint swelling.  Skin: Negative for rash.  Neurological: Negative for dizziness, syncope, speech difficulty, weakness, numbness and headaches.  Hematological: Negative for adenopathy.  Psychiatric/Behavioral: Negative for agitation, behavioral problems and dysphoric mood. The patient is not nervous/anxious.        Objective:  Physical Exam  Constitutional: She is oriented to person, place, and time. She appears well-developed and well-nourished.  HENT:  Head: Normocephalic.  Right Ear: External ear normal.  Left Ear: External ear normal.  Mouth/Throat: Oropharynx is clear and moist.  Eyes: Conjunctivae and EOM are normal. Pupils are equal, round, and reactive to light.  Neck: Normal range of motion. Neck supple. No thyromegaly present.  Cardiovascular: Normal rate, regular rhythm, normal heart sounds and intact distal pulses.   Pulmonary/Chest: Effort normal and breath sounds normal.  Abdominal: Soft. Bowel  sounds are normal. She exhibits no mass. There is no tenderness.  Musculoskeletal: Normal range of motion.  Lymphadenopathy:    She has no cervical adenopathy.  Neurological: She is alert and oriented to person, place, and time.  Skin: Skin is warm and dry. No rash noted.  Psychiatric: She has a normal mood and affect. Her behavior is normal.          Assessment & Plan:   Essential hypertension.  Blood pressure well controlled on ace inhibition alone Impaired glucose tolerance Voluntary weight loss  No change in medical regimen Follow-up 6 months  Jheremy Boger Homero FellersFRANK

## 2015-12-29 NOTE — Patient Instructions (Signed)
Limit your sodium (Salt) intake  Please check your blood pressure on a regular basis.  If it is consistently greater than 150/90, please make an office appointment.  Please check your blood pressure on a regular basis.  If it is consistently greater than 150/90, please make an office appointment.  Return in 6 months for follow-up

## 2015-12-29 NOTE — Progress Notes (Signed)
Pre visit review using our clinic review tool, if applicable. No additional management support is needed unless otherwise documented below in the visit note. 

## 2016-01-03 ENCOUNTER — Encounter: Payer: Self-pay | Admitting: Internal Medicine

## 2016-01-18 IMAGING — US US SOFT TISSUE HEAD/NECK
1 series · 14 of 25 positions shown · non-contrast
Comparison: None.

CLINICAL DATA: Thyroid goiter

EXAM:
THYROID ULTRASOUND
TECHNIQUE: Ultrasound examination of the thyroid gland and adjacent soft
tissues was performed.

[Series 1: us soft tissue head/neck · 0.08mm/px · 14 of 53 slices shown]
[im 1/53]
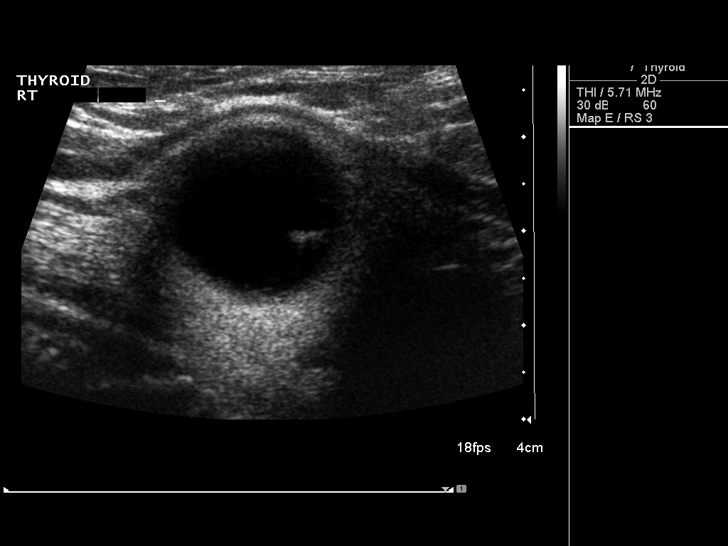
[im 5/53]
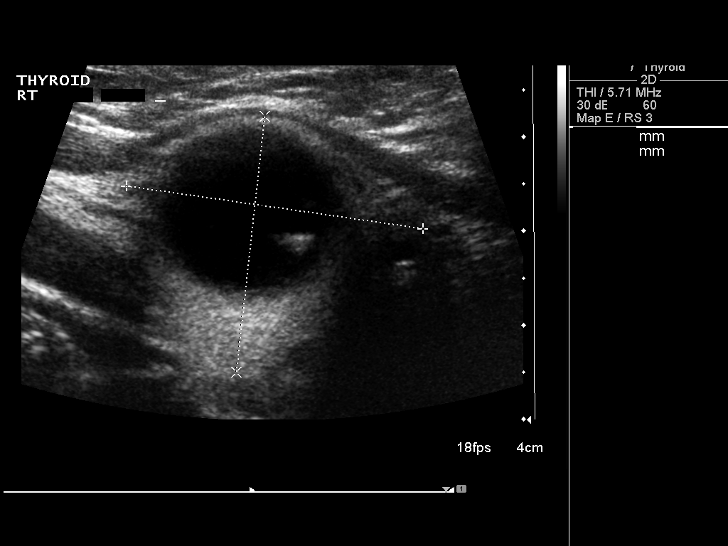
[im 9/53]
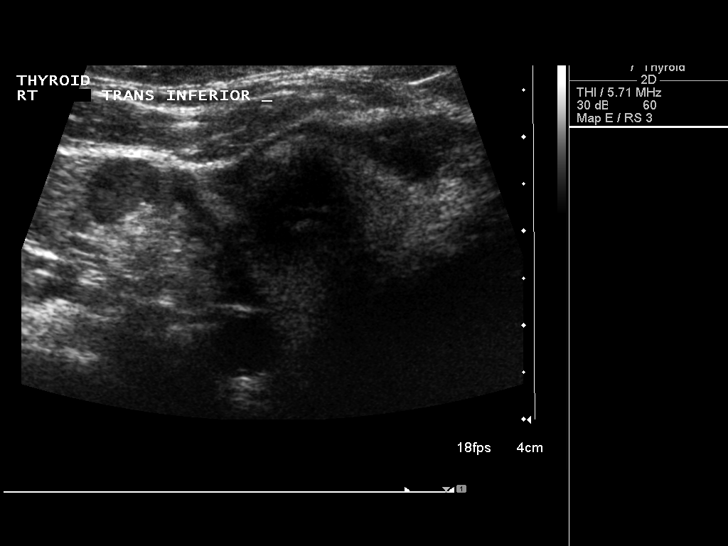
[im 14/53]
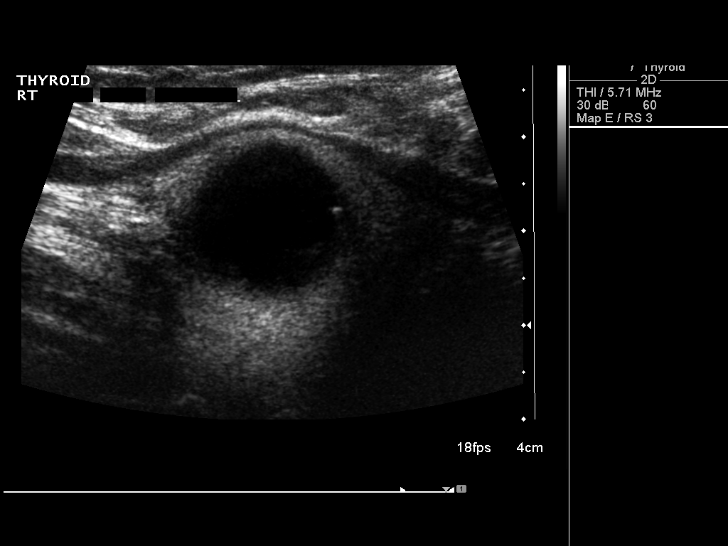
[im 18/53]
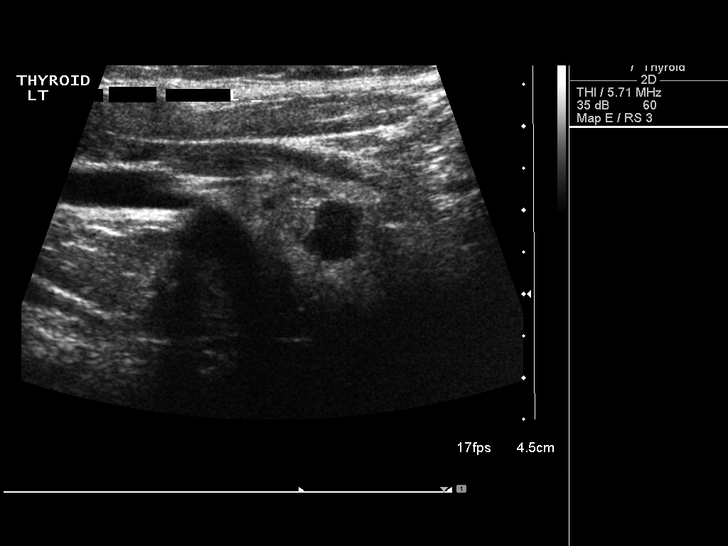
[im 20/53]
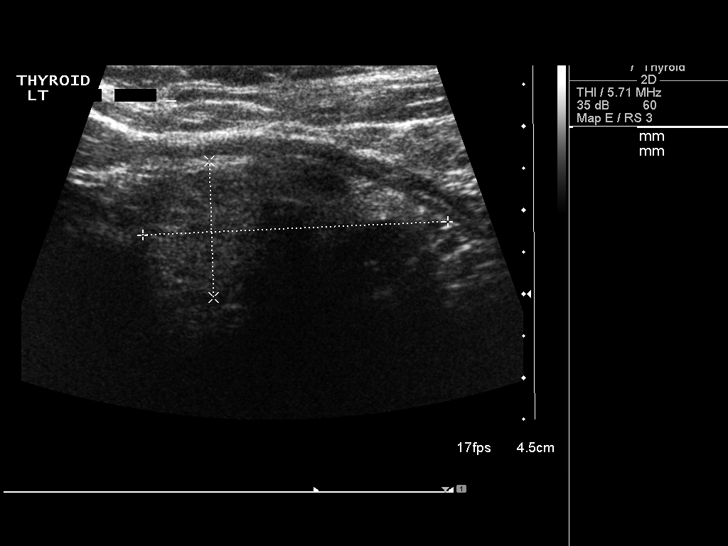
[im 24/53]
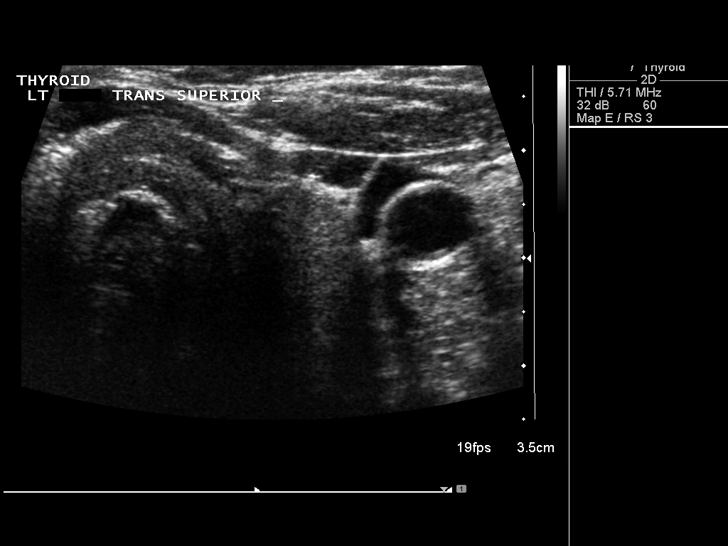
[im 29/53]
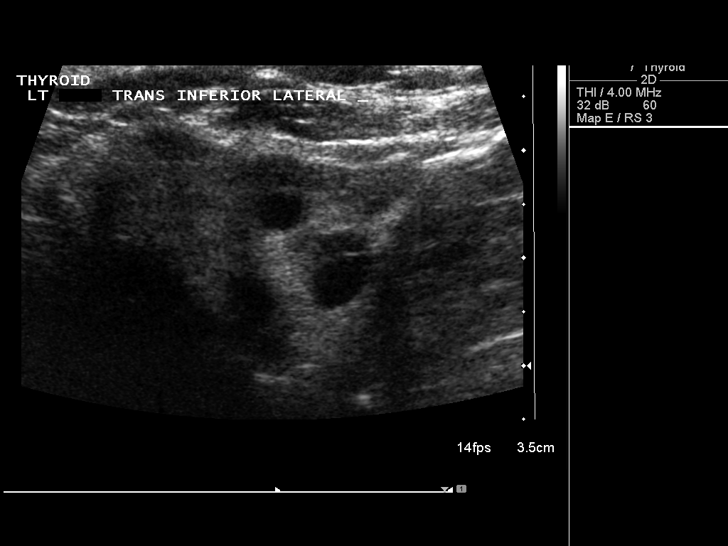
[im 33/53]
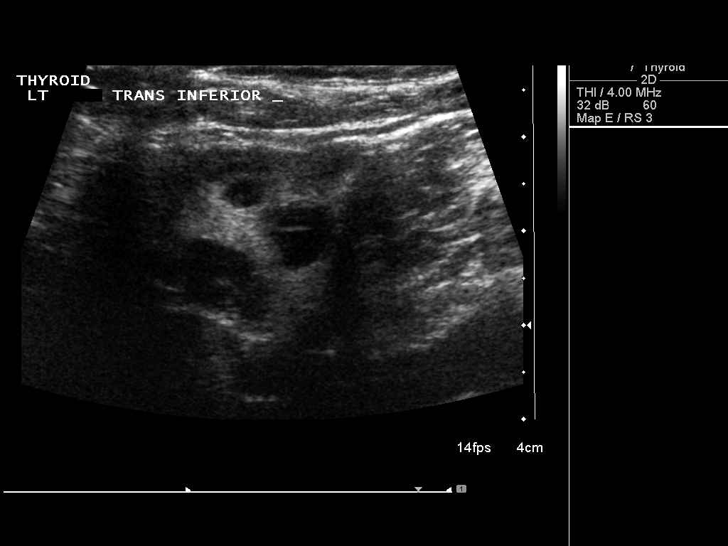
[im 35/53]
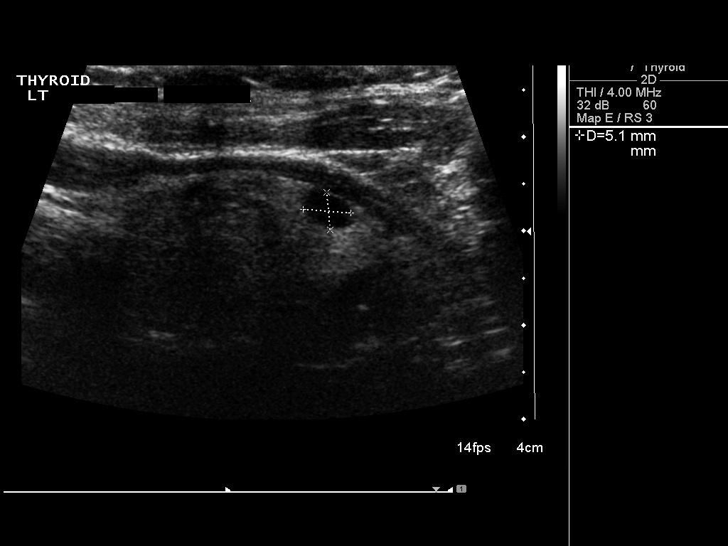
[im 40/53]
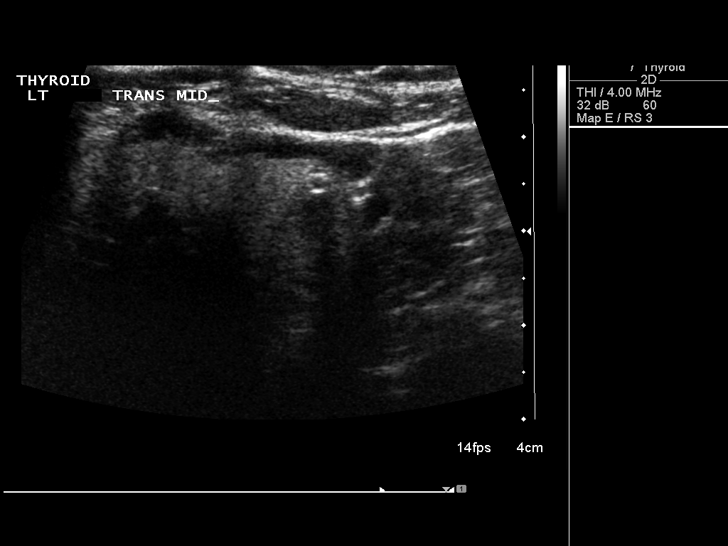
[im 44/53]
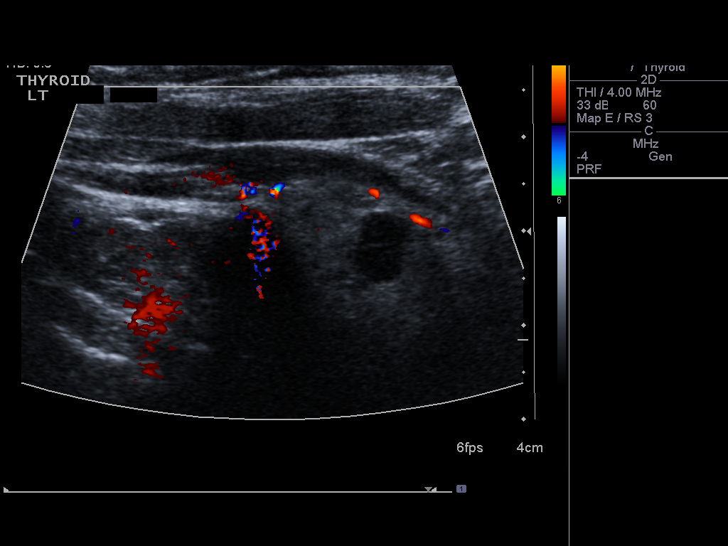
[im 48/53]
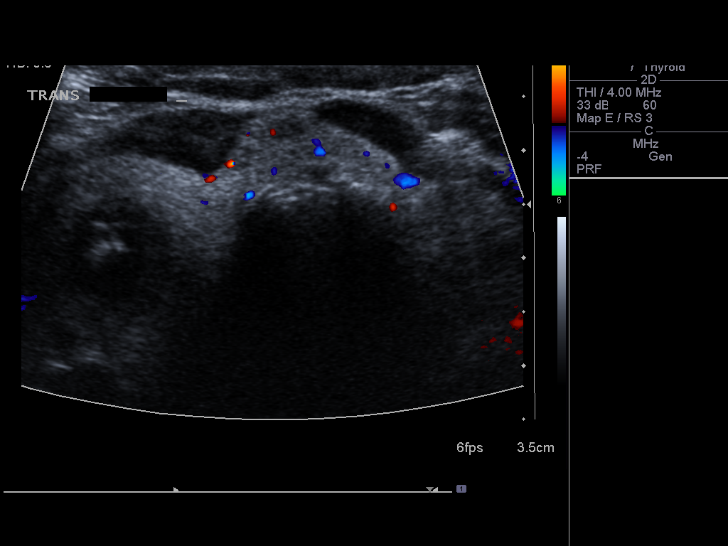
[im 53/53]
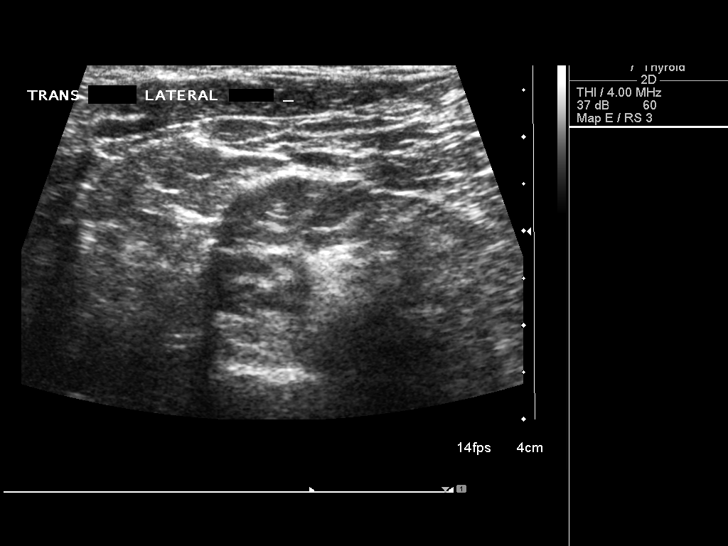

[14 of 25 positions shown; findings below may reference images not displayed]

FINDINGS: Right thyroid lobe

Measurements: 3.5 x 2.9 x 2.4 cm.. There is a hypoechoic nodule with
foci of echogenicity within the mid upper right lobe of 2.0 x 1.9 x
2.0 cm. Followup ultrasound of the thyroid in 1 year is recommended.
There does appear to be good through transmission indicating this
lesion is primarily cystic.

Left thyroid lobe

Measurements: 3.7 x 1.7 x 1.3 cm.. Small primarily cystic nodules
are scattered throughout the left lobe of no more than 10 mm in
diameter.

Isthmus

Thickness: 6 mm in thickness..  No nodules visualized.

Lymphadenopathy

None visualized.
IMPRESSION: There are nodules bilaterally the largest in the right mid lobe all
which are primarily hypoechoic and most likely cystic. Consider
followup ultrasound of the thyroid in 1 year.

## 2016-03-17 ENCOUNTER — Other Ambulatory Visit: Payer: Self-pay | Admitting: Internal Medicine

## 2016-03-28 DIAGNOSIS — H401131 Primary open-angle glaucoma, bilateral, mild stage: Secondary | ICD-10-CM | POA: Diagnosis not present

## 2016-04-15 DIAGNOSIS — Z1231 Encounter for screening mammogram for malignant neoplasm of breast: Secondary | ICD-10-CM | POA: Diagnosis not present

## 2016-04-15 LAB — HM MAMMOGRAPHY

## 2016-04-16 ENCOUNTER — Encounter: Payer: Self-pay | Admitting: Family Medicine

## 2016-06-10 ENCOUNTER — Ambulatory Visit (INDEPENDENT_AMBULATORY_CARE_PROVIDER_SITE_OTHER): Payer: PPO | Admitting: Family Medicine

## 2016-06-10 ENCOUNTER — Encounter: Payer: Self-pay | Admitting: Family Medicine

## 2016-06-10 VITALS — BP 158/86 | HR 96 | Temp 98.2°F | Wt 157.8 lb

## 2016-06-10 DIAGNOSIS — I10 Essential (primary) hypertension: Secondary | ICD-10-CM | POA: Diagnosis not present

## 2016-06-10 NOTE — Patient Instructions (Addendum)
It was a pleasure to meet you today! Please obtain an arm blood pressure cuff and monitor your blood pressure once daily, document your readings, and bring those readings with you to your follow up with Dr. Kirtland Bouchard next month or sooner if you have blood pressure that is consistently above 150/90.  One example of a good Blood pressure cuff is an Omron.   How to Take Your Blood Pressure You can take your blood pressure at home with a machine. You may need to check your blood pressure at home:  To check if you have high blood pressure (hypertension).  To check your blood pressure over time.  To make sure your blood pressure medicine is working. Supplies needed: You will need a blood pressure machine, or monitor. You can buy one at a drugstore or online. When choosing one:  Choose one with an arm cuff.  Choose one that wraps around your upper arm. Only one finger should fit between your arm and the cuff.  Do not choose one that measures your blood pressure from your wrist or finger. Your doctor can suggest a monitor. How to prepare Avoid these things for 30 minutes before checking your blood pressure:  Drinking caffeine.  Drinking alcohol.  Eating.  Smoking.  Exercising. Five minutes before checking your blood pressure:  Pee.  Sit in a dining chair. Avoid sitting in a soft couch or armchair.  Be quiet. Do not talk. How to take your blood pressure Follow the instructions that came with your machine. If you have a digital blood pressure monitor, these may be the instructions: 1. Sit up straight. 2. Place your feet on the floor. Do not cross your ankles or legs. 3. Rest your left arm at the level of your heart. You may rest it on a table, desk, or chair. 4. Pull up your shirt sleeve. 5. Wrap the blood pressure cuff around the upper part of your left arm. The cuff should be 1 inch (2.5 cm) above your elbow. It is best to wrap the cuff around bare skin. 6. Fit the cuff snugly around  your arm. You should be able to place only one finger between the cuff and your arm. 7. Put the cord inside the groove of your elbow. 8. Press the power button. 9. Sit quietly while the cuff fills with air and loses air. 10. Write down the numbers on the screen. 11. Wait 2-3 minutes and then repeat steps 1-10. What do the numbers mean? Two numbers make up your blood pressure. The first number is called systolic pressure. The second is called diastolic pressure. An example of a blood pressure reading is "120 over 80" (or 120/80). If you are an adult and do not have a medical condition, use this guide to find out if your blood pressure is normal: Normal   First number: below 120.  Second number: below 80. Elevated   First number: 120-129.  Second number: below 80. Hypertension stage 1   First number: 130-139.  Second number: 80-89. Hypertension stage 2   First number: 140 or above.  Second number: 90 or above. Your blood pressure is above normal even if only the top or bottom number is above normal. Follow these instructions at home:  Check your blood pressure as often as your doctor tells you to.  Take your monitor to your next doctor's appointment. Your doctor will:  Make sure you are using it correctly.  Make sure it is working right.  Make sure  you understand what your blood pressure numbers should be.  Tell your doctor if your medicines are causing side effects. Contact a doctor if:  Your blood pressure keeps being high. Get help right away if:  Your first blood pressure number is higher than 180.  Your second blood pressure number is higher than 120. This information is not intended to replace advice given to you by your health care provider. Make sure you discuss any questions you have with your health care provider. Document Released: 12/21/2007 Document Revised: 12/06/2015 Document Reviewed: 06/16/2015 Elsevier Interactive Patient Education  2017 Elsevier  Inc.   WE NOW OFFER   [facility] FAST TRACK!!!  SAME DAY Appointments for ACUTE CARE  Such as: Sprains, Injuries, cuts, abrasions, rashes, muscle pain, joint pain, back pain Colds, flu, sore throats, headache, allergies, cough, fever  Ear pain, sinus and eye infections Abdominal pain, nausea, vomiting, diarrhea, upset stomach Animal/insect bites  3 Easy Ways to Schedule: Walk-In Scheduling Call in scheduling Mychart Sign-up: [email or web address]

## 2016-06-10 NOTE — Progress Notes (Signed)
Subjective:    Patient ID: Brenda [name], female    DOB: Feb 20, 1945, 71 y.o.   MRN: [id number]  HPI  Brenda [name] is a 71 year old female who presents today for evaluation of BP after seeing her dentist which noted  elevated readings of 192/91 in her right arm and 191/103 in her left arm. At the dental office, patient reports that the type of measuring device used was a wrist cuff at the dental office. She also reports checking her BP at [employer] after leaving dental office because she was feeling "stressed" about her BP readings. The reading in [employer] was elevated also and noted to have a diastolic as 111. She reports increased anxiety with this and also notes anxiety when taking care of her husband. She is currently taking lisinopril 20 mg daily without adverse effects.  She denies chest pain, palpitations, SOB, numbness, tingling, weakness, headaches, or edema. She does not monitor her BP at home and reports previously checking her BP at different pharmacies in the same day and readings varied greatly so she discontinued this practice. She monitors her salt intake.  Review of Systems  Constitutional: Negative for chills, fatigue and fever.  Eyes: Negative for visual disturbance.  Respiratory: Negative for cough, shortness of breath and wheezing.   Cardiovascular: Negative for chest pain and palpitations.  Gastrointestinal: Negative for abdominal pain, constipation, diarrhea, nausea and vomiting.  Musculoskeletal: Negative for myalgias.  Neurological: Negative for dizziness, weakness, light-headedness and headaches.  Psychiatric/Behavioral:       Denies depressed mood; Reports anxiety has improved as she experienced situational anxiety while at dental office today.   Past Medical History:  Diagnosis Date  . Allergy    seasonal  . Chronic rhinitis 01/19/2007  . COLONIC POLYPS, HX OF 06/24/2006  . DIVERTICULOSIS, COLON 11/11/2006  . HYPERLIPIDEMIA 12/20/2008  . HYPERTENSION 06/24/2006   . Impaired glucose tolerance   . MENOPAUSAL SYNDROME 06/24/2006     Social History   Social History  . Marital status: Married    Spouse name: N/A  . Number of children: N/A  . Years of education: N/A   Occupational History  . Not on file.   Social History Main Topics  . Smoking status: Never Smoker  . Smokeless tobacco: Never Used  . Alcohol use No  . Drug use: No  . Sexual activity: Not on file   Other Topics Concern  . Not on file   Social History Narrative  . No narrative on file    Past Surgical History:  Procedure Laterality Date  . BREAST BIOPSY    . COLONOSCOPY    . POLYPECTOMY    . TUBAL LIGATION  1994   benign    Family History  Problem Relation Age of Onset  . Mental illness Mother   . Cancer Father        lung  . Parkinsonism Brother   . Colon cancer Neg Hx     No Known Allergies  Current Outpatient Prescriptions on File Prior to Visit  Medication Sig Dispense Refill  . fexofenadine (ALLEGRA ALLERGY) 180 MG tablet Take 180 mg by mouth daily.    Marland Kitchen lisinopril (PRINIVIL,ZESTRIL) 20 MG tablet Take 1 tablet (20 mg total) by mouth daily. 90 tablet 3  . Pseudoephedrine-Ibuprofen (ADVIL COLD/SINUS PO) Take 1 tablet by mouth as needed.    . Travoprost, BAK Free, (TRAVATAN) 0.004 % SOLN ophthalmic solution Place 1 drop into both eyes at bedtime.    Marland Kitchen lisinopril-hydrochlorothiazide (  PRINZIDE,ZESTORETIC) 20-12.5 MG tablet TAKE ONE TABLET BY MOUTH EVERY DAY (Patient not taking: Reported on 06/10/2016) 90 tablet 3   No current facility-administered medications on file prior to visit.     BP (!) 158/86 (BP Location: Right Arm)   Pulse 96   Temp 98.2 F (36.8 C) (Oral)   Wt 157 lb 12.8 oz (71.6 kg)   SpO2 99%   BMI 31.87 kg/m       Objective:   Physical Exam  Constitutional: She is oriented to person, place, and time. She appears well-developed and well-nourished.  Eyes: Pupils are equal, round, and reactive to light. No scleral icterus.  Neck:  Neck supple.  Cardiovascular: Normal rate, regular rhythm and intact distal pulses.   Pulmonary/Chest: Effort normal and breath sounds normal. She has no wheezes. She has no rales.  Musculoskeletal: She exhibits no edema.  Lymphadenopathy:    She has no cervical adenopathy.  Neurological: She is alert and oriented to person, place, and time.  Skin: Skin is warm and dry. No rash noted.  Psychiatric: She has a normal mood and affect. Her behavior is normal. Judgment and thought content normal.       Assessment & Plan:  1. Essential hypertension Retake of BP is reassuring and BP is lowering.  Readings reported using a wrist cuff and variations in those among differing pharmacies may be related to type of cuff, method of obtaining blood pressure, and stress/anxiety from obtaining multiple readings. Advised her to obtain an arm BP cuff and begin monitoring her BP at home daily, record readings, and follow up with her PCP in one month as scheduled or sooner if BP is noted to be above 150/90. She voiced understanding and agreed with plan.  Roddie McJulia Hassen Bruun, FNP-C

## 2016-06-11 ENCOUNTER — Encounter: Payer: Self-pay | Admitting: Family Medicine

## 2016-06-11 NOTE — Telephone Encounter (Signed)
Amil AmenJulia - Please advise. Thanks!

## 2016-06-13 ENCOUNTER — Encounter: Payer: Self-pay | Admitting: Family Medicine

## 2016-06-13 ENCOUNTER — Telehealth: Payer: Self-pay | Admitting: Internal Medicine

## 2016-06-13 ENCOUNTER — Ambulatory Visit (INDEPENDENT_AMBULATORY_CARE_PROVIDER_SITE_OTHER): Payer: PPO | Admitting: Family Medicine

## 2016-06-13 VITALS — BP 168/94 | HR 92 | Temp 98.4°F | Wt 157.4 lb

## 2016-06-13 DIAGNOSIS — I10 Essential (primary) hypertension: Secondary | ICD-10-CM

## 2016-06-13 MED ORDER — LISINOPRIL-HYDROCHLOROTHIAZIDE 20-12.5 MG PO TABS: 1.0000 | ORAL_TABLET | Freq: Every day | ORAL | 3 refills | Status: DC

## 2016-06-13 NOTE — Progress Notes (Signed)
Subjective:    Patient ID: Brenda [name], female    DOB: [birth year], 71 y.o.   MRN: [id number]  HPI  Ms. Loletta ParishWaynick is 71 year old female who presents today for evaluation of BP after monitoring her BP at home this week.  She was evaluated on 06/10/16 for an elevated BP that was found at her dental provider's office. The lowest BP reading after several were taken that day was 158/86, however readings with diastolic in the low 100s and systolic readings in the 180s were noted with readings at her dental office of 192/91 and 191/103.  At home readings with cuff that she brought to the office today are systolic ranges of 170s to 180s and diastolic ranges from 90 to 110. Today, BP is 188/98.  She is currently lisinopril 20 mg daily which has maintained her BP. Previously she was taking lisinopril-HCTZ and had a mildly low potassium for which she was prescribed a potassium supplement. She then asked her PCP if the diuretic component could be the source of the potassium and a trial of lisinopril alone was initiated as she was not interested in potassium long term. Lisinopril maintained her up until now.  She denies chest pain, palpitations, SOB, numbness, tingling, weakness, headaches, or edema. She reports feeling increased anxiety and stress when caring for her husband. She monitors her salt intake.  Review of Systems  Constitutional: Negative for chills, fatigue and fever.  Eyes: Negative for visual disturbance.  Respiratory: Negative for cough, shortness of breath and wheezing.   Cardiovascular: Negative for chest pain and palpitations.  Gastrointestinal: Negative for abdominal pain, constipation, diarrhea, nausea and vomiting.  Skin: Negative for rash.  Neurological: Negative for dizziness, light-headedness and headaches.  Psychiatric/Behavioral:       Denies depressed or anxious mood at this time; reports anxiety at times.   Past Medical History:  Diagnosis Date  . Allergy    seasonal  .  Chronic rhinitis 01/19/2007  . COLONIC POLYPS, HX OF 06/24/2006  . DIVERTICULOSIS, COLON 11/11/2006  . HYPERLIPIDEMIA 12/20/2008  . HYPERTENSION 06/24/2006  . Impaired glucose tolerance   . MENOPAUSAL SYNDROME 06/24/2006     Social History   Social History  . Marital status: Married    Spouse name: N/A  . Number of children: N/A  . Years of education: N/A   Occupational History  . Not on file.   Social History Main Topics  . Smoking status: Never Smoker  . Smokeless tobacco: Never Used  . Alcohol use No  . Drug use: No  . Sexual activity: Not on file   Other Topics Concern  . Not on file   Social History Narrative  . No narrative on file    Past Surgical History:  Procedure Laterality Date  . BREAST BIOPSY    . COLONOSCOPY    . POLYPECTOMY    . TUBAL LIGATION  1994   benign    Family History  Problem Relation Age of Onset  . Mental illness Mother   . Cancer Father        lung  . Parkinsonism Brother   . Colon cancer Neg Hx     No Known Allergies  Current Outpatient Prescriptions on File Prior to Visit  Medication Sig Dispense Refill  . fexofenadine (ALLEGRA ALLERGY) 180 MG tablet Take 180 mg by mouth daily.    Marland Kitchen. lisinopril (PRINIVIL,ZESTRIL) 20 MG tablet Take 1 tablet (20 mg total) by mouth daily. 90 tablet 3  . Pseudoephedrine-Ibuprofen (  ADVIL COLD/SINUS PO) Take 1 tablet by mouth as needed.    . Travoprost, BAK Free, (TRAVATAN) 0.004 % SOLN ophthalmic solution Place 1 drop into both eyes at bedtime.     No current facility-administered medications on file prior to visit.     BP (!) 188/98 (BP Location: Right Arm, Patient Position: Sitting, Cuff Size: Normal)   Pulse 92   Temp 98.4 F (36.9 C) (Oral)   Wt 157 lb 6.4 oz (71.4 kg)   SpO2 98%   BMI 31.79 kg/m       Objective:   Physical Exam  Constitutional: She is oriented to person, place, and time. She appears well-developed and well-nourished.  Eyes: Pupils are equal, round, and reactive to  light.  Neck: Neck supple.  Cardiovascular: Normal rate, regular rhythm and intact distal pulses.   Pulmonary/Chest: Effort normal and breath sounds normal. She has no wheezes. She has no rales.  Abdominal: Soft. Bowel sounds are normal. There is no tenderness.  Musculoskeletal: She exhibits no edema.  Neurological: She is alert and oriented to person, place, and time.  Skin: Skin is warm and dry. No rash noted.  Psychiatric: She has a normal mood and affect. Her behavior is normal. Judgment and thought content normal.      Assessment & Plan:  1. Hypertension, essential Recheck of BP 168/94. Reviewed options to decrease BP. Will resume lisinopril-HCTZ and check potassium level today; advised her to increase foods that contain potassium and a supplement will be considered based upon results. We also discussed that she may need potassium daily with this medication however it is important to decrease BP. Advised monitoring of BP, document readings, and bring to her scheduled follow up next week for evaluation. Reinforced directions of stopping lisinopril that she is currently taking and start with lisinopril-HCTZ as both are not needed. If BP is not maintained we discussed another option of amlodipine.  - lisinopril-hydrochlorothiazide (PRINZIDE,ZESTORETIC) 20-12.5 MG tablet; Take 1 tablet by mouth daily.  Dispense: 90 tablet; Refill: 3 - Potassium  Roddie Mc, FNP-C

## 2016-06-13 NOTE — Telephone Encounter (Signed)
Pt calling to let you know that she purchased a blood pressure cup and her Bp is still rising.  Readings are (sitting with L arm) 5/22   172/92 pulse 85(6:40 am)  183/109 (9:30 am)  172/91 (4:00 pm)  5/23  183/101 pulse 95 (7:00 am)  5/24  189/104  Pulse 105 (8:00 am)   Pt would like to know if she should keep the appointment on Tues 06/18/16 with Dr. Kirtland Bouchard"K"

## 2016-06-13 NOTE — Patient Instructions (Addendum)
Please monitor blood pressure daily for one week and bring readings with you to your next visit. Also, we will check your potassium today and please eat foods with potassium when taking your medication.  Follow up next week for evaluation of your blood pressure.   High potassium content foods  Highest content (>25 meq/100 g) High content (>6.2 meq/100 g)   Vegetables   Spinach   Tomatoes   Broccoli   Winter squash   Beets   Carrots   Cauliflower   Potatoes   Fruits   Bananas  Dried figs Cantaloupe  Molasses Kiwis  Seaweed Oranges  Very high content (>12.5 meq/100 g) Mangos  Dried fruits (dates, prunes) Meats  Nuts Ground beef  Avocados Steak  Bran cereals Pork  Wheat germ Veal  Lima beans Lamb   We have ordered labs or studies at this visit. It can take up to 1-2 weeks for results and processing. IF results require follow up or explanation, we will call you with instructions. Clinically stable results will be released to your [facility]. If you have not heard from us or cannot find your results in [facility] in 2 weeks please contact our office at [phone].  If you are not yet signed up for [facility], please consider signing up     WE NOW OFFER   [facility] FAST TRACK!!!  SAME DAY Appointments for ACUTE CARE  Such as: Sprains, Injuries, cuts, abrasions, rashes, muscle pain, joint pain, back pain Colds, flu, sore throats, headache, allergies, cough, fever  Ear pain, sinus and eye infections Abdominal pain, nausea, vomiting, diarrhea, upset stomach Animal/insect bites  3 Easy Ways to Schedule: Walk-In Scheduling Call in scheduling Mychart Sign-up: [email or web address]

## 2016-06-13 NOTE — Telephone Encounter (Signed)
Spoke with patient scheduled to see Amil AmenJulia at 4.15pm for her blood pressure check.

## 2016-06-14 ENCOUNTER — Emergency Department (HOSPITAL_COMMUNITY): Payer: PPO

## 2016-06-14 ENCOUNTER — Encounter (HOSPITAL_COMMUNITY): Payer: Self-pay | Admitting: Emergency Medicine

## 2016-06-14 ENCOUNTER — Emergency Department (HOSPITAL_COMMUNITY)
Admission: EM | Admit: 2016-06-14 | Discharge: 2016-06-14 | Disposition: A | Discharge status: Home / Self Care | Payer: PPO | Attending: Emergency Medicine | Admitting: Emergency Medicine

## 2016-06-14 DIAGNOSIS — E119 Type 2 diabetes mellitus without complications: Secondary | ICD-10-CM | POA: Diagnosis not present

## 2016-06-14 DIAGNOSIS — I10 Essential (primary) hypertension: Secondary | ICD-10-CM | POA: Diagnosis not present

## 2016-06-14 LAB — CBC
HCT: 43.3 % (ref 36.0–46.0)
HEMOGLOBIN: 15.1 g/dL — AB (ref 12.0–15.0)
MCH: 31 pg (ref 26.0–34.0)
MCHC: 34.9 g/dL (ref 30.0–36.0)
MCV: 88.9 fL (ref 78.0–100.0)
Platelets: 250 10*3/uL (ref 150–400)
RBC: 4.87 MIL/uL (ref 3.87–5.11)
RDW: 13.1 % (ref 11.5–15.5)
WBC: 7.3 10*3/uL (ref 4.0–10.5)

## 2016-06-14 LAB — BASIC METABOLIC PANEL
BUN: 14 mg/dL (ref 6–23)
CALCIUM: 9.9 mg/dL (ref 8.4–10.5)
CO2: 30 mEq/L (ref 19–32)
Chloride: 104 mEq/L (ref 96–112)
Creatinine, Ser: 0.79 mg/dL (ref 0.40–1.20)
GFR: 92.28 mL/min (ref >60.00)
Glucose, Bld: 119 mg/dL — ABNORMAL HIGH (ref 70–99)
POTASSIUM: 3.8 meq/L (ref 3.5–5.1)
SODIUM: 143 meq/L (ref 135–145)

## 2016-06-14 LAB — URINALYSIS, ROUTINE W REFLEX MICROSCOPIC
BILIRUBIN URINE: NEGATIVE
Bacteria, UA: NONE SEEN
Glucose, UA: NEGATIVE mg/dL
Ketones, ur: NEGATIVE mg/dL
Leukocytes, UA: NEGATIVE
NITRITE: NEGATIVE
PROTEIN: NEGATIVE mg/dL
Specific Gravity, Urine: 1.004 — ABNORMAL LOW (ref 1.005–1.030)
pH: 6 (ref 5.0–8.0)

## 2016-06-14 LAB — I-STAT TROPONIN, ED: TROPONIN I, POC: 0 ng/mL (ref 0.00–0.08)

## 2016-06-14 LAB — POTASSIUM: Potassium: 3.7 mEq/L (ref 3.5–5.1)

## 2016-06-14 NOTE — Discharge Instructions (Signed)
As discussed, make sure that you relax and sit still prior to taking your blood pressure at home, do not eat, smoke, exercise, drink alcohol just before taking your blood pressure. Monitor your salt intake and reduce sodium in your diet. It will take some time for your new blood pressure medicine to start working.  Follow up with your primary care provider. Return to the ER if you experience any worsening symptoms, such as bad headache, dizziness, weakness, chest pain, shortness of breath or any other new concerning symptoms in the meantime.

## 2016-06-14 NOTE — ED Notes (Signed)
ED Provider at bedside. 

## 2016-06-14 NOTE — Telephone Encounter (Signed)
Called patient to review her potassium results. Potassium is WNL and she would like to try increasing her food with potassium before initiating a daily potassium supplement. Advised her to review foods that are high in potassium that was provided to her yesterday and reinforced the importance of follow up in 4 days for evaluation of BP. We discussed that potassium daily supplement may be needed long term however with low dose of 12.5 HCTZ she may do well with dietary changes. She has an appointment, voiced understanding and agreed with plan.

## 2016-06-14 NOTE — ED Notes (Signed)
Delay in discharge- pt inquiring about potassium pills

## 2016-06-14 NOTE — Addendum Note (Signed)
Addended by: Charna ElizabethLEMMONS, STEPHANIE L on: 06/14/2016 12:58 PM   Modules accepted: Orders

## 2016-06-14 NOTE — ED Provider Notes (Signed)
[facility]-EMERGENCY DEPT Provider Note   CSN: [id number] Arrival date & time: 06/14/16  1449     History   Chief Complaint Chief Complaint  Patient presents with  . Hypertension    HPI Brenda [name] is a 71 y.o. female presenting with concerns of hypertension. She states that she's been feeling off for a couple weeks with hard to describe possible lightheadedness. She claims that it is not  like she is losing her balance or anything but she just doesn't feel quite right. She says she felt "yucky". She then went to the dentist for cleaning and they found her blood pressure to be elevated. She was subsequently seen by her PCP and monitored over a couple visits. Blood pressure did not stabilize and she was started back on lisinopril--HCTZ yesterday with first dose this morning at 7 AM. She explains that they had discontinued the HCTZ medication due to hypokalemia last September. She denies headache, dizziness, loss of balance, focal deficits, visual changes, fever, chills, chest pain, shortness of breath, dysuria, nausea, vomiting or any other symptoms.  HPI  Past Medical History:  Diagnosis Date  . Allergy    seasonal  . Chronic rhinitis 01/19/2007  . COLONIC POLYPS, HX OF 06/24/2006  . DIVERTICULOSIS, COLON 11/11/2006  . HYPERLIPIDEMIA 12/20/2008  . HYPERTENSION 06/24/2006  . Impaired glucose tolerance   . MENOPAUSAL SYNDROME 06/24/2006    Patient Active Problem List   Diagnosis Date Noted  . Diabetes mellitus without complication (HCC) 10/11/2014  . Dyslipidemia 12/20/2008  . CHRONIC RHINITIS 01/19/2007  . DIVERTICULOSIS, COLON 11/11/2006  . Essential hypertension 06/24/2006  . MENOPAUSAL SYNDROME 06/24/2006  . COLONIC POLYPS, HX OF 06/24/2006    Past Surgical History:  Procedure Laterality Date  . BREAST BIOPSY    . COLONOSCOPY    . POLYPECTOMY    . TUBAL LIGATION  1994   benign    OB History    No data available       Home Medications    Prior to Admission  medications   Medication Sig Start Date End Date Taking? Authorizing Provider  fexofenadine (ALLEGRA ALLERGY) 180 MG tablet Take 180 mg by mouth daily.   Yes [provider]  latanoprost (XALATAN) 0.005 % ophthalmic solution Place 1 drop into both eyes at bedtime. 05/02/16  Yes [provider]  lisinopril-hydrochlorothiazide (PRINZIDE,ZESTORETIC) 20-12.5 MG tablet Take 1 tablet by mouth daily. 06/13/16  Yes Kordsmeier, Amil AmenJulia, FNP  Pseudoephedrine-Ibuprofen (ADVIL COLD/SINUS PO) Take 1 tablet by mouth as needed.   Yes [provider]  lisinopril (PRINIVIL,ZESTRIL) 20 MG tablet Take 1 tablet (20 mg total) by mouth daily. Patient not taking: Reported on 06/14/2016 12/29/15   Gordy SaversKwiatkowski, Peter F, MD    Family History Family History  Problem Relation Age of Onset  . Mental illness Mother   . Cancer Father        lung  . Parkinsonism Brother   . Colon cancer Neg Hx     Social History Social History  Substance Use Topics  . Smoking status: Never Smoker  . Smokeless tobacco: Never Used  . Alcohol use No     Allergies   Patient has no known allergies.   Review of Systems Review of Systems  Constitutional: Negative for chills and fever.  HENT: Negative for ear pain, facial swelling, sore throat and tinnitus.   Eyes: Negative for photophobia, pain and visual disturbance.  Respiratory: Negative for cough, shortness of breath, wheezing and stridor.   Cardiovascular: Negative for  chest pain, palpitations and leg swelling.  Gastrointestinal: Negative for abdominal distention, abdominal pain, blood in stool, diarrhea, nausea and vomiting.  Genitourinary: Negative for difficulty urinating, dysuria, flank pain, frequency and hematuria.  Musculoskeletal: Negative for arthralgias, back pain, gait problem, neck pain and neck stiffness.  Skin: Negative for color change, pallor and rash.  Neurological: Positive for light-headedness. Negative for dizziness, tremors,  seizures, syncope, facial asymmetry, speech difficulty, weakness, numbness and headaches.       Intermittent. Not at this time.     Physical Exam Updated Vital Signs BP (!) 141/74   Pulse 78   Temp 98.5 F (36.9 C) (Oral)   Resp 19   Ht 5\' 1"  (1.549 m)   Wt 71.2 kg (157 lb)   SpO2 96%   BMI 29.66 kg/m   Physical Exam  Constitutional: She is oriented to person, place, and time. She appears well-developed and well-nourished. No distress.  Patient is afebrile, nontoxic appearing sitting comfortably in chair in no acute distress.  HENT:  Head: Normocephalic and atraumatic.  Mouth/Throat: Oropharynx is clear and moist. No oropharyngeal exudate.  Eyes: Conjunctivae and EOM are normal. Pupils are equal, round, and reactive to light. Right eye exhibits no discharge. Left eye exhibits no discharge. No scleral icterus.  Neck: Normal range of motion. Neck supple.  Cardiovascular: Normal rate, regular rhythm, normal heart sounds and intact distal pulses.   No murmur heard. Pulmonary/Chest: Effort normal and breath sounds normal. No respiratory distress. She has no wheezes. She has no rales. She exhibits no tenderness.  Abdominal: Soft. She exhibits no distension. There is no tenderness. There is no guarding.  Musculoskeletal: Normal range of motion. She exhibits no edema, tenderness or deformity.  Neurological: She is alert and oriented to person, place, and time. No cranial nerve deficit or sensory deficit. She exhibits normal muscle tone. Coordination normal.  Neurologic Exam:  - Mental status: Patient is alert and cooperative. Fluent speech and words are clear. Coherent thought processes and insight is good. Patient is oriented x 4 to person, place, time and event.  - Cranial nerves:  CN III, IV, VI: pupils equally round, reactive to light both direct and conscensual and normal accommodation. Full extra-ocular movement. CN V: motor temporalis and masseter strength intact. CN VII : muscles of  facial expression intact. CN X :  midline uvula. XI strength of sternocleidomastoid and trapezius muscles 5/5, XII: tongue is midline when protruded. - Motor: No involuntary movements. Muscle tone and bulk normal throughout. Muscle strength is 5/5 in bilateral shoulder abduction, elbow flexion and extension, grip, hip extension, flexion, leg flexion and extension, ankle dorsiflexion and plantar flexion.  - Sensory: Proprioception, light tough sensation intact in all extremities.  - Cerebellar: rapid alternating movements and point to point movement intact in upper and lower extremities. Normal stance and gait.  Skin: Skin is warm and dry. No rash noted. She is not diaphoretic. No erythema. No pallor.  Psychiatric: She has a normal mood and affect.  Nursing note and vitals reviewed.    ED Treatments / Results  Labs (all labs ordered are listed, but only abnormal results are displayed) Labs Reviewed  CBC - Abnormal; Notable for the following:       Result Value   Hemoglobin 15.1 (*)    All other components within normal limits  URINALYSIS, ROUTINE W REFLEX MICROSCOPIC - Abnormal; Notable for the following:    Color, Urine STRAW (*)    Specific Gravity, Urine 1.004 (*)  Hgb urine dipstick SMALL (*)    Squamous Epithelial / LPF 0-5 (*)    All other components within normal limits  I-STAT TROPOININ, ED    EKG  EKG Interpretation  Date/Time:  Friday Jun 14 2016 16:01:54 EDT Ventricular Rate:  104 PR Interval:    QRS Duration: 81 QT Interval:  342 QTC Calculation: 450 R Axis:   -17 Text Interpretation:  Sinus tachycardia Borderline left axis deviation No old tracing to compare Confirmed by Mancel Bale [phone]) on 06/14/2016 4:08:58 PM       Radiology Dg Chest 2 View  Result Date: 06/14/2016 CLINICAL DATA:  Hypertensive, lightheaded EXAM: CHEST  2 VIEW COMPARISON:  None. FINDINGS: The heart size and mediastinal contours are within normal limits. Both lungs are clear. The  visualized skeletal structures are unremarkable. IMPRESSION: No active cardiopulmonary disease. Electronically Signed   By: Elige Ko   On: 06/14/2016 16:06    Procedures Procedures (including critical care time)  Medications Ordered in ED Medications - No data to display   Initial Impression / Assessment and Plan / ED Course  I have reviewed the triage vital signs and the nursing notes.  Pertinent labs & imaging results that were available during my care of the patient were reviewed by me and considered in my medical decision making (see chart for details).     Patient presents with hypertension. She was just started back on lisinopril-hydrochlorothiazide 20-12.5 yesterday with first dose at 7 AM this morning. She explains that she's been monitoring her blood pressure after a high reading at the dentist a week ago and that has not been coming down. She has been having a vague sensation and of not feeling well. She states it maybe something like lightheadedness now and again. Denies any other symptoms. Reassuring exam, normal neuro exam, lungs are clear to auscultation bilaterally. She is afebrile nontoxic appearing.  Ordered labs, EKG, chest x-ray to evaluate for any end organ damage.  On reassessment, blood pressure had stabilized and patient was feeling well.  Labs, EKG and CXR unremarkable. No protein in her urine. BMP from earlier today with normal creatinine.  No evidence of end organ Damage, patient was instructed on how to take her blood pressure properly. She was ready to go home. We'll discharge home with close PCP follow-up  No signs of hypertensive urgency.  Discussed with patient the need for close follow-up and management by their primary care physician.  Patient was discussed with Dr. Effie Shy who has seen patient and agrees with assessment and plan. Discussed strict return precautions and advised to return to the emergency department if experiencing any new or worsening  symptoms. Instructions were understood and patient agreed with discharge plan. Final Clinical Impressions(s) / ED Diagnoses   Final diagnoses:  Essential hypertension    New Prescriptions New Prescriptions   No medications on file     Gregary Cromer 06/14/16 1656    Georgiana Shore, PA-C 06/14/16 1707    Mancel Bale, MD 06/15/16 [phone]

## 2016-06-14 NOTE — Telephone Encounter (Signed)
Patient was seen by Amil AmenJulia and had some blood work to check for her potassium levels.

## 2016-06-14 NOTE — ED Triage Notes (Signed)
Patient states that she went to Dentist last week to have her teeth cleaned and was told it was too high. Patient has been monitoring it over the past week and having issues with it running high.  Patient denies headaches or blurred vision.

## 2016-06-14 NOTE — ED Provider Notes (Signed)
  Face-to-face evaluation   History: She presents for evaluation of high blood pressure.  She has been checking her pressures at home and found them high.  Yesterday her PCP added hydrochlorothiazide to her current treatment.  Her potassium was checked yesterday, and was normal.  Physical exam: Alert, cooperative.  No dysarthria or aphasia.  She is lucid.  No respiratory distress.   Medical screening examination/treatment/procedure(s) were conducted as a shared visit with non-physician practitioner(s) and myself.  I personally evaluated the patient during the encounter    Mancel BaleWentz, Joselyn Edling, MD 06/15/16 [phone]

## 2016-06-14 NOTE — ED Notes (Signed)
Patient transported to X-ray 

## 2016-06-18 ENCOUNTER — Encounter: Payer: Self-pay | Admitting: Internal Medicine

## 2016-06-18 ENCOUNTER — Ambulatory Visit (INDEPENDENT_AMBULATORY_CARE_PROVIDER_SITE_OTHER): Payer: PPO | Admitting: Internal Medicine

## 2016-06-18 VITALS — BP 142/80 | HR 78 | Temp 98.0°F | Ht 61.0 in | Wt 155.6 lb

## 2016-06-18 DIAGNOSIS — I10 Essential (primary) hypertension: Secondary | ICD-10-CM

## 2016-06-18 NOTE — Patient Instructions (Addendum)
WE NOW OFFER   [facility] FAST TRACK!!!  SAME DAY Appointments for ACUTE CARE  Such as: Sprains, Injuries, cuts, abrasions, rashes, muscle pain, joint pain, back pain Colds, flu, sore throats, headache, allergies, cough, fever  Ear pain, sinus and eye infections Abdominal pain, nausea, vomiting, diarrhea, upset stomach Animal/insect bites  3 Easy Ways to Schedule: Walk-In Scheduling Call in scheduling Mychart Sign-up: [email or web address]  Limit your sodium (Salt) intake  Please check your blood pressure on a regular basis.  If it is consistently greater than 150/90, please make an office appointment.    It is important that you exercise regularly, at least 20 minutes 3 to 4 times per week.  If you develop chest pain or shortness of breath seek  medical attention.       

## 2016-06-18 NOTE — Progress Notes (Signed)
Subjective:    Patient ID: Brenda [name], female    DOB: 12-08-1945, 71 y.o.   MRN: [id number]  HPI  71 year old patient who is seen today for follow-up of hypertension. She was seen at her dentist recently.  She now is on combination therapy with hydrochlorothiazide.  She is tolerating this medication well but does have a history of hypokalemia.  She is scheduled for follow-up next month. Past Medical History:  Diagnosis Date  . Allergy    seasonal  . Chronic rhinitis 01/19/2007  . COLONIC POLYPS, HX OF 06/24/2006  . DIVERTICULOSIS, COLON 11/11/2006  . HYPERLIPIDEMIA 12/20/2008  . HYPERTENSION 06/24/2006  . Impaired glucose tolerance   . MENOPAUSAL SYNDROME 06/24/2006     Social History   Social History  . Marital status: Married    Spouse name: N/A  . Number of children: N/A  . Years of education: N/A   Occupational History  . Not on file.   Social History Main Topics  . Smoking status: Never Smoker  . Smokeless tobacco: Never Used  . Alcohol use No  . Drug use: No  . Sexual activity: Not on file   Other Topics Concern  . Not on file   Social History Narrative  . No narrative on file    Past Surgical History:  Procedure Laterality Date  . BREAST BIOPSY    . COLONOSCOPY    . POLYPECTOMY    . TUBAL LIGATION  1994   benign    Family History  Problem Relation Age of Onset  . Mental illness Mother   . Cancer Father        lung  . Parkinsonism Brother   . Colon cancer Neg Hx     No Known Allergies  Current Outpatient Prescriptions on File Prior to Visit  Medication Sig Dispense Refill  . fexofenadine (ALLEGRA ALLERGY) 180 MG tablet Take 180 mg by mouth daily.    Marland Kitchen. latanoprost (XALATAN) 0.005 % ophthalmic solution Place 1 drop into both eyes at bedtime.    Marland Kitchen. lisinopril-hydrochlorothiazide (PRINZIDE,ZESTORETIC) 20-12.5 MG tablet Take 1 tablet by mouth daily. 90 tablet 3  . Pseudoephedrine-Ibuprofen (ADVIL COLD/SINUS PO) Take 1 tablet by mouth as  needed.     No current facility-administered medications on file prior to visit.     BP (!) 142/80 (BP Location: Left Arm, Patient Position: Sitting, Cuff Size: Normal)   Pulse 78   Temp 98 F (36.7 C) (Oral)   Ht 5\' 1"  (1.549 m)   Wt 155 lb 9.6 oz (70.6 kg)   SpO2 98%   BMI 29.40 kg/m     Review of Systems  Constitutional: Negative.   HENT: Negative for congestion, dental problem, hearing loss, rhinorrhea, sinus pressure, sore throat and tinnitus.   Eyes: Negative for pain, discharge and visual disturbance.  Respiratory: Negative for cough and shortness of breath.   Cardiovascular: Negative for chest pain, palpitations and leg swelling.  Gastrointestinal: Negative for abdominal distention, abdominal pain, blood in stool, constipation, diarrhea, nausea and vomiting.  Genitourinary: Negative for difficulty urinating, dysuria, flank pain, frequency, hematuria, pelvic pain, urgency, vaginal bleeding, vaginal discharge and vaginal pain.  Musculoskeletal: Negative for arthralgias, gait problem and joint swelling.  Skin: Negative for rash.  Neurological: Negative for dizziness, syncope, speech difficulty, weakness, numbness and headaches.  Hematological: Negative for adenopathy.  Psychiatric/Behavioral: Negative for agitation, behavioral problems and dysphoric mood. The patient is not nervous/anxious.        Objective:   Physical Exam  Constitutional: She appears well-developed and well-nourished. No distress.  Blood pressure 140/76  Cardiovascular: Regular rhythm.   Pulmonary/Chest: Effort normal and breath sounds normal. She has no rales.  Musculoskeletal: She exhibits no edema.          Assessment & Plan:   Hypertension.  Will continue combination therapy.  Follow-up next month as scheduled  with electrolytes  [name]

## 2016-06-19 NOTE — Telephone Encounter (Signed)
Noted  

## 2016-07-16 ENCOUNTER — Encounter: Payer: Self-pay | Admitting: Internal Medicine

## 2016-07-16 ENCOUNTER — Ambulatory Visit (INDEPENDENT_AMBULATORY_CARE_PROVIDER_SITE_OTHER): Payer: PPO | Admitting: Internal Medicine

## 2016-07-16 VITALS — BP 158/80 | HR 78 | Temp 98.0°F | Ht 61.0 in | Wt 156.4 lb

## 2016-07-16 DIAGNOSIS — E119 Type 2 diabetes mellitus without complications: Secondary | ICD-10-CM | POA: Diagnosis not present

## 2016-07-16 DIAGNOSIS — I10 Essential (primary) hypertension: Secondary | ICD-10-CM

## 2016-07-16 LAB — BASIC METABOLIC PANEL
BUN: 15 mg/dL (ref 6–23)
CO2: 32 meq/L (ref 19–32)
CREATININE: 0.79 mg/dL (ref 0.40–1.20)
Calcium: 10 mg/dL (ref 8.4–10.5)
Chloride: 101 mEq/L (ref 96–112)
GFR: 92.25 mL/min (ref >60.00)
Glucose, Bld: 142 mg/dL — ABNORMAL HIGH (ref 70–99)
Potassium: 3.9 mEq/L (ref 3.5–5.1)
SODIUM: 140 meq/L (ref 135–145)

## 2016-07-16 LAB — HEMOGLOBIN A1C: HEMOGLOBIN A1C: 6.5 % (ref 4.6–6.5)

## 2016-07-16 MED ORDER — AMLODIPINE BESYLATE 2.5 MG PO TABS: 2.5000 mg | ORAL_TABLET | Freq: Every day | ORAL | 3 refills | Status: DC

## 2016-07-16 NOTE — Patient Instructions (Addendum)
WE NOW OFFER   [facility] FAST TRACK!!!  SAME DAY Appointments for ACUTE CARE  Such as: Sprains, Injuries, cuts, abrasions, rashes, muscle pain, joint pain, back pain Colds, flu, sore throats, headache, allergies, cough, fever  Ear pain, sinus and eye infections Abdominal pain, nausea, vomiting, diarrhea, upset stomach Animal/insect bites  3 Easy Ways to Schedule: Walk-In Scheduling Call in scheduling Mychart Sign-up: [email or web address]  Limit your sodium (Salt) intake  Please check your blood pressure on a regular basis.  Return in one month for follow-up

## 2016-07-16 NOTE — Progress Notes (Signed)
Subjective:    Patient ID: Brenda [name], female    DOB: [birth year], 71 y.o.   MRN: [id number]  HPI 71 year old patient who is seen today for follow-up of hypertension.  She is now on combination therapy which includes a diuretic.  She does have a history of hypokalemia. She has a history of diet-controlled diabetes. She has been monitoring home blood pressure readings were consistently high readings, especially systolics She generally feels well except for some stress-related issues  Past Medical History:  Diagnosis Date  . Allergy    seasonal  . Chronic rhinitis 01/19/2007  . COLONIC POLYPS, HX OF 06/24/2006  . DIVERTICULOSIS, COLON 11/11/2006  . HYPERLIPIDEMIA 12/20/2008  . HYPERTENSION 06/24/2006  . Impaired glucose tolerance   . MENOPAUSAL SYNDROME 06/24/2006     Social History   Social History  . Marital status: Married    Spouse name: N/A  . Number of children: N/A  . Years of education: N/A   Occupational History  . Not on file.   Social History Main Topics  . Smoking status: Never Smoker  . Smokeless tobacco: Never Used  . Alcohol use No  . Drug use: No  . Sexual activity: Not on file   Other Topics Concern  . Not on file   Social History Narrative  . No narrative on file    Past Surgical History:  Procedure Laterality Date  . BREAST BIOPSY    . COLONOSCOPY    . POLYPECTOMY    . TUBAL LIGATION  1994   benign    Family History  Problem Relation Age of Onset  . Mental illness Mother   . Cancer Father        lung  . Parkinsonism Brother   . Colon cancer Neg Hx     No Known Allergies  Current Outpatient Prescriptions on File Prior to Visit  Medication Sig Dispense Refill  . fexofenadine (ALLEGRA ALLERGY) 180 MG tablet Take 180 mg by mouth daily.    Marland Kitchen. latanoprost (XALATAN) 0.005 % ophthalmic solution Place 1 drop into both eyes at bedtime.    Marland Kitchen. lisinopril-hydrochlorothiazide (PRINZIDE,ZESTORETIC) 20-12.5 MG tablet Take 1 tablet by mouth daily.  90 tablet 3   No current facility-administered medications on file prior to visit.     BP (!) 158/80 (BP Location: Left Arm, Patient Position: Sitting, Cuff Size: Normal)   Pulse 78   Temp 98 F (36.7 C) (Oral)   Ht 5\' 1"  (1.549 m)   Wt 156 lb 6.4 oz (70.9 kg)   SpO2 98%   BMI 29.55 kg/m      Review of Systems  Constitutional: Negative.   HENT: Negative for congestion, dental problem, hearing loss, rhinorrhea, sinus pressure, sore throat and tinnitus.   Eyes: Negative for pain, discharge and visual disturbance.  Respiratory: Negative for cough and shortness of breath.   Cardiovascular: Negative for chest pain, palpitations and leg swelling.  Gastrointestinal: Negative for abdominal distention, abdominal pain, blood in stool, constipation, diarrhea, nausea and vomiting.  Genitourinary: Negative for difficulty urinating, dysuria, flank pain, frequency, hematuria, pelvic pain, urgency, vaginal bleeding, vaginal discharge and vaginal pain.  Musculoskeletal: Negative for arthralgias, gait problem and joint swelling.  Skin: Negative for rash.  Neurological: Negative for dizziness, syncope, speech difficulty, weakness, numbness and headaches.  Hematological: Negative for adenopathy.  Psychiatric/Behavioral: Negative for agitation, behavioral problems and dysphoric mood. The patient is nervous/anxious.        Objective:   Physical Exam  Constitutional: She appears  well-developed and well-nourished. No distress.  Blood pressure both arms in the range of 180 over 80 Home blood pressure monitor with similar readings          Assessment & Plan:   Hypertension.  Suboptimal control.  Will add amlodipine 2.5 milligrams daily Continue home blood pressure monitoring Continue restricted salt diet Increase exercise regimen encouraged  Diet-controlled diabetes.  Will check hemoglobin A1c  Follow-up one month  [name]

## 2016-08-14 ENCOUNTER — Ambulatory Visit (INDEPENDENT_AMBULATORY_CARE_PROVIDER_SITE_OTHER): Payer: PPO | Admitting: Internal Medicine

## 2016-08-14 ENCOUNTER — Encounter: Payer: Self-pay | Admitting: Internal Medicine

## 2016-08-14 VITALS — BP 128/78 | HR 62 | Temp 98.2°F | Wt 157.0 lb

## 2016-08-14 DIAGNOSIS — E119 Type 2 diabetes mellitus without complications: Secondary | ICD-10-CM

## 2016-08-14 DIAGNOSIS — I10 Essential (primary) hypertension: Secondary | ICD-10-CM

## 2016-08-14 NOTE — Progress Notes (Signed)
   Subjective:    Patient ID: Brenda [name], female    DOB: December 15, 1945, 71 y.o.   MRN: [id number]  HPI  BP Readings from Last 3 Encounters:  08/14/16 128/78  07/16/16 (!) 158/80  06/18/16 (!) 51142/2180   71 year old patient who is seen today for follow-up of essential hypertension.  She has type 2 diabetes. Blood pressure has been suboptimally controlled and amlodipine 2.5 milligrams added to her regimen.  1 month ago She feels much improved and has enjoyed normal.  Home blood pressures  Past Medical History:  Diagnosis Date  . Allergy    seasonal  . Chronic rhinitis 01/19/2007  . COLONIC POLYPS, HX OF 06/24/2006  . DIVERTICULOSIS, COLON 11/11/2006  . HYPERLIPIDEMIA 12/20/2008  . HYPERTENSION 06/24/2006  . Impaired glucose tolerance   . MENOPAUSAL SYNDROME 06/24/2006     Social History   Social History  . Marital status: Married    Spouse name: N/A  . Number of children: N/A  . Years of education: N/A   Occupational History  . Not on file.   Social History Main Topics  . Smoking status: Never Smoker  . Smokeless tobacco: Never Used  . Alcohol use No  . Drug use: No  . Sexual activity: Not on file   Other Topics Concern  . Not on file   Social History Narrative  . No narrative on file    Past Surgical History:  Procedure Laterality Date  . BREAST BIOPSY    . COLONOSCOPY    . POLYPECTOMY    . TUBAL LIGATION  1994   benign    Family History  Problem Relation Age of Onset  . Mental illness Mother   . Cancer Father        lung  . Parkinsonism Brother   . Colon cancer Neg Hx     No Known Allergies  Current Outpatient Prescriptions on File Prior to Visit  Medication Sig Dispense Refill  . amLODipine (NORVASC) 2.5 MG tablet Take 1 tablet (2.5 mg total) by mouth daily. 90 tablet 3  . fexofenadine (ALLEGRA ALLERGY) 180 MG tablet Take 180 mg by mouth daily.    Marland Kitchen. latanoprost (XALATAN) 0.005 % ophthalmic solution Place 1 drop into both eyes at bedtime.    Marland Kitchen.  lisinopril-hydrochlorothiazide (PRINZIDE,ZESTORETIC) 20-12.5 MG tablet Take 1 tablet by mouth daily. 90 tablet 3   No current facility-administered medications on file prior to visit.     BP 128/78 (BP Location: Left Arm, Patient Position: Sitting, Cuff Size: Normal)   Pulse 62   Temp 98.2 F (36.8 C) (Oral)   Wt 157 lb (71.2 kg)   SpO2 98%   BMI 29.66 kg/m    Review of Systems  Constitutional: Negative.   Respiratory: Negative for cough and shortness of breath.   Cardiovascular: Negative for leg swelling.       Objective:   Physical Exam  Constitutional: She appears well-developed and well-nourished. No distress.  Blood pressure 122/70  Cardiovascular: Normal rate and regular rhythm.   No murmur heard. Pulmonary/Chest: Effort normal and breath sounds normal. She has no rales.  Musculoskeletal: She exhibits edema.          Assessment & Plan:   Essential hypertension, well-controlled.  No change in therapy Continue home blood pressure monitoring.  Restricted salt diet  Follow-up 6 months or as needed  NIKEKWIATKOWSKI,Zamariyah Furukawa FRANK

## 2016-08-14 NOTE — Patient Instructions (Signed)
Limit your sodium (Salt) intake  Please check your blood pressure on a regular basis.  If it is consistently greater than 150/90, please make an office appointment.    It is important that you exercise regularly, at least 20 minutes 3 to 4 times per week.  If you develop chest pain or shortness of breath seek  medical attention.  Return in 6 months for follow-up  

## 2016-10-10 ENCOUNTER — Encounter: Payer: Self-pay | Admitting: Internal Medicine

## 2016-11-08 DIAGNOSIS — H401132 Primary open-angle glaucoma, bilateral, moderate stage: Secondary | ICD-10-CM | POA: Diagnosis not present

## 2016-12-16 DIAGNOSIS — H401132 Primary open-angle glaucoma, bilateral, moderate stage: Secondary | ICD-10-CM | POA: Diagnosis not present

## 2016-12-20 ENCOUNTER — Encounter: Payer: Self-pay | Admitting: Internal Medicine

## 2017-02-17 ENCOUNTER — Ambulatory Visit: Payer: PPO | Admitting: Internal Medicine

## 2017-03-31 ENCOUNTER — Encounter: Payer: Self-pay | Admitting: Internal Medicine

## 2017-03-31 ENCOUNTER — Ambulatory Visit (INDEPENDENT_AMBULATORY_CARE_PROVIDER_SITE_OTHER): Payer: PPO | Admitting: Internal Medicine

## 2017-03-31 VITALS — BP 138/80 | HR 65 | Wt 162.0 lb

## 2017-03-31 DIAGNOSIS — I10 Essential (primary) hypertension: Secondary | ICD-10-CM

## 2017-03-31 DIAGNOSIS — E119 Type 2 diabetes mellitus without complications: Secondary | ICD-10-CM | POA: Diagnosis not present

## 2017-03-31 DIAGNOSIS — E785 Hyperlipidemia, unspecified: Secondary | ICD-10-CM | POA: Diagnosis not present

## 2017-03-31 LAB — CBC WITH DIFFERENTIAL/PLATELET
BASOS ABS: 0 10*3/uL (ref 0.0–0.1)
Basophils Relative: 0.6 % (ref 0.0–3.0)
EOS PCT: 4.5 % (ref 0.0–5.0)
Eosinophils Absolute: 0.2 10*3/uL (ref 0.0–0.7)
HCT: 42.2 % (ref 36.0–46.0)
Hemoglobin: 14.6 g/dL (ref 12.0–15.0)
LYMPHS ABS: 2.1 10*3/uL (ref 0.7–4.0)
LYMPHS PCT: 41.1 % (ref 12.0–46.0)
MCHC: 34.6 g/dL (ref 30.0–36.0)
MCV: 90.3 fl (ref 78.0–100.0)
MONOS PCT: 8.8 % (ref 3.0–12.0)
Monocytes Absolute: 0.5 10*3/uL (ref 0.1–1.0)
NEUTROS PCT: 45 % (ref 43.0–77.0)
Neutro Abs: 2.3 10*3/uL (ref 1.4–7.7)
Platelets: 247 10*3/uL (ref 150.0–400.0)
RBC: 4.68 Mil/uL (ref 3.87–5.11)
RDW: 13.3 % (ref 11.5–15.5)
WBC: 5.2 10*3/uL (ref 4.0–10.5)

## 2017-03-31 LAB — MICROALBUMIN / CREATININE URINE RATIO
Creatinine,U: 38.4 mg/dL
MICROALB/CREAT RATIO: 1.8 mg/g (ref 0.0–30.0)
Microalb, Ur: <0.7 mg/dL (ref 0.0–1.9)

## 2017-03-31 LAB — COMPREHENSIVE METABOLIC PANEL
ALBUMIN: 4.2 g/dL (ref 3.5–5.2)
ALK PHOS: 54 U/L (ref 39–117)
ALT: 11 U/L (ref 0–35)
AST: 13 U/L (ref 0–37)
BUN: 12 mg/dL (ref 6–23)
CHLORIDE: 101 meq/L (ref 96–112)
CO2: 33 mEq/L — ABNORMAL HIGH (ref 19–32)
Calcium: 9.9 mg/dL (ref 8.4–10.5)
Creatinine, Ser: 0.74 mg/dL (ref 0.40–1.20)
GFR: 99.28 mL/min (ref >60.00)
Glucose, Bld: 145 mg/dL — ABNORMAL HIGH (ref 70–99)
POTASSIUM: 3.6 meq/L (ref 3.5–5.1)
SODIUM: 143 meq/L (ref 135–145)
TOTAL PROTEIN: 6.7 g/dL (ref 6.0–8.3)
Total Bilirubin: 0.6 mg/dL (ref 0.2–1.2)

## 2017-03-31 LAB — HEMOGLOBIN A1C: HEMOGLOBIN A1C: 6.5 % (ref 4.6–6.5)

## 2017-03-31 LAB — LIPID PANEL
CHOLESTEROL: 218 mg/dL — AB (ref 0–200)
HDL: 47 mg/dL (ref >39.00)
LDL CALC: 147 mg/dL — AB (ref 0–99)
NonHDL: 170.56
Total CHOL/HDL Ratio: 5
Triglycerides: 116 mg/dL (ref 0.0–149.0)
VLDL: 23.2 mg/dL (ref 0.0–40.0)

## 2017-03-31 LAB — TSH: TSH: 1.98 u[IU]/mL (ref 0.35–4.50)

## 2017-03-31 NOTE — Patient Instructions (Signed)

## 2017-03-31 NOTE — Progress Notes (Signed)
Subjective:    Patient ID: Brenda [name], female    DOB: 10/12/1945, 72 y.o.   MRN: [id number]  HPI  Lab Results  Component Value Date   HGBA1C 6.5 07/16/2016   72 year old patient who is seen today for follow-up.  She has a history of essential hypertension and diet-controlled diabetes.  She is seen by ophthalmology twice annually.  Doing quite well without concerns or complaints.  She does walk on a treadmill 4 times per week and denies any cardiopulmonary complaints  Wt Readings from Last 3 Encounters:  03/31/17 162 lb (73.5 kg)  08/14/16 157 lb (71.2 kg)  07/16/16 156 lb 6.4 oz (70.9 kg)    Past Medical History:  Diagnosis Date  . Allergy    seasonal  . Chronic rhinitis 01/19/2007  . COLONIC POLYPS, HX OF 06/24/2006  . DIVERTICULOSIS, COLON 11/11/2006  . HYPERLIPIDEMIA 12/20/2008  . HYPERTENSION 06/24/2006  . Impaired glucose tolerance   . MENOPAUSAL SYNDROME 06/24/2006     Social History   Socioeconomic History  . Marital status: Married    Spouse name: Not on file  . Number of children: Not on file  . Years of education: Not on file  . Highest education level: Not on file  Social Needs  . Financial resource strain: Not on file  . Food insecurity - worry: Not on file  . Food insecurity - inability: Not on file  . Transportation needs - medical: Not on file  . Transportation needs - non-medical: Not on file  Occupational History  . Not on file  Tobacco Use  . Smoking status: Never Smoker  . Smokeless tobacco: Never Used  Substance and Sexual Activity  . Alcohol use: No  . Drug use: No  . Sexual activity: Not on file  Other Topics Concern  . Not on file  Social History Narrative  . Not on file    Past Surgical History:  Procedure Laterality Date  . BREAST BIOPSY    . COLONOSCOPY    . POLYPECTOMY    . TUBAL LIGATION  1994   benign    Family History  Problem Relation Age of Onset  . Mental illness Mother   . Cancer Father        lung  .  Parkinsonism Brother   . Colon cancer Neg Hx     No Known Allergies  Current Outpatient Medications on File Prior to Visit  Medication Sig Dispense Refill  . amLODipine (NORVASC) 2.5 MG tablet Take 1 tablet (2.5 mg total) by mouth daily. 90 tablet 3  . dorzolamide-timolol (COSOPT) 22.3-6.8 MG/ML ophthalmic solution     . fexofenadine (ALLEGRA ALLERGY) 180 MG tablet Take 180 mg by mouth daily.    Marland Kitchen latanoprost (XALATAN) 0.005 % ophthalmic solution Place 1 drop into both eyes at bedtime.    Marland Kitchen lisinopril-hydrochlorothiazide (PRINZIDE,ZESTORETIC) 20-12.5 MG tablet Take 1 tablet by mouth daily. 90 tablet 3   No current facility-administered medications on file prior to visit.     BP 138/80 (BP Location: Right Arm, Patient Position: Sitting, Cuff Size: Large)   Pulse 65   Wt 162 lb (73.5 kg)   SpO2 100%   BMI 30.61 kg/m    Review of Systems  Constitutional: Negative.   HENT: Negative for congestion, dental problem, hearing loss, rhinorrhea, sinus pressure, sore throat and tinnitus.   Eyes: Negative for pain, discharge and visual disturbance.  Respiratory: Negative for cough and shortness of breath.   Cardiovascular: Negative for chest  pain, palpitations and leg swelling.  Gastrointestinal: Negative for abdominal distention, abdominal pain, blood in stool, constipation, diarrhea, nausea and vomiting.  Genitourinary: Negative for difficulty urinating, dysuria, flank pain, frequency, hematuria, pelvic pain, urgency, vaginal bleeding, vaginal discharge and vaginal pain.  Musculoskeletal: Positive for arthralgias. Negative for gait problem and joint swelling.  Skin: Negative for rash.  Neurological: Negative for dizziness, syncope, speech difficulty, weakness, numbness and headaches.  Hematological: Negative for adenopathy.  Psychiatric/Behavioral: Negative for agitation, behavioral problems and dysphoric mood. The patient is not nervous/anxious.        Objective:   Physical Exam    Constitutional: She is oriented to person, place, and time. She appears well-developed and well-nourished.  HENT:  Head: Normocephalic.  Right Ear: External ear normal.  Left Ear: External ear normal.  Mouth/Throat: Oropharynx is clear and moist.  Pharyngeal crowding  Eyes: Conjunctivae and EOM are normal. Pupils are equal, round, and reactive to light.  Neck: Normal range of motion. Neck supple. No thyromegaly present.  Cardiovascular: Normal rate, regular rhythm, normal heart sounds and intact distal pulses.  Pulmonary/Chest: Effort normal and breath sounds normal.  Abdominal: Soft. Bowel sounds are normal. She exhibits no mass. There is no tenderness.  Musculoskeletal: Normal range of motion.  Lymphadenopathy:    She has no cervical adenopathy.  Neurological: She is alert and oriented to person, place, and time.  Skin: Skin is warm and dry. No rash noted.  Psychiatric: She has a normal mood and affect. Her behavior is normal.          Assessment & Plan:   Essential hypertension.  Well controlled.  No change in therapy Diet-controlled diabetes.  Will review a hemoglobin A1c History of colonic polyps.  5-year colonoscopy due later this year.  We will set up at the time of her physical in 6 months  Diabetic foot examination performed  Rogelia BogaKWIATKOWSKI,PETER FRANK

## 2017-04-16 DIAGNOSIS — Z1231 Encounter for screening mammogram for malignant neoplasm of breast: Secondary | ICD-10-CM | POA: Diagnosis not present

## 2017-05-12 DIAGNOSIS — H401132 Primary open-angle glaucoma, bilateral, moderate stage: Secondary | ICD-10-CM | POA: Diagnosis not present

## 2017-05-24 ENCOUNTER — Other Ambulatory Visit: Payer: Self-pay | Admitting: Family Medicine

## 2017-05-24 DIAGNOSIS — I10 Essential (primary) hypertension: Secondary | ICD-10-CM

## 2017-05-27 ENCOUNTER — Other Ambulatory Visit: Payer: Self-pay | Admitting: Internal Medicine

## 2017-05-27 ENCOUNTER — Telehealth: Payer: Self-pay | Admitting: Internal Medicine

## 2017-05-27 NOTE — Telephone Encounter (Signed)
Copied from CRM [id number]. Topic: Quick Communication - Rx Refill/Question >> May 27, 2017  1:00 PM Gerrianne Scale wrote: Medication: lisinopril-hydrochlorothiazide (PRINZIDE,ZESTORETIC) 20-12.5 MG tablet   amLODipine (NORVASC) 2.5 MG tablet Has the patient contacted their pharmacy? Yes.     (Agent: If no, request that the patient contact the pharmacy for the refill.) Preferred Pharmacy (with phone number or street name): [facility] [address] [place], [place] - [address] [phone] (Phone) [phone] (Fax) Agent: Please be advised that RX refills may take up to 3 business days. We ask that you follow-up with your pharmacy.

## 2017-05-28 ENCOUNTER — Telehealth: Payer: Self-pay | Admitting: Family Medicine

## 2017-05-28 ENCOUNTER — Ambulatory Visit (INDEPENDENT_AMBULATORY_CARE_PROVIDER_SITE_OTHER): Payer: PPO | Admitting: Physician Assistant

## 2017-05-28 ENCOUNTER — Other Ambulatory Visit: Payer: Self-pay | Admitting: *Deleted

## 2017-05-28 ENCOUNTER — Encounter: Payer: Self-pay | Admitting: Physician Assistant

## 2017-05-28 ENCOUNTER — Other Ambulatory Visit: Payer: Self-pay

## 2017-05-28 VITALS — BP 191/81 | HR 80 | Temp 97.9°F | Resp 17 | Ht 61.0 in | Wt 160.0 lb

## 2017-05-28 DIAGNOSIS — R030 Elevated blood-pressure reading, without diagnosis of hypertension: Secondary | ICD-10-CM | POA: Diagnosis not present

## 2017-05-28 DIAGNOSIS — R6884 Jaw pain: Secondary | ICD-10-CM | POA: Diagnosis not present

## 2017-05-28 DIAGNOSIS — I10 Essential (primary) hypertension: Secondary | ICD-10-CM

## 2017-05-28 MED ORDER — LISINOPRIL-HYDROCHLOROTHIAZIDE 20-12.5 MG PO TABS: 1.0000 | ORAL_TABLET | Freq: Every day | ORAL | 3 refills | Status: DC

## 2017-05-28 MED ORDER — TIZANIDINE HCL 2 MG PO CAPS: 2.0000 mg | ORAL_CAPSULE | Freq: Three times a day (TID) | ORAL | 0 refills | Status: DC

## 2017-05-28 NOTE — Patient Instructions (Addendum)
Start taking tizanidine 63m every 6-8 hours as needed for the pain on the side of your face. If needed, you can take 434mevery 6-8 hours.  This medication may cause dizziness.   Work on stress relieving activities. (see below)  Come back and see me in 1 week if you are not improving.   GUIDE TO HAPPY AND HEALTHY LIVING These are some of my general health and wellness recommendations. Some of them may apply to you better than others. Please use common sense as you try these suggestions and feel free to ask me any questions.   ACTIVITY/FITNESS Mental, social, emotional and physical stimulation are very important for brain and body health. Try learning a new activity (arts, music, language, sports, games).  Keep moving your body to the best of your abilities. You can do this at home, inside or outside, the park, community center, gym or anywhere you like. Consider a physical therapist or personal trainer to get started. Consider the app Sworkit. Fitness trackers such as smart-watches, smart-phones or Fitbits can help as well.   NUTRITION Eat more plants: colorful vegetables, nuts, seeds and berries.  Eat less sugar, salt, preservatives and processed foods.  Avoid toxins such as cigarettes and alcohol.  Drink water when you are thirsty. Warm water with a slice of lemon is an excellent morning drink to start the day.  Consider these websites for more information The Nutrition Source (hthttps://www.henry-hernandez.biz/Precision Nutrition (wwWindowBlog.ch  RELAXATION Consider practicing mindfulness meditation or other relaxation techniques such as deep breathing, prayer, yoga, tai chi, massage. See website mindful.org or the apps Headspace or Calm to help get started.   SLEEP Try to get at least 7-8+ hours sleep per day. Regular exercise and reduced caffeine will help you sleep better. Practice good sleep hygeine techniques. See  website sleep.org for more information.   PLANNING Prepare estate planning, living will, healthcare POA documents. Sometimes this is best planned with the help of an attorney. Theconversationproject.org and agingwithdignity.org are excellent resources.   Tizanidine tablets or capsules What is this medicine? TIZANIDINE (tye ZAN i deen) helps to relieve muscle spasms. It may be used to help in the treatment of multiple sclerosis and spinal cord injury. This medicine may be used for other purposes; ask your health care provider or pharmacist if you have questions. COMMON BRAND NAME(S): Zanaflex What should I tell my health care provider before I take this medicine? They need to know if you have any of these conditions: -kidney disease -liver disease -low blood pressure -mental disorder -an unusual or allergic reaction to tizanidine, other medicines, lactose (tablets only), foods, dyes, or preservatives -pregnant or trying to get pregnant -breast-feeding How should I use this medicine? Take this medicine by mouth with a full glass of water. Take this medicine on an empty stomach, at least 30 minutes before or 2 hours after food. Do not take with food unless you talk with your doctor. Follow the directions on the prescription label. Take your medicine at regular intervals. Do not take your medicine more often than directed. Do not stop taking except on your doctor's advice. Suddenly stopping the medicine can be very dangerous. Talk to your pediatrician regarding the use of this medicine in children. Patients over 657ears old may have a stronger reaction and need a smaller dose. Overdosage: If you think you have taken too much of this medicine contact a poison control center or emergency room at once. NOTE: This medicine is only for you.  Do not share this medicine with others. What if I miss a dose? If you miss a dose, take it as soon as you can. If it is almost time for your next dose, take  only that dose. Do not take double or extra doses. What may interact with this medicine? Do not take this medicine with any of the following medications: -ciprofloxacin -cisapride -dofetilide -dronedarone -fluvoxamine -narcotic medicines for cough -pimozide -thiabendazole -thioridazine -ziprasidone This medicine may also interact with the following medications: -acyclovir -alcohol -antihistamines for allergy, cough and cold -baclofen -certain antibiotics like levofloxacin, ofloxacin -certain medicines for anxiety or sleep -certain medicines for blood pressure, heart disease, irregular heart beat -certain medicines for depression like amitriptyline, fluoxetine, sertraline -certain medicines for seizures like phenobarbital, primidone -certain medicines for stomach problems like cimetidine, famotidine -female hormones, like estrogens or progestins and birth control pills, patches, rings, or injections -general anesthetics like halothane, isoflurane, methoxyflurane, propofol -local anesthetics like lidocaine, pramoxine, tetracaine -medicines that relax muscles for surgery -narcotic medicines for pain -other medicines that prolong the QT interval (cause an abnormal heart rhythm) -phenothiazines like chlorpromazine, mesoridazine, prochlorperazine -ticlopidine -zileuton This list may not describe all possible interactions. Give your health care provider a list of all the medicines, herbs, non-prescription drugs, or dietary supplements you use. Also tell them if you smoke, drink alcohol, or use illegal drugs. Some items may interact with your medicine. What should I watch for while using this medicine? Tell your doctor or health care professional if your symptoms do not start to get better or if they get worse. You may get drowsy or dizzy. Do not drive, use machinery, or do anything that needs mental alertness until you know how this medicine affects you. Do not stand or sit up quickly,  especially if you are an older patient. This reduces the risk of dizzy or fainting spells. Alcohol may interfere with the effect of this medicine. Avoid alcoholic drinks. If you are taking another medicine that also causes drowsiness, you may have more side effects. Give your health care provider a list of all medicines you use. Your doctor will tell you how much medicine to take. Do not take more medicine than directed. Call emergency for help if you have problems breathing or unusual sleepiness. Your mouth may get dry. Chewing sugarless gum or sucking hard candy, and drinking plenty of water may help. Contact your doctor if the problem does not go away or is severe. What side effects may I notice from receiving this medicine? Side effects that you should report to your doctor or health care professional as soon as possible: -allergic reactions like skin rash, itching or hives, swelling of the face, lips, or tongue -breathing problems -hallucinations -signs and symptoms of liver injury like dark yellow or brown urine; general ill feeling or flu-like symptoms; light-colored stools; loss of appetite; nausea; right upper quadrant belly pain; unusually weak or tired; yellowing of the eyes or skin -signs and symptoms of low blood pressure like dizziness; feeling faint or lightheaded, falls; unusually weak or tired -unusually slow heartbeat -unusually weak or tired Side effects that usually do not require medical attention (report to your doctor or health care professional if they continue or are bothersome): -blurred vision -constipation -dizziness -dry mouth -tiredness This list may not describe all possible side effects. Call your doctor for medical advice about side effects. You may report side effects to FDA at 1-800-FDA-1088. Where should I keep my medicine? Keep out of the reach of children.  Store at room temperature between 15 and 30 degrees C (59 and 86 degrees F). Throw away any unused  medicine after the expiration date. NOTE: This sheet is a summary. It may not cover all possible information. If you have questions about this medicine, talk to your doctor, pharmacist, or health care provider.  2018 Elsevier/Gold Standard (2014-10-18 13:52:12)  Thank you for coming in today. I hope you feel we met your needs.  Feel free to call PCP if you have any questions or further requests.  Please consider signing up for MyChart if you do not already have it, as this is a great way to communicate with me.  Best,  Whitney McVey, PA-C  IF you received an x-ray today, you will receive an invoice from [facility]. Please contact [facility] at [phone] with questions or concerns regarding your invoice.   IF you received labwork today, you will receive an invoice from [facility]. Please contact [facility] at [phone] with questions or concerns regarding your invoice.   Our billing staff will not be able to assist you with questions regarding bills from these companies.  You will be contacted with the lab results as soon as they are available. The fastest way to get your results is to activate your My Chart account. Instructions are located on the last page of this paperwork. If you have not heard from [place] regarding the results in 2 weeks, please contact this office.

## 2017-05-28 NOTE — Progress Notes (Signed)
Brenda [name]  MRN: [id number] DOB: [birth year]  PCP: Gordy Savers, MD  Subjective:  Pt is a 72 year old female who presents to clinic for sinus pressure x at least 2 weeks. Pain of her left side of her face in front of her ear. "feels like grinding" She has been evaluated by her dentist and her teeth are fine.   She takes Careers adviser daily. Has been taking otcAdvil cold and sinus medication. She believes this is a sinus infection.  Denies nasal drainage, facial swelling, sinus pressure or pain, fever, chills, inability to open or close jaw.  She endorses recent increased life stressors.   Pt has a past medical history of Allergy, Chronic rhinitis (01/19/2007), COLONIC POLYPS, HX OF (06/24/2006), DIVERTICULOSIS, COLON (11/11/2006), HYPERLIPIDEMIA (12/20/2008), HYPERTENSION (06/24/2006), Impaired glucose tolerance, and MENOPAUSAL SYNDROME (06/24/2006).  Review of Systems  Constitutional: Negative for chills, diaphoresis, fatigue and fever.  HENT: Negative for congestion, postnasal drip, rhinorrhea, sinus pressure, sinus pain and sore throat.   Respiratory: Negative for cough, shortness of breath and wheezing.   Musculoskeletal: Positive for arthralgias (left sided facial pain).  Psychiatric/Behavioral: Negative for sleep disturbance.    Patient Active Problem List   Diagnosis Date Noted  . Diabetes mellitus without complication (HCC) 10/11/2014  . Dyslipidemia 12/20/2008  . CHRONIC RHINITIS 01/19/2007  . DIVERTICULOSIS, COLON 11/11/2006  . Essential hypertension 06/24/2006  . MENOPAUSAL SYNDROME 06/24/2006  . COLONIC POLYPS, HX OF 06/24/2006    Current Outpatient Medications on File Prior to Visit  Medication Sig Dispense Refill  . amLODipine (NORVASC) 2.5 MG tablet TAKE 1 TABLET BY MOUTH ONCE DAILY 90 tablet 3  . dorzolamide-timolol (COSOPT) 22.3-6.8 MG/ML ophthalmic solution     . fexofenadine (ALLEGRA ALLERGY) 180 MG tablet Take 180 mg by mouth daily.    Marland Kitchen latanoprost  (XALATAN) 0.005 % ophthalmic solution Place 1 drop into both eyes at bedtime.    Marland Kitchen lisinopril-hydrochlorothiazide (PRINZIDE,ZESTORETIC) 20-12.5 MG tablet Take 1 tablet by mouth daily. 90 tablet 3   No current facility-administered medications on file prior to visit.     No Known Allergies   Objective:  BP (!) 191/81   Pulse 80   Temp 97.9 F (36.6 C) (Oral)   Resp 17   Ht  (1.549 m)   Wt 160 lb (72.6 kg)   SpO2 98%   BMI 30.23 kg/m   Physical Exam  Constitutional: She is oriented to person, place, and time. No distress.  HENT:  Head:    Right Ear: Tympanic membrane normal.  Left Ear: Tympanic membrane normal.  Nose: Mucosal edema present. No rhinorrhea. Right sinus exhibits no maxillary sinus tenderness and no frontal sinus tenderness. Left sinus exhibits no maxillary sinus tenderness and no frontal sinus tenderness.  Mouth/Throat: Oropharynx is clear and moist and mucous membranes are normal.  Cardiovascular: Normal rate, regular rhythm and normal heart sounds.  Pulmonary/Chest: Effort normal and breath sounds normal. No respiratory distress. She has no wheezes. She has no rales.  Neurological: She is alert and oriented to person, place, and time.  Skin: Skin is warm and dry.  Psychiatric: Judgment normal.  Vitals reviewed.   Assessment and Plan :  1. Jaw pain - tizanidine (ZANAFLEX) 2 MG capsule; Take 1 capsule (2 mg total) by mouth 3 (three) times daily.  Dispense: 30 capsule; Refill: 0 - Suspect possible mild TMJ. Plan to treat with zanaflex. rtc in one week if no improvement. Discussed stress relieving techniques.  Consider sinusitis.  2.  Elevated blood pressure reading - Recheck vitals - BP managed by Dr. Julious Payer.   Marco Collie, PA-C  Primary [facility] at [facility] 05/28/2017 3:04 PM

## 2017-05-28 NOTE — Telephone Encounter (Signed)
Copied from CRM [id number]. Topic: Inquiry >> May 28, 2017 11:03 AM Yvonna Alanis wrote: Reason for CRM: Patient called requesting that Dr. Charm Rings assistant call her this morning.      Thank You!!!

## 2017-05-28 NOTE — Telephone Encounter (Signed)
Pt has request for amlodipine 2.5 mg, which has been refilled. Pt also is requesting lisinopril-hydrochlorothiazide 20-12.5 mg. It looks like it was pended on 05/04 as a reorder. Please clarify this.   Provider   Dr. Lesia Hausen LOV  03/31/17 NOV  10/02/17

## 2017-05-29 NOTE — Telephone Encounter (Signed)
Spoke to patient. No further action needed.

## 2017-05-29 NOTE — Telephone Encounter (Signed)
Patient received RX. No further action needed.

## 2017-06-03 ENCOUNTER — Ambulatory Visit: Payer: Self-pay

## 2017-06-03 NOTE — Telephone Encounter (Signed)
Patient called in with c/o "high blood pressure." She says "I went to [place] last week for Jaw Pain and my BP was up then, but I figured it was because I was in pain. 15 today 3. HOW: "How did you obtain the blood pressure?" (e.g., visiting nurse, automatic home BP monitor)     Automatic home BP monitor 4. HISTORY: "Do you have a history of high blood pressure?"     Yes 5. MEDICATIONS: "Are you taking any medications for blood pressure?" "Have you missed any doses recently?"     Yes, no doses missed 6. OTHER SYMPTOMS: "Do you have any symptoms?" (e.g., headache, chest pain, blurred vision, difficulty breathing, weakness)     No 7. PREGNANCY: "Is there any chance you are pregnant?" "When was your last menstrual period?"     No  Protocols used: HIGH BLOOD PRESSURE-A-AH

## 2017-06-04 ENCOUNTER — Ambulatory Visit (INDEPENDENT_AMBULATORY_CARE_PROVIDER_SITE_OTHER): Payer: PPO | Admitting: Internal Medicine

## 2017-06-04 ENCOUNTER — Encounter: Payer: Self-pay | Admitting: Internal Medicine

## 2017-06-04 VITALS — BP 180/80 | HR 57 | Temp 97.9°F | Wt 158.0 lb

## 2017-06-04 DIAGNOSIS — E119 Type 2 diabetes mellitus without complications: Secondary | ICD-10-CM | POA: Diagnosis not present

## 2017-06-04 DIAGNOSIS — I10 Essential (primary) hypertension: Secondary | ICD-10-CM | POA: Diagnosis not present

## 2017-06-04 MED ORDER — AMLODIPINE BESYLATE 5 MG PO TABS: 5.0000 mg | ORAL_TABLET | Freq: Every day | ORAL | 4 refills | Status: DC

## 2017-06-04 NOTE — Progress Notes (Signed)
Subjective:    Patient ID: Brenda [name], female    DOB: [birth year], 72 y.o.   MRN: [id number]  HPI  Lab Results  Component Value Date   HGBA1C 6.5 03/31/2017   72 year old patient who is seen today in follow-up.  She has a history of hypertension.  She was seen at another clinic earlier with elevated blood pressure readings she has been monitoring home blood pressure readings a bit more carefully with consistently elevated systolic readings.  Generally feels improved.  Her left TMJ pain has essentially resolved  Past Medical History:  Diagnosis Date  . Allergy    seasonal  . Chronic rhinitis 01/19/2007  . COLONIC POLYPS, HX OF 06/24/2006  . DIVERTICULOSIS, COLON 11/11/2006  . HYPERLIPIDEMIA 12/20/2008  . HYPERTENSION 06/24/2006  . Impaired glucose tolerance   . MENOPAUSAL SYNDROME 06/24/2006     Social History   Socioeconomic History  . Marital status: Married    Spouse name: Not on file  . Number of children: Not on file  . Years of education: Not on file  . Highest education level: Not on file  Occupational History  . Not on file  Social Needs  . Financial resource strain: Not on file  . Food insecurity:    Worry: Not on file    Inability: Not on file  . Transportation needs:    Medical: Not on file    Non-medical: Not on file  Tobacco Use  . Smoking status: Never Smoker  . Smokeless tobacco: Never Used  Substance and Sexual Activity  . Alcohol use: No  . Drug use: No  . Sexual activity: Not on file  Lifestyle  . Physical activity:    Days per week: Not on file    Minutes per session: Not on file  . Stress: Not on file  Relationships  . Social connections:    Talks on phone: Not on file    Gets together: Not on file    Attends religious service: Not on file    Active member of club or organization: Not on file    Attends meetings of clubs or organizations: Not on file    Relationship status: Not on file  . Intimate partner violence:    Fear of current  or ex partner: Not on file    Emotionally abused: Not on file    Physically abused: Not on file    Forced sexual activity: Not on file  Other Topics Concern  . Not on file  Social History Narrative  . Not on file    Past Surgical History:  Procedure Laterality Date  . BREAST BIOPSY    . COLONOSCOPY    . POLYPECTOMY    . TUBAL LIGATION  1994   benign    Family History  Problem Relation Age of Onset  . Mental illness Mother   . Cancer Father        lung  . Parkinsonism Brother   . Colon cancer Neg Hx     No Known Allergies  Current Outpatient Medications on File Prior to Visit  Medication Sig Dispense Refill  . dorzolamide-timolol (COSOPT) 22.3-6.8 MG/ML ophthalmic solution     . fexofenadine (ALLEGRA ALLERGY) 180 MG tablet Take 180 mg by mouth daily.    Marland Kitchen latanoprost (XALATAN) 0.005 % ophthalmic solution Place 1 drop into both eyes at bedtime.    Marland Kitchen lisinopril-hydrochlorothiazide (PRINZIDE,ZESTORETIC) 20-12.5 MG tablet Take 1 tablet by mouth daily. 90 tablet 3  . tizanidine (ZANAFLEX)  2 MG capsule Take 1 capsule (2 mg total) by mouth 3 (three) times daily. 30 capsule 0   No current facility-administered medications on file prior to visit.     BP (!) 180/80 (BP Location: Right Arm, Patient Position: Sitting, Cuff Size: Large)   Pulse (!) 57   Temp 97.9 F (36.6 C) (Oral)   Wt 158 lb (71.7 kg)   SpO2 98%   BMI 29.85 kg/m    Review of Systems  Constitutional: Negative.   HENT: Negative for congestion, dental problem, hearing loss, rhinorrhea, sinus pressure, sore throat and tinnitus.   Eyes: Negative for pain, discharge and visual disturbance.  Respiratory: Negative for cough and shortness of breath.   Cardiovascular: Negative for chest pain, palpitations and leg swelling.  Gastrointestinal: Negative for abdominal distention, abdominal pain, blood in stool, constipation, diarrhea, nausea and vomiting.  Genitourinary: Negative for difficulty urinating, dysuria,  flank pain, frequency, hematuria, pelvic pain, urgency, vaginal bleeding, vaginal discharge and vaginal pain.  Musculoskeletal: Negative for arthralgias, gait problem and joint swelling.  Skin: Negative for rash.  Neurological: Negative for dizziness, syncope, speech difficulty, weakness, numbness and headaches.  Hematological: Negative for adenopathy.  Psychiatric/Behavioral: Negative for agitation, behavioral problems and dysphoric mood. The patient is nervous/anxious.        Objective:   Physical Exam  Constitutional: She appears well-developed and well-nourished. No distress.  Blood pressure 144/80          Assessment & Plan:  Essential hypertension.  Blood pressure readings has shown consistently high systolic readings will increase amlodipine to 5 mg daily.  Continue home blood pressure monitoring  Return as scheduled this summer for her annual exam  [name]

## 2017-06-04 NOTE — Patient Instructions (Addendum)
Increase amlodipine to 5 mg daily.  Your new prescription will be a 5 mg tablet but okay to take  TWO 2.5 mg tablets once daily until completed  Limit your sodium (Salt) intake  Please check your blood pressure on a regular basis.  If it is consistently greater than 150/90, please make an office appointment.

## 2017-06-12 ENCOUNTER — Telehealth: Payer: Self-pay

## 2017-06-12 NOTE — Telephone Encounter (Signed)
Copied from CRM [id number]. Topic: General - Other >> Jun 11, 2017  8:50 AM Percival Spanish wrote:  Pt call to today to say she is a lot better but at night she can feel the pain a little bit and is asking if she may need a few more pills for her jaw pain   [phone]

## 2017-06-12 NOTE — Telephone Encounter (Signed)
Patient will need to return to office to be evaluated if she needs more medication. Please call patient to schedule.

## 2017-06-12 NOTE — Telephone Encounter (Signed)
Copied from CRM [id number]. Topic: Inquiry >> Jun 12, 2017  2:16 PM Windy Kalata, NT wrote: Reason for CRM: patient is calling and states 05/28/17 she seen [facility] and she would like to speak with her. Please advise.

## 2017-06-13 NOTE — Telephone Encounter (Signed)
mychart message sent to pt about making an apt °

## 2017-06-14 ENCOUNTER — Encounter: Payer: Self-pay | Admitting: Physician Assistant

## 2017-06-14 ENCOUNTER — Ambulatory Visit (INDEPENDENT_AMBULATORY_CARE_PROVIDER_SITE_OTHER): Payer: PPO | Admitting: Physician Assistant

## 2017-06-14 DIAGNOSIS — R6884 Jaw pain: Secondary | ICD-10-CM | POA: Diagnosis not present

## 2017-06-14 MED ORDER — TIZANIDINE HCL 2 MG PO CAPS: 2.0000 mg | ORAL_CAPSULE | Freq: Three times a day (TID) | ORAL | 0 refills | Status: DC

## 2017-06-14 MED ORDER — NAPROXEN 250 MG PO TABS
250.0000 mg | ORAL_TABLET | Freq: Two times a day (BID) | ORAL | 0 refills | Status: DC
Start: 2017-06-14 — End: 2017-10-02

## 2017-06-14 NOTE — Progress Notes (Signed)
   Brenda [name]  MRN: [id number] DOB: 1945/04/28  PCP: Gordy Savers, MD  Subjective:  Pt is a 72 year old female who presents to clinic for f/u jaw pain.  She was here 5/8 and diagnosed with jaw pain and treated with zanaflex. Advised pt to consider stress relieving techniques, as she does addmit to increased life stressors.  Today she states her pain has improved about 50%. She is unsure if she grinds her teeth at night.    Denies nasal drainage, facial swelling, sinus pressure or pain, fever, chills, inability to open or close jaw.   Pt  has a past medical history of Allergy, Chronic rhinitis (01/19/2007), COLONIC POLYPS, HX OF (06/24/2006), DIVERTICULOSIS, COLON (11/11/2006), HYPERLIPIDEMIA (12/20/2008), HYPERTENSION (06/24/2006), Impaired glucose tolerance, and MENOPAUSAL SYNDROME (06/24/2006).  Review of Systems  Constitutional: Negative for appetite change and unexpected weight change.  HENT: Positive for dental problem (jaw pain). Negative for ear pain, facial swelling and mouth sores.     Patient Active Problem List   Diagnosis Date Noted  . Diabetes mellitus without complication (HCC) 10/11/2014  . Dyslipidemia 12/20/2008  . CHRONIC RHINITIS 01/19/2007  . DIVERTICULOSIS, COLON 11/11/2006  . Essential hypertension 06/24/2006  . MENOPAUSAL SYNDROME 06/24/2006  . COLONIC POLYPS, HX OF 06/24/2006    Current Outpatient Medications on File Prior to Visit  Medication Sig Dispense Refill  . amLODipine (NORVASC) 5 MG tablet Take 1 tablet (5 mg total) by mouth daily. 90 tablet 4  . dorzolamide-timolol (COSOPT) 22.3-6.8 MG/ML ophthalmic solution     . fexofenadine (ALLEGRA ALLERGY) 180 MG tablet Take 180 mg by mouth daily.    Marland Kitchen latanoprost (XALATAN) 0.005 % ophthalmic solution Place 1 drop into both eyes at bedtime.    Marland Kitchen lisinopril-hydrochlorothiazide (PRINZIDE,ZESTORETIC) 20-12.5 MG tablet Take 1 tablet by mouth daily. 90 tablet 3  . tizanidine (ZANAFLEX) 2 MG capsule Take 1  capsule (2 mg total) by mouth 3 (three) times daily. 30 capsule 0   No current facility-administered medications on file prior to visit.     No Known Allergies   Objective:  BP 140/90 (BP Location: Left Arm, Patient Position: Sitting, Cuff Size: Normal)   Pulse 87   Temp 98.2 F (36.8 C) (Oral)   Resp 16   Ht 5' (1.524 m)   Wt 158 lb (71.7 kg)   SpO2 98%   BMI 30.86 kg/m   Physical Exam  Constitutional: She appears well-developed and well-nourished.  HENT:  Head:    Right Ear: Tympanic membrane and ear canal normal. No tenderness. No mastoid tenderness.  Left Ear: Tympanic membrane and ear canal normal. No tenderness. No mastoid tenderness.  Mouth/Throat: Normal dentition. No dental abscesses.    Assessment and Plan :  1. Jaw pain -Patient presents for follow-up jaw pain, suspected to be secondary to TMJ.  She was prescribed Zanaflex last week and has improved about 50%.  She does admit to increased recent life stressors.  Advised nightguard to help with potential grinding of teeth.  Return to clinic if no improvement.   Marco Collie, PA-C  Primary Care at [facility] 06/14/2017 3:27 PM

## 2017-06-14 NOTE — Patient Instructions (Addendum)
Start taking Naproxen '250mg'$  twice daily for 10 days. (You can increase the dose to '500mg'$  twice daily if needed.) Naproxen in an NSAID. Do not use with any other otc pain medication other than tylenol/acetaminophen - so no aleve, ibuprofen, motrin, advil, etc.  Take Zanaflex at bedtime for the next 7-10 day.  Try using a mouth guard at night while you sleep.   I am treating you today for Temporomandibular joint (TMJ) disorders.  What are temporomandibular joint disorders?-Temporomandibular joint disorders are problems with the jaw joint and the muscles around it. The jaw joint, called the "temporomandibular joint," is located in front of the ear where the jawbone connects to your head. To feel the joint, place your finger on your cheek just in front of your ear and then open and close your mouth. When doctors refer to temporomandibular joint disorders, they often use the term "TMJ" for short. TMJ disorders can be caused by many problems, including arthritis. Sometimes TMJ is due to a combination of stress, jaw clenching, teeth grinding, and other things that strain the jaw joint and the muscles around it. Some people with TMJ also have anxiety, depression, or an increased awareness of pain.  What are the symptoms of TMJ?-The main symptom of TMJ is a dull pain on just 1 side of the face, near the ear. Sometimes the pain also affects the ear, jaw, or back of the neck. Some people have headaches with TMJ. The pain of TMJ is typically constant, but may come and go. It is usually worse with jaw movement. People with TMJ might hear a clicking or popping sound or have a "crunchy feeling" in the joint when they open and close their mouth.  How is TMJ treated?-No single treatment for TMJ works for everyone. Most of the time, medicines and simple lifestyle changes can help. Most patients get better over time, even without treatment, so patience is important. Your doctor or nurse will help you find the  right mix of treatments for you. He or she might refer you to a dentist who specializes in TMJ. Treatment options include: ?Education and self-care - This includes following instructions from your doctor about how to avoid things that trigger TMJ pain. It also involves learning different ways to help you relax and manage stress. Some people might also improve with simple jaw exercises they can do on their own. ?Medicines to relieve pain and relax the muscles - There are several types of medicines used to treat TMJ. These include nonsteroidal antiinflammatory drugs (NSAIDs), muscle relaxants, and certain medicines used for depression. (Medicines for depression can relieve pain even in people who are not depressed.) Your doctor will decide which medicine or group of medicines is best for you. ?Devices - These are called "bite plates" or "occlusal splints." They fit in your mouth and keep you from grinding your teeth at night. They are made out of either a hard or soft plastic and might be made specially to fit your mouth. If you have sleep apnea, be sure to tell your doctor as the bite plate or splint might make your sleep apnea worse. If these treatments don't help, your doctor might suggest that you see a specialist, such as an oral Psychologist, sport and exercise. The specialist might use medicines given by injection (shots) to treat the pain. It is rare that people need surgery for TMJ.   Thank you for coming in today. I hope you feel we met your needs.  Feel free to call PCP if  you have any questions or further requests.  Please consider signing up for MyChart if you do not already have it, as this is a great way to communicate with me.  Best,  Whitney McVey, PA-C  IF you received an x-ray today, you will receive an invoice from [facility]. Please contact [facility] at [phone] with questions or concerns regarding your invoice.   IF you received labwork today, you will receive an invoice from  [facility]. Please contact [facility] at [phone] with questions or concerns regarding your invoice.   Our billing staff will not be able to assist you with questions regarding bills from these companies.  You will be contacted with the lab results as soon as they are available. The fastest way to get your results is to activate your My Chart account. Instructions are located on the last page of this paperwork. If you have not heard from [place] regarding the results in 2 weeks, please contact this office.

## 2017-10-02 ENCOUNTER — Encounter: Payer: Self-pay | Admitting: Internal Medicine

## 2017-10-02 ENCOUNTER — Ambulatory Visit (INDEPENDENT_AMBULATORY_CARE_PROVIDER_SITE_OTHER): Payer: PPO | Admitting: Internal Medicine

## 2017-10-02 VITALS — BP 130/78 | HR 56 | Temp 98.2°F | Ht 60.0 in | Wt 158.8 lb

## 2017-10-02 DIAGNOSIS — Z23 Encounter for immunization: Secondary | ICD-10-CM | POA: Diagnosis not present

## 2017-10-02 DIAGNOSIS — E785 Hyperlipidemia, unspecified: Secondary | ICD-10-CM | POA: Diagnosis not present

## 2017-10-02 DIAGNOSIS — E119 Type 2 diabetes mellitus without complications: Secondary | ICD-10-CM | POA: Diagnosis not present

## 2017-10-02 DIAGNOSIS — I10 Essential (primary) hypertension: Secondary | ICD-10-CM

## 2017-10-02 DIAGNOSIS — Z0000 Encounter for general adult medical examination without abnormal findings: Secondary | ICD-10-CM

## 2017-10-02 LAB — POCT GLYCOSYLATED HEMOGLOBIN (HGB A1C): Hemoglobin A1C: 6 % — AB (ref 4.0–5.6)

## 2017-10-02 MED ORDER — AMLODIPINE BESYLATE 5 MG PO TABS: 5.0000 mg | ORAL_TABLET | Freq: Every day | ORAL | 4 refills | Status: DC

## 2017-10-02 MED ORDER — LISINOPRIL-HYDROCHLOROTHIAZIDE 20-12.5 MG PO TABS: 1.0000 | ORAL_TABLET | Freq: Every day | ORAL | 3 refills | Status: DC

## 2017-10-02 NOTE — Progress Notes (Signed)
Subjective:    Patient ID: Brenda [name], female    DOB: [birth year], 72 y.o.   MRN: [id number]  HPI  72 year old patient who is seen today for a annual preventive health examination as well as a subsequent Medicare wellness visit She has a history of diet-controlled diabetes.  She has essential hypertension.  She is followed by ophthalmology at least annually for glaucoma. She has remote history of colonic polyps.  Her last colonoscopy was about 5 years ago and was free of polyps at 10-year interval was suggested  Doing well without concerns or complaints. She is scheduled for follow-up eye examination next month  Past Medical History:  Diagnosis Date  . Allergy    seasonal  . Chronic rhinitis 01/19/2007  . COLONIC POLYPS, HX OF 06/24/2006  . DIVERTICULOSIS, COLON 11/11/2006  . HYPERLIPIDEMIA 12/20/2008  . HYPERTENSION 06/24/2006  . Impaired glucose tolerance   . MENOPAUSAL SYNDROME 06/24/2006     Social History   Socioeconomic History  . Marital status: Married    Spouse name: Not on file  . Number of children: Not on file  . Years of education: Not on file  . Highest education level: Not on file  Occupational History  . Not on file  Social Needs  . Financial resource strain: Not on file  . Food insecurity:    Worry: Not on file    Inability: Not on file  . Transportation needs:    Medical: Not on file    Non-medical: Not on file  Tobacco Use  . Smoking status: Never Smoker  . Smokeless tobacco: Never Used  Substance and Sexual Activity  . Alcohol use: No  . Drug use: No  . Sexual activity: Not on file  Lifestyle  . Physical activity:    Days per week: Not on file    Minutes per session: Not on file  . Stress: Not on file  Relationships  . Social connections:    Talks on phone: Not on file    Gets together: Not on file    Attends religious service: Not on file    Active member of club or organization: Not on file    Attends meetings of clubs or  organizations: Not on file    Relationship status: Not on file  . Intimate partner violence:    Fear of current or ex partner: Not on file    Emotionally abused: Not on file    Physically abused: Not on file    Forced sexual activity: Not on file  Other Topics Concern  . Not on file  Social History Narrative  . Not on file    Past Surgical History:  Procedure Laterality Date  . BREAST BIOPSY    . COLONOSCOPY    . POLYPECTOMY    . TUBAL LIGATION  1994   benign    Family History  Problem Relation Age of Onset  . Mental illness Mother   . Cancer Father        lung  . Parkinsonism Brother   . Colon cancer Neg Hx     No Known Allergies  Current Outpatient Medications on File Prior to Visit  Medication Sig Dispense Refill  . amLODipine (NORVASC) 5 MG tablet Take 1 tablet (5 mg total) by mouth daily. 90 tablet 4  . dorzolamide-timolol (COSOPT) 22.3-6.8 MG/ML ophthalmic solution     . fexofenadine (ALLEGRA ALLERGY) 180 MG tablet Take 180 mg by mouth daily.    Marland Kitchen latanoprost (XALATAN)  0.005 % ophthalmic solution Place 1 drop into both eyes at bedtime.    Marland Kitchen. lisinopril-hydrochlorothiazide (PRINZIDE,ZESTORETIC) 20-12.5 MG tablet Take 1 tablet by mouth daily. 90 tablet 3   No current facility-administered medications on file prior to visit.     BP 130/78 (BP Location: Right Arm, Patient Position: Sitting, Cuff Size: Large)   Pulse (!) 56   Temp 98.2 F (36.8 C) (Oral)   Ht 5' (1.524 m)   Wt 158 lb 12.8 oz (72 kg)   SpO2 99%   BMI 31.01 kg/m   Subsequent Medicare wellness visit  1. Risk factors, based on past  M,S,F history.  Cardiovascular risk factors include diet-controlled diabetes and hypertension.  2.  Physical activities: Remains quite active.  Does use a home treadmill  3.  Depression/mood: No history major depression or mood disorder  4.  Hearing: No deficits  5.  ADL's: Independent  6.  Fall risk: Low  7.  Home safety: No problems identified  8.   Height weight, and visual acuity; height and weight stable no change in visual acuity  9.  Counseling: Continue heart healthy diet and active lifestyle  10. Lab orders based on risk factors: Laboratory studies were reviewed from the spring.  Hemoglobin A1c checked today  11. Referral : Follow-up ophthalmology  12. Care plan: Continue efforts at aggressive risk factor modification  13. Cognitive assessment: Alert and appropriate normal affect.  No cognitive dysfunction  14. Screening: Patient provided with a written and personalized 5-10 year screening schedule in the AVS.    15. Provider List Update: Ophthalmology primary care and GI    Review of Systems  Constitutional: Negative.   HENT: Negative for congestion, dental problem, hearing loss, rhinorrhea, sinus pressure, sore throat and tinnitus.   Eyes: Negative for pain, discharge and visual disturbance.  Respiratory: Negative for cough and shortness of breath.   Cardiovascular: Negative for chest pain, palpitations and leg swelling.  Gastrointestinal: Negative for abdominal distention, abdominal pain, blood in stool, constipation, diarrhea, nausea and vomiting.  Genitourinary: Negative for difficulty urinating, dysuria, flank pain, frequency, hematuria, pelvic pain, urgency, vaginal bleeding, vaginal discharge and vaginal pain.  Musculoskeletal: Negative for arthralgias, gait problem and joint swelling.  Skin: Negative for rash.  Neurological: Negative for dizziness, syncope, speech difficulty, weakness, numbness and headaches.  Hematological: Negative for adenopathy.  Psychiatric/Behavioral: Negative for agitation, behavioral problems and dysphoric mood. The patient is not nervous/anxious.        Objective:   Physical Exam  Constitutional: She is oriented to person, place, and time. She appears well-developed and well-nourished.  HENT:  Head: Normocephalic and atraumatic.  Right Ear: External ear normal.  Left Ear: External  ear normal.  Mouth/Throat: Oropharynx is clear and moist.  Eyes: Conjunctivae and EOM are normal.  Neck: Normal range of motion. Neck supple. No JVD present. No thyromegaly present.  Cardiovascular: Normal rate, regular rhythm, normal heart sounds and intact distal pulses.  No murmur heard. Pulmonary/Chest: Effort normal and breath sounds normal. She has no wheezes. She has no rales.  Abdominal: Soft. Bowel sounds are normal. She exhibits no distension and no mass. There is no tenderness. There is no rebound and no guarding.  Musculoskeletal: Normal range of motion. She exhibits no edema or tenderness.  Neurological: She is alert and oriented to person, place, and time. She has normal reflexes. She displays normal reflexes. No cranial nerve deficit. She exhibits normal muscle tone. Coordination normal.  Skin: Skin is warm and dry. No  rash noted.  Psychiatric: She has a normal mood and affect. Her behavior is normal.          Assessment & Plan:  Preventive health examination Subsequent Medicare wellness visit Diet-controlled diabetes.   Lab Results  Component Value Date   HGBA1C 6.5 03/31/2017  Essential hypertension stable  Repeat hemoglobin A1c today 6.0.  Medications updated Patient will follow-up with a new provider in 6 months Continue home blood pressure monitoring  Gordy Savers

## 2017-10-02 NOTE — Progress Notes (Signed)
   Subjective:    Patient ID: Brenda [name], female    DOB: [birth year], 72 y.o.   MRN: [id number]  HPI    Review of Systems     Objective:   Physical Exam        Assessment & Plan:

## 2017-10-02 NOTE — Patient Instructions (Signed)
Limit your sodium (Salt) intake    It is important that you exercise regularly, at least 20 minutes 3 to 4 times per week.  If you develop chest pain or shortness of breath seek  medical attention.   Please check your hemoglobin A1c every 3-6 months  Please see your eye doctor yearly to check for diabetic eye damage  Return in 6 months for follow-up

## 2017-11-10 DIAGNOSIS — H401132 Primary open-angle glaucoma, bilateral, moderate stage: Secondary | ICD-10-CM | POA: Diagnosis not present

## 2017-11-10 DIAGNOSIS — H43812 Vitreous degeneration, left eye: Secondary | ICD-10-CM | POA: Diagnosis not present

## 2017-11-10 DIAGNOSIS — H25013 Cortical age-related cataract, bilateral: Secondary | ICD-10-CM | POA: Diagnosis not present

## 2017-11-10 DIAGNOSIS — H2513 Age-related nuclear cataract, bilateral: Secondary | ICD-10-CM | POA: Diagnosis not present

## 2018-04-23 ENCOUNTER — Ambulatory Visit: Payer: Medicare Other | Admitting: Family Medicine

## 2018-04-27 ENCOUNTER — Ambulatory Visit (INDEPENDENT_AMBULATORY_CARE_PROVIDER_SITE_OTHER): Payer: PPO | Admitting: Emergency Medicine

## 2018-04-27 ENCOUNTER — Other Ambulatory Visit: Payer: Self-pay

## 2018-04-27 ENCOUNTER — Encounter: Payer: Self-pay | Admitting: Emergency Medicine

## 2018-04-27 DIAGNOSIS — R7303 Prediabetes: Secondary | ICD-10-CM

## 2018-04-27 DIAGNOSIS — I10 Essential (primary) hypertension: Secondary | ICD-10-CM

## 2018-04-27 DIAGNOSIS — Z7689 Persons encountering health services in other specified circumstances: Secondary | ICD-10-CM

## 2018-04-27 NOTE — Progress Notes (Signed)
Telemedicine Encounter- SOAP NOTE Established Patient  This telephone encounter was conducted with the patient's (or proxy's) verbal consent via audio telecommunications: yes/no: Yes Patient was instructed to have this encounter in a suitably private space; and to only have persons present to whom they give permission to participate. In addition, patient identity was confirmed by use of name plus two identifiers (DOB and address).  I discussed the limitations, risks, security and privacy concerns of performing an evaluation and management service by telephone and the availability of in person appointments. I also discussed with the patient that there may be a patient responsible charge related to this service. The patient expressed understanding and agreed to proceed.  I spent a total of TIME; 0 MIN TO 60 MIN: 15 minutes talking with the patient or their proxy.  Chief Complaint  Patient presents with  . Establish Care  . Hypertension    per patient she does not have diabetes - last HgA1c was 6.0 on 10/02/2017    Subjective   Brenda [name] is a 73 y.o. female.  First visit with me.  Telephone visit today to establish care.  Has a history of hypertension and prediabetes.  Has no complaints or medical concerns today. Blood pressure at home has been within normal limits.  HPI   Patient Active Problem List   Diagnosis Date Noted  . Diabetes mellitus without complication (HCC) 10/11/2014  . Dyslipidemia 12/20/2008  . CHRONIC RHINITIS 01/19/2007  . DIVERTICULOSIS, COLON 11/11/2006  . Essential hypertension 06/24/2006  . MENOPAUSAL SYNDROME 06/24/2006  . COLONIC POLYPS, HX OF 06/24/2006    Past Medical History:  Diagnosis Date  . Allergy    seasonal  . Chronic rhinitis 01/19/2007  . COLONIC POLYPS, HX OF 06/24/2006  . DIVERTICULOSIS, COLON 11/11/2006  . HYPERLIPIDEMIA 12/20/2008  . HYPERTENSION 06/24/2006  . Impaired glucose tolerance   . MENOPAUSAL SYNDROME 06/24/2006     Current Outpatient Medications  Medication Sig Dispense Refill  . amLODipine (NORVASC) 5 MG tablet Take 1 tablet (5 mg total) by mouth daily. 90 tablet 4  . dorzolamide-timolol (COSOPT) 22.3-6.8 MG/ML ophthalmic solution     . fexofenadine (ALLEGRA ALLERGY) 180 MG tablet Take 180 mg by mouth daily.    Marland Kitchen. latanoprost (XALATAN) 0.005 % ophthalmic solution Place 1 drop into both eyes at bedtime.    Marland Kitchen. lisinopril-hydrochlorothiazide (PRINZIDE,ZESTORETIC) 20-12.5 MG tablet Take 1 tablet by mouth daily. 90 tablet 3   No current facility-administered medications for this visit.     No Known Allergies  Social History   Socioeconomic History  . Marital status: Married    Spouse name: Not on file  . Number of children: Not on file  . Years of education: Not on file  . Highest education level: Not on file  Occupational History  . Not on file  Social Needs  . Financial resource strain: Not on file  . Food insecurity:    Worry: Not on file    Inability: Not on file  . Transportation needs:    Medical: Not on file    Non-medical: Not on file  Tobacco Use  . Smoking status: Never Smoker  . Smokeless tobacco: Never Used  Substance and Sexual Activity  . Alcohol use: No  . Drug use: No  . Sexual activity: Not on file  Lifestyle  . Physical activity:    Days per week: Not on file    Minutes per session: Not on file  . Stress: Not on file  Relationships  . Social connections:    Talks on phone: Not on file    Gets together: Not on file    Attends religious service: Not on file    Active member of club or organization: Not on file    Attends meetings of clubs or organizations: Not on file    Relationship status: Not on file  . Intimate partner violence:    Fear of current or ex partner: Not on file    Emotionally abused: Not on file    Physically abused: Not on file    Forced sexual activity: Not on file  Other Topics Concern  . Not on file  Social History Narrative  . Not on  file    Review of Systems  Constitutional: Negative for chills and fever.  HENT: Negative for congestion and sore throat.   Respiratory: Negative for cough, shortness of breath and wheezing.   Cardiovascular: Negative for chest pain and palpitations.  Gastrointestinal: Negative for abdominal pain, diarrhea, nausea and vomiting.  Genitourinary: Negative.   Musculoskeletal: Negative for myalgias.  Skin: Negative.  Negative for rash.  Neurological: Negative for dizziness and headaches.  All other systems reviewed and are negative.   Objective   Vitals as reported by the patient: Normal blood pressure readings at home There were no vitals filed for this visit.  No medical concerns identified during this visit. Follow-up in 3 to 6 months. Continue present medications.  [name] was seen today for establish care and hypertension.  Diagnoses and all orders for this visit:  Hypertension, essential  Prediabetes  Encounter to establish care     I discussed the assessment and treatment plan with the patient. The patient was provided an opportunity to ask questions and all were answered. The patient agreed with the plan and demonstrated an understanding of the instructions.   The patient was advised to call back or seek an in-person evaluation if the symptoms worsen or if the condition fails to improve as anticipated.  I provided 15 minutes of non-face-to-face time during this encounter.  Georgina Quint, MD  Primary Care at [facility]

## 2018-04-27 NOTE — Patient Instructions (Signed)
° ° ° °  If you have lab work done today you will be contacted with your lab results within the next 2 weeks.  If you have not heard from us then please contact us. The fastest way to get your results is to register for My Chart. ° ° °IF you received an x-ray today, you will receive an invoice from [facility]. Please contact [facility] at [phone] with questions or concerns regarding your invoice.  ° °IF you received labwork today, you will receive an invoice from [facility]. Please contact [facility] at [phone] with questions or concerns regarding your invoice.  ° °Our billing staff will not be able to assist you with questions regarding bills from these companies. ° °You will be contacted with the lab results as soon as they are available. The fastest way to get your results is to activate your My Chart account. Instructions are located on the last page of this paperwork. If you have not heard from us regarding the results in 2 weeks, please contact this office. °  ° ° ° °

## 2018-06-19 DIAGNOSIS — H401132 Primary open-angle glaucoma, bilateral, moderate stage: Secondary | ICD-10-CM | POA: Diagnosis not present

## 2018-06-24 DIAGNOSIS — Z1231 Encounter for screening mammogram for malignant neoplasm of breast: Secondary | ICD-10-CM | POA: Diagnosis not present

## 2018-06-24 LAB — HM MAMMOGRAPHY

## 2018-06-30 ENCOUNTER — Encounter: Payer: Self-pay | Admitting: *Deleted

## 2018-08-11 ENCOUNTER — Other Ambulatory Visit: Payer: Self-pay

## 2018-08-11 ENCOUNTER — Telehealth: Payer: Self-pay | Admitting: Internal Medicine

## 2018-08-11 DIAGNOSIS — I10 Essential (primary) hypertension: Secondary | ICD-10-CM

## 2018-08-11 MED ORDER — AMLODIPINE BESYLATE 5 MG PO TABS: 5.0000 mg | ORAL_TABLET | Freq: Every day | ORAL | 1 refills | Status: DC

## 2018-08-11 NOTE — Telephone Encounter (Signed)
Copied from [place] [id number]. Topic: Quick Communication - Rx Refill/Question >> Aug 11, 2018  9:04 AM Nils Flack wrote: Medication: amLODipine (NORVASC) 5 MG tablet  Has the patient contacted their pharmacy? No, new dr  (Agent: If no, request that the patient contact the pharmacy for the refill.) (Agent: If yes, when and what did the pharmacy advise?)  Preferred Pharmacy (with phone number or street name):[facility] [address]   Agent: Please be advised that RX refills may take up to 3 business days. We ask that you follow-up with your pharmacy.

## 2018-08-11 NOTE — Telephone Encounter (Signed)
Rx sent to pharmacy   

## 2018-10-08 ENCOUNTER — Ambulatory Visit (INDEPENDENT_AMBULATORY_CARE_PROVIDER_SITE_OTHER): Payer: PPO | Admitting: Emergency Medicine

## 2018-10-08 ENCOUNTER — Other Ambulatory Visit: Payer: Self-pay

## 2018-10-08 DIAGNOSIS — Z23 Encounter for immunization: Secondary | ICD-10-CM | POA: Diagnosis not present

## 2018-10-08 NOTE — Progress Notes (Signed)
Flu shot

## 2018-11-13 ENCOUNTER — Other Ambulatory Visit: Payer: Self-pay | Admitting: Emergency Medicine

## 2018-11-13 DIAGNOSIS — I10 Essential (primary) hypertension: Secondary | ICD-10-CM

## 2018-11-13 MED ORDER — LISINOPRIL-HYDROCHLOROTHIAZIDE 20-12.5 MG PO TABS: 1.0000 | ORAL_TABLET | Freq: Every day | ORAL | 3 refills | Status: DC

## 2018-11-13 NOTE — Telephone Encounter (Signed)
Requested medication (s) are due for refill today: yes  Requested medication (s) are on the active medication list: yes  Last refill:  09/2017  Future visit scheduled: no  Notes to clinic:  Last filled by different provider    Requested Prescriptions  Pending Prescriptions Disp Refills   lisinopril-hydrochlorothiazide (ZESTORETIC) 20-12.5 MG tablet 90 tablet 3    Sig: Take 1 tablet by mouth daily.     Cardiovascular:  ACEI + Diuretic Combos Failed - 11/13/2018 10:54 AM      Failed - Na in normal range and within 180 days    Sodium  Date Value Ref Range Status  03/31/2017 143 135 - 145 mEq/L Final         Failed - K in normal range and within 180 days    Potassium  Date Value Ref Range Status  03/31/2017 3.6 3.5 - 5.1 mEq/L Final         Failed - Cr in normal range and within 180 days    Creatinine, Ser  Date Value Ref Range Status  03/31/2017 0.74 0.40 - 1.20 mg/dL Final         Failed - Ca in normal range and within 180 days    Calcium  Date Value Ref Range Status  03/31/2017 9.9 8.4 - 10.5 mg/dL Final         Failed - Valid encounter within last 6 months    Recent Outpatient Visits          1 month ago Need for immunization against influenza   Primary Care at [facility], Ines Bloomer, MD   6 months ago Hypertension, essential   Primary Care at [place], Ines Bloomer, MD   1 year ago Diabetes mellitus without complication [facility])   [place] at [facility], Doretha Sou, MD   1 year ago Jaw pain   Primary Care at [facility], Gelene Mink, PA-C   1 year ago Essential hypertension   Therapist, music at [place], Doretha Sou, MD             Passed - Patient is not pregnant      Passed - Last BP in normal range    BP Readings from Last 1 Encounters:  10/02/17 130/78

## 2018-11-13 NOTE — Telephone Encounter (Signed)
Medication Refill - Medication:  lisinopril-hydrochlorothiazide (PRINZIDE,ZESTORETIC) 20-12.5 MG tablet [[id number]]    Has the patient contacted their pharmacy? No. (Agent: If no, request that the patient contact the pharmacy for the refill.) (Agent: If yes, when and what did the pharmacy advise?)  Preferred Pharmacy (with phone number or street name):  [place] [place] (Phone     Agent: Please be advised that RX refills may take up to 3 business days. We ask that you follow-up with your pharmacy.

## 2018-12-25 DIAGNOSIS — H401132 Primary open-angle glaucoma, bilateral, moderate stage: Secondary | ICD-10-CM | POA: Diagnosis not present

## 2019-02-02 ENCOUNTER — Other Ambulatory Visit: Payer: Self-pay | Admitting: Emergency Medicine

## 2019-02-02 ENCOUNTER — Telehealth: Payer: Self-pay | Admitting: *Deleted

## 2019-02-02 DIAGNOSIS — I10 Essential (primary) hypertension: Secondary | ICD-10-CM

## 2019-02-02 NOTE — Telephone Encounter (Signed)
30 days sent in   Patient will need an appointment

## 2019-02-02 NOTE — Telephone Encounter (Signed)
Requested medication (s) are due for refill today: yes  Requested medication (s) are on the active medication list: yes  Last refill:  11/05/2018  Future visit scheduled: no  Notes to clinic: no valid encounter within last 6 months Review for refill   Requested Prescriptions  Pending Prescriptions Disp Refills   amLODipine (NORVASC) 5 MG tablet [Pharmacy Med Name: amLODIPine Besylate 5 MG Oral Tablet] 90 tablet 0    Sig: Take 1 tablet by mouth once daily      Cardiovascular:  Calcium Channel Blockers Failed - 02/02/2019 10:18 AM      Failed - Valid encounter within last 6 months    Recent Outpatient Visits           3 months ago Need for immunization against influenza   Primary Care at [facility], Eilleen Kempf, MD   9 months ago Hypertension, essential   Primary Care at [place], Eilleen Kempf, MD   1 year ago Diabetes mellitus without complication [facility])   Adult nurse [facility] at [facility], Janett Labella, MD   1 year ago Jaw pain   Primary Care at [facility], Madelaine Bhat, PA-C   1 year ago Essential hypertension   Nature conservation officer at [place], Janett Labella, MD              Passed - Last BP in normal range    BP Readings from Last 1 Encounters:  10/02/17 130/78

## 2019-02-02 NOTE — Telephone Encounter (Signed)
Called pt made appointment FR

## 2019-02-11 ENCOUNTER — Ambulatory Visit (INDEPENDENT_AMBULATORY_CARE_PROVIDER_SITE_OTHER): Payer: PPO | Admitting: Emergency Medicine

## 2019-02-11 VITALS — BP 130/78 | Ht 61.0 in | Wt 158.0 lb

## 2019-02-11 DIAGNOSIS — Z0000 Encounter for general adult medical examination without abnormal findings: Secondary | ICD-10-CM

## 2019-02-11 NOTE — Progress Notes (Signed)
Presents today for [employer] Visit   Date of last exam: 04/27/2018  Interpreter used for this visit?  No  I connected with  Brenda [name] on 02/11/19 by a telephone and verified that I am speaking with the correct person using two identifiers.   I discussed the limitations of evaluation and management by telemedicine. The patient expressed understanding and agreed to proceed.    Patient Care Team: Horald Pollen, MD as PCP - General (Internal Medicine)   Other items to address today:  Discussed Eye/Dental Discussed immunizations Follow up scheduled [facility] 02/16/2019   Other Screening: Last screening for diabetes: 03/31/2017 Last lipid screening: 3/11/12019  ADVANCE DIRECTIVES: Discussed: yes On File: no copy requested Materials Provided: NO    Immunization status:  Immunization History  Administered Date(s) Administered  . Fluad Quad(high Dose 65+) 10/08/2018  . Influenza Split 10/03/2011  . Influenza Whole 10/28/2007, 10/02/2010  . Influenza, High Dose Seasonal PF 10/11/2014, 10/04/2015, 10/18/2016  . Influenza,inj,Quad PF,6+ Mos 09/28/2012, 10/10/2013  . Influenza-Unspecified 10/14/2017  . Pneumococcal Conjugate-13 01/27/2013  . Pneumococcal Polysaccharide-23 11/11/2006, 04/11/2014  . Zoster 08/17/2012     Health Maintenance Due  Topic Date Due  . HEMOGLOBIN A1C  04/02/2018  . FOOT EXAM  10/03/2018     Functional Status Survey: Is the patient deaf or have difficulty hearing?: No Does the patient have difficulty seeing, even when wearing glasses/contacts?: No Does the patient have difficulty concentrating, remembering, or making decisions?: No Does the patient have difficulty walking or climbing stairs?: No Does the patient have difficulty dressing or bathing?: No Does the patient have difficulty doing errands alone such as visiting a doctor's office or shopping?: No   6CIT Screen 02/11/2019  What Year? 0 points  What  month? 0 points  What time? 0 points  Count back from 20 0 points  Months in reverse 0 points  Repeat phrase 0 points  Total Score 0        Clinical Support from 02/11/2019 in Primary Care at [place]  AUDIT-C Score  0       Home Environment:   Lives in one story home No trouble climbing stairs No scattered rugs No grab bars Adequate lighting/no clutter  Timed warm up N/A  Patient Active Problem List   Diagnosis Date Noted  . Diabetes mellitus without complication ([place]) 18/29/9371  . Dyslipidemia 12/20/2008  . CHRONIC RHINITIS 01/19/2007  . DIVERTICULOSIS, COLON 11/11/2006  . Essential hypertension 06/24/2006  . MENOPAUSAL SYNDROME 06/24/2006  . COLONIC POLYPS, HX OF 06/24/2006     Past Medical History:  Diagnosis Date  . Allergy    seasonal  . Chronic rhinitis 01/19/2007  . COLONIC POLYPS, HX OF 06/24/2006  . DIVERTICULOSIS, COLON 11/11/2006  . HYPERLIPIDEMIA 12/20/2008  . HYPERTENSION 06/24/2006  . Impaired glucose tolerance   . MENOPAUSAL SYNDROME 06/24/2006     Past Surgical History:  Procedure Laterality Date  . BREAST BIOPSY    . COLONOSCOPY    . POLYPECTOMY    . TUBAL LIGATION  1994   benign     Family History  Problem Relation Age of Onset  . Mental illness Mother   . Cancer Father        lung  . Parkinsonism Brother   . Colon cancer Neg Hx      Social History   Socioeconomic History  . Marital status: Married    Spouse name: Not on file  . Number of children: Not on  file  . Years of education: Not on file  . Highest education level: Not on file  Occupational History  . Not on file  Tobacco Use  . Smoking status: Never Smoker  . Smokeless tobacco: Never Used  Substance and Sexual Activity  . Alcohol use: No  . Drug use: No  . Sexual activity: Not on file  Other Topics Concern  . Not on file  Social History Narrative  . Not on file   Social Determinants of Health   Financial Resource Strain:   . Difficulty of Paying Living  Expenses: Not on file  Food Insecurity:   . Worried About Programme researcher, broadcasting/film/video in the Last Year: Not on file  . Ran Out of Food in the Last Year: Not on file  Transportation Needs:   . Lack of Transportation (Medical): Not on file  . Lack of Transportation (Non-Medical): Not on file  Physical Activity:   . Days of Exercise per Week: Not on file  . Minutes of Exercise per Session: Not on file  Stress:   . Feeling of Stress : Not on file  Social Connections:   . Frequency of Communication with Friends and Family: Not on file  . Frequency of Social Gatherings with Friends and Family: Not on file  . Attends Religious Services: Not on file  . Active Member of Clubs or Organizations: Not on file  . Attends Banker Meetings: Not on file  . Marital Status: Not on file  Intimate Partner Violence:   . Fear of Current or Ex-Partner: Not on file  . Emotionally Abused: Not on file  . Physically Abused: Not on file  . Sexually Abused: Not on file     No Known Allergies   Prior to Admission medications   Medication Sig Start Date End Date Taking? Authorizing Provider  amLODipine (NORVASC) 5 MG tablet Take 1 tablet by mouth once daily 02/02/19  Yes [facility], Eilleen Kempf, MD  dorzolamide-timolol (COSOPT) 22.3-6.8 MG/ML ophthalmic solution  02/25/17  Yes [provider]  fexofenadine (ALLEGRA ALLERGY) 180 MG tablet Take 180 mg by mouth daily.   Yes [provider]  latanoprost (XALATAN) 0.005 % ophthalmic solution Place 1 drop into both eyes at bedtime. 05/02/16  Yes [provider]  lisinopril-hydrochlorothiazide (ZESTORETIC) 20-12.5 MG tablet Take 1 tablet by mouth daily. 11/13/18  Yes Georgina Quint, MD     Depression screen [facility] 2/9 02/11/2019 04/27/2018 05/28/2017 10/04/2015 04/11/2014  Decreased Interest 0 0 0 0 0  Down, Depressed, Hopeless 0 0 0 0 0  PHQ - 2 Score 0 0 0 0 0     Fall Risk  02/11/2019 04/27/2018 05/28/2017 10/04/2015 04/11/2014  Falls in  the past year? 0 0 No No No  Number falls in past yr: 0 - - - -  Injury with Fall? 0 - - - -  Follow up Falls evaluation completed;Education provided Falls evaluation completed - - -      PHYSICAL EXAM: BP 130/78 Comment: taken from previous visit  Ht 5\' 1"  (1.549 m)   Wt 158 lb (71.7 kg)   BMI 29.85 kg/m    Wt Readings from Last 3 Encounters:  02/11/19 158 lb (71.7 kg)  10/02/17 158 lb 12.8 oz (72 kg)  06/14/17 158 lb (71.7 kg)    Medicare annual wellness visit, subsequent   Education/Counseling provided regarding diet and exercise, prevention of chronic diseases, smoking/tobacco cessation, if applicable, and reviewed "Covered Medicare Preventive Services."

## 2019-02-11 NOTE — Patient Instructions (Addendum)
Thank you for taking time to come for your Medicare Wellness Visit. I appreciate your ongoing commitment to your health goals. Please review the following plan we discussed and let me know if I can assist you in the future.  Leroy Kennedy LPN  Preventive Care 15 Years and Older, Female Preventive care refers to lifestyle choices and visits with your health care provider that can promote health and wellness. This includes:  A yearly physical exam. This is also called an annual well check.  Regular dental and eye exams.  Immunizations.  Screening for certain conditions.  Healthy lifestyle choices, such as diet and exercise. What can I expect for my preventive care visit? Physical exam Your health care provider will check:  Height and weight. These may be used to calculate body mass index (BMI), which is a measurement that tells if you are at a healthy weight.  Heart rate and blood pressure.  Your skin for abnormal spots. Counseling Your health care provider may ask you questions about:  Alcohol, tobacco, and drug use.  Emotional well-being.  Home and relationship well-being.  Sexual activity.  Eating habits.  History of falls.  Memory and ability to understand (cognition).  Work and work Statistician.  Pregnancy and menstrual history. What immunizations do I need?  Influenza (flu) vaccine  This is recommended every year. Tetanus, diphtheria, and pertussis (Tdap) vaccine  You may need a Td booster every 10 years. Varicella (chickenpox) vaccine  You may need this vaccine if you have not already been vaccinated. Zoster (shingles) vaccine  You may need this after age 32. Pneumococcal conjugate (PCV13) vaccine  One dose is recommended after age 22. Pneumococcal polysaccharide (PPSV23) vaccine  One dose is recommended after age 25. Measles, mumps, and rubella (MMR) vaccine  You may need at least one dose of MMR if you were born in 1957 or later. You may also  need a second dose. Meningococcal conjugate (MenACWY) vaccine  You may need this if you have certain conditions. Hepatitis A vaccine  You may need this if you have certain conditions or if you travel or work in places where you may be exposed to hepatitis A. Hepatitis B vaccine  You may need this if you have certain conditions or if you travel or work in places where you may be exposed to hepatitis B. Haemophilus influenzae type b (Hib) vaccine  You may need this if you have certain conditions. You may receive vaccines as individual doses or as more than one vaccine together in one shot (combination vaccines). Talk with your health care provider about the risks and benefits of combination vaccines. What tests do I need? Blood tests  Lipid and cholesterol levels. These may be checked every 5 years, or more frequently depending on your overall health.  Hepatitis C test.  Hepatitis B test. Screening  Lung cancer screening. You may have this screening every year starting at age 2 if you have a 30-pack-year history of smoking and currently smoke or have quit within the past 15 years.  Colorectal cancer screening. All adults should have this screening starting at age 59 and continuing until age 88. Your health care provider may recommend screening at age 35 if you are at increased risk. You will have tests every 1-10 years, depending on your results and the type of screening test.  Diabetes screening. This is done by checking your blood sugar (glucose) after you have not eaten for a while (fasting). You may have this done every 1-3  years.  Mammogram. This may be done every 1-2 years. Talk with your health care provider about how often you should have regular mammograms.  BRCA-related cancer screening. This may be done if you have a family history of breast, ovarian, tubal, or peritoneal cancers. Other tests  Sexually transmitted disease (STD) testing.  Bone density scan. This is done  to screen for osteoporosis. You may have this done starting at age 52. Follow these instructions at home: Eating and drinking  Eat a diet that includes fresh fruits and vegetables, whole grains, lean protein, and low-fat dairy products. Limit your intake of foods with high amounts of sugar, saturated fats, and salt.  Take vitamin and mineral supplements as recommended by your health care provider.  Do not drink alcohol if your health care provider tells you not to drink.  If you drink alcohol: ? Limit how much you have to 0-1 drink a day. ? Be aware of how much alcohol is in your drink. In the [place] one drink equals one 12 oz bottle of beer (355 mL), one 5 oz glass of wine (148 mL), or one 1 oz glass of hard liquor (44 mL). Lifestyle  Take daily care of your teeth and gums.  Stay active. Exercise for at least 30 minutes on 5 or more days each week.  Do not use any products that contain nicotine or tobacco, such as cigarettes, e-cigarettes, and chewing tobacco. If you need help quitting, ask your health care provider.  If you are sexually active, practice safe sex. Use a condom or other form of protection in order to prevent STIs (sexually transmitted infections).  Talk with your health care provider about taking a low-dose aspirin or statin. What's next?  Go to your health care provider once a year for a well check visit.  Ask your health care provider how often you should have your eyes and teeth checked.  Stay up to date on all vaccines. This information is not intended to replace advice given to you by your health care provider. Make sure you discuss any questions you have with your health care provider. Document Revised: 01/01/2018 Document Reviewed: 01/01/2018 Elsevier Patient Education  2020 [employer].

## 2019-02-16 ENCOUNTER — Encounter: Payer: Self-pay | Admitting: Emergency Medicine

## 2019-02-16 ENCOUNTER — Other Ambulatory Visit: Payer: Self-pay

## 2019-02-16 ENCOUNTER — Ambulatory Visit (INDEPENDENT_AMBULATORY_CARE_PROVIDER_SITE_OTHER): Payer: PPO | Admitting: Emergency Medicine

## 2019-02-16 VITALS — BP 156/82 | HR 69 | Temp 97.8°F | Wt 158.6 lb

## 2019-02-16 DIAGNOSIS — E785 Hyperlipidemia, unspecified: Secondary | ICD-10-CM

## 2019-02-16 DIAGNOSIS — I10 Essential (primary) hypertension: Secondary | ICD-10-CM

## 2019-02-16 DIAGNOSIS — R7303 Prediabetes: Secondary | ICD-10-CM

## 2019-02-16 MED ORDER — ROSUVASTATIN CALCIUM 10 MG PO TABS: 10.0000 mg | ORAL_TABLET | Freq: Every day | ORAL | 3 refills | Status: DC

## 2019-02-16 NOTE — Progress Notes (Signed)
The 10-year ASCVD risk score Denman George DC Montez Hageman., et al., 2013) is: 45.9%   Values used to calculate the score:     Age: 74 years     Sex: Female     Is Non-Hispanic African American: Yes     Diabetic: Yes     Tobacco smoker: No     Systolic Blood Pressure: 172 mmHg     Is BP treated: Yes     HDL Cholesterol: 47 mg/dL     Total Cholesterol: 218 mg/dL Brenda [name] 74 y.o.   Chief Complaint  Patient presents with  . Medical Management of Chronic Issues    f/u for HTN    HISTORY OF PRESENT ILLNESS: This is a 74 y.o. female but chronic medical problems here for follow-up: 1.  Hypertension: On amlodipine 5 mg daily and Zestoretic 20-12.5 mg daily.  Does not record blood pressures at home but does have a machine. 2.  Prediabetes 3.  Dyslipidemia: On no medications Has no complaints or medical concerns today.  HPI   Prior to Admission medications   Medication Sig Start Date End Date Taking? Authorizing Provider  amLODipine (NORVASC) 5 MG tablet Take 1 tablet by mouth once daily 02/02/19  Yes Aniket Paye, Eilleen Kempf, MD  dorzolamide-timolol (COSOPT) 22.3-6.8 MG/ML ophthalmic solution  02/25/17  Yes [provider]  fexofenadine (ALLEGRA ALLERGY) 180 MG tablet Take 180 mg by mouth daily.   Yes [provider]  latanoprost (XALATAN) 0.005 % ophthalmic solution Place 1 drop into both eyes at bedtime. 05/02/16  Yes [provider]  lisinopril-hydrochlorothiazide (ZESTORETIC) 20-12.5 MG tablet Take 1 tablet by mouth daily. 11/13/18  Yes SagardiaEilleen Kempf, MD    No Known Allergies  Patient Active Problem List   Diagnosis Date Noted  . Diabetes mellitus without complication (HCC) 10/11/2014  . Dyslipidemia 12/20/2008  . DIVERTICULOSIS, COLON 11/11/2006  . Essential hypertension 06/24/2006  . MENOPAUSAL SYNDROME 06/24/2006  . COLONIC POLYPS, HX OF 06/24/2006    Past Medical History:  Diagnosis Date  . Allergy    seasonal  . Chronic rhinitis 01/19/2007  .  COLONIC POLYPS, HX OF 06/24/2006  . DIVERTICULOSIS, COLON 11/11/2006  . HYPERLIPIDEMIA 12/20/2008  . HYPERTENSION 06/24/2006  . Impaired glucose tolerance   . MENOPAUSAL SYNDROME 06/24/2006    Past Surgical History:  Procedure Laterality Date  . BREAST BIOPSY    . COLONOSCOPY    . POLYPECTOMY    . TUBAL LIGATION  1994   benign    Social History   Socioeconomic History  . Marital status: Married    Spouse name: Not on file  . Number of children: Not on file  . Years of education: Not on file  . Highest education level: Not on file  Occupational History  . Not on file  Tobacco Use  . Smoking status: Never Smoker  . Smokeless tobacco: Never Used  Substance and Sexual Activity  . Alcohol use: No  . Drug use: No  . Sexual activity: Not on file  Other Topics Concern  . Not on file  Social History Narrative  . Not on file   Social Determinants of Health   Financial Resource Strain:   . Difficulty of Paying Living Expenses: Not on file  Food Insecurity:   . Worried About Programme researcher, broadcasting/film/video in the Last Year: Not on file  . Ran Out of Food in the Last Year: Not on file  Transportation Needs:   . Lack of Transportation (Medical):  Not on file  . Lack of Transportation (Non-Medical): Not on file  Physical Activity:   . Days of Exercise per Week: Not on file  . Minutes of Exercise per Session: Not on file  Stress:   . Feeling of Stress : Not on file  Social Connections:   . Frequency of Communication with Friends and Family: Not on file  . Frequency of Social Gatherings with Friends and Family: Not on file  . Attends Religious Services: Not on file  . Active Member of Clubs or Organizations: Not on file  . Attends Banker Meetings: Not on file  . Marital Status: Not on file  Intimate Partner Violence:   . Fear of Current or Ex-Partner: Not on file  . Emotionally Abused: Not on file  . Physically Abused: Not on file  . Sexually Abused: Not on file     Family History  Problem Relation Age of Onset  . Mental illness Mother   . Cancer Father        lung  . Parkinsonism Brother   . Colon cancer Neg Hx      Review of Systems  Constitutional: Negative.  Negative for chills and fever.  HENT: Negative.  Negative for congestion and sore throat.   Respiratory: Negative.  Negative for cough and shortness of breath.   Cardiovascular: Negative.  Negative for chest pain and palpitations.  Gastrointestinal: Negative.  Negative for abdominal pain, diarrhea, nausea and vomiting.  Genitourinary: Negative.  Negative for dysuria and hematuria.  Musculoskeletal: Positive for joint pain (Left ankle chronic pain from old fracture). Negative for myalgias.  Skin: Negative.  Negative for rash.  Neurological: Negative.  Negative for dizziness and headaches.  All other systems reviewed and are negative.  Today's Vitals   02/16/19 0806 02/16/19 0811  BP: (!) 172/83 (!) 156/82  Pulse: 69   Temp: 97.8 F (36.6 C)   TempSrc: Temporal   SpO2: 100%   Weight: 158 lb 9.6 oz (71.9 kg)    Body mass index is 29.97 kg/m.   Physical Exam Vitals reviewed.  Constitutional:      Appearance: Normal appearance.  HENT:     Head: Normocephalic.  Eyes:     Extraocular Movements: Extraocular movements intact.     Conjunctiva/sclera: Conjunctivae normal.     Pupils: Pupils are equal, round, and reactive to light.  Cardiovascular:     Rate and Rhythm: Normal rate and regular rhythm.     Heart sounds: Normal heart sounds.  Pulmonary:     Effort: Pulmonary effort is normal.     Breath sounds: Normal breath sounds.  Abdominal:     Palpations: Abdomen is soft.     Tenderness: There is no abdominal tenderness.  Musculoskeletal:        General: Normal range of motion.     Cervical back: Normal range of motion and neck supple.  Skin:    General: Skin is warm and dry.     Capillary Refill: Capillary refill takes less than 2 seconds.  Neurological:      General: No focal deficit present.     Mental Status: She is alert and oriented to person, place, and time.  Psychiatric:        Mood and Affect: Mood normal.        Behavior: Behavior normal.     A total of 30 min was spent with the patient, greater than 50% of which was in counseling/coordination of care regarding chronic medical  problems including hypertension prediabetes and dyslipidemia, management including diet and nutrition as well as medications, need for new statin medication, Crestor 10 mg daily, review of most recent office visit notes, review of most recent blood work, cardiovascular risks associated with chronic medical problems, prognosis, and need for follow-up.  ASSESSMENT & PLAN: Hypertension, essential Slightly elevated systolic blood pressure.  Continue present medications.  Dyslipidemia Previous lipid profiles reviewed with patient.  Needs to be started on a statin.  Will start Crestor 10 mg daily.  [place] was seen today for medical management of chronic issues.  Diagnoses and all orders for this visit:  Hypertension, essential -     CBC with Differential  Prediabetes -     HM Diabetes Foot Exam -     Hemoglobin A1c -     CBC with Differential  Dyslipidemia -     Lipid Panel -     rosuvastatin (CRESTOR) 10 MG tablet; Take 1 tablet (10 mg total) by mouth daily.    Patient Instructions       If you have lab work done today you will be contacted with your lab results within the next 2 weeks.  If you have not heard from [place] then please contact us. The fastest way to get your results is to register for My Chart.   IF you received an x-ray today, you will receive an invoice from [facility]. Please contact [facility] at [phone] with questions or concerns regarding your invoice.   IF you received labwork today, you will receive an invoice from [facility]. Please contact [facility] at [phone] with questions or concerns regarding your  invoice.   Our billing staff will not be able to assist you with questions regarding bills from these companies.  You will be contacted with the lab results as soon as they are available. The fastest way to get your results is to activate your My Chart account. Instructions are located on the last page of this paperwork. If you have not heard from [place] regarding the results in 2 weeks, please contact this office.      DASH Eating Plan DASH stands for "Dietary Approaches to Stop Hypertension." The DASH eating plan is a healthy eating plan that has been shown to reduce high blood pressure (hypertension). It may also reduce your risk for type 2 diabetes, heart disease, and stroke. The DASH eating plan may also help with weight loss. What are tips for following this plan?  General guidelines  Avoid eating more than 2,300 mg (milligrams) of salt (sodium) a day. If you have hypertension, you may need to reduce your sodium intake to 1,500 mg a day.  Limit alcohol intake to no more than 1 drink a day for nonpregnant women and 2 drinks a day for men. One drink equals 12 oz of beer, 5 oz of wine, or 1 oz of hard liquor.  Work with your health care provider to maintain a healthy body weight or to lose weight. Ask what an ideal weight is for you.  Get at least 30 minutes of exercise that causes your heart to beat faster (aerobic exercise) most days of the week. Activities may include walking, swimming, or biking.  Work with your health care provider or diet and nutrition specialist (dietitian) to adjust your eating plan to your individual calorie needs. Reading food labels   Check food labels for the amount of sodium per serving. Choose foods with less than 5 percent of the Daily Value of  sodium. Generally, foods with less than 300 mg of sodium per serving fit into this eating plan.  To find whole grains, look for the word "whole" as the first word in the ingredient list. Shopping  Buy products  labeled as "low-sodium" or "no salt added."  Buy fresh foods. Avoid canned foods and premade or frozen meals. Cooking  Avoid adding salt when cooking. Use salt-free seasonings or herbs instead of table salt or sea salt. Check with your health care provider or pharmacist before using salt substitutes.  Do not fry foods. Cook foods using healthy methods such as baking, boiling, grilling, and broiling instead.  Cook with heart-healthy oils, such as olive, canola, soybean, or sunflower oil. Meal planning  Eat a balanced diet that includes: ? 5 or more servings of fruits and vegetables each day. At each meal, try to fill half of your plate with fruits and vegetables. ? Up to 6-8 servings of whole grains each day. ? Less than 6 oz of lean meat, poultry, or fish each day. A 3-oz serving of meat is about the same size as a deck of cards. One egg equals 1 oz. ? 2 servings of low-fat dairy each day. ? A serving of nuts, seeds, or beans 5 times each week. ? Heart-healthy fats. Healthy fats called Omega-3 fatty acids are found in foods such as flaxseeds and coldwater fish, like sardines, salmon, and mackerel.  Limit how much you eat of the following: ? Canned or prepackaged foods. ? Food that is high in trans fat, such as fried foods. ? Food that is high in saturated fat, such as fatty meat. ? Sweets, desserts, sugary drinks, and other foods with added sugar. ? Full-fat dairy products.  Do not salt foods before eating.  Try to eat at least 2 vegetarian meals each week.  Eat more home-cooked food and less restaurant, buffet, and fast food.  When eating at a restaurant, ask that your food be prepared with less salt or no salt, if possible. What foods are recommended? The items listed may not be a complete list. Talk with your dietitian about what dietary choices are best for you. Grains Whole-grain or whole-wheat bread. Whole-grain or whole-wheat pasta. Brown rice. Orpah Cobbatmeal. Quinoa. Bulgur.  Whole-grain and low-sodium cereals. Pita bread. Low-fat, low-sodium crackers. Whole-wheat flour tortillas. Vegetables Fresh or frozen vegetables (raw, steamed, roasted, or grilled). Low-sodium or reduced-sodium tomato and vegetable juice. Low-sodium or reduced-sodium tomato sauce and tomato paste. Low-sodium or reduced-sodium canned vegetables. Fruits All fresh, dried, or frozen fruit. Canned fruit in natural juice (without added sugar). Meat and other protein foods Skinless chicken or [place]. Ground chicken or [place]. Pork with fat trimmed off. Fish and seafood. Egg whites. Dried beans, peas, or lentils. Unsalted nuts, nut butters, and seeds. Unsalted canned beans. Lean cuts of beef with fat trimmed off. Low-sodium, lean deli meat. Dairy Low-fat (1%) or fat-free (skim) milk. Fat-free, low-fat, or reduced-fat cheeses. Nonfat, low-sodium ricotta or cottage cheese. Low-fat or nonfat yogurt. Low-fat, low-sodium cheese. Fats and oils Soft margarine without trans fats. Vegetable oil. Low-fat, reduced-fat, or light mayonnaise and salad dressings (reduced-sodium). Canola, safflower, olive, soybean, and sunflower oils. Avocado. Seasoning and other foods Herbs. Spices. Seasoning mixes without salt. Unsalted popcorn and pretzels. Fat-free sweets. What foods are not recommended? The items listed may not be a complete list. Talk with your dietitian about what dietary choices are best for you. Grains Baked goods made with fat, such as croissants, muffins, or some breads. Dry pasta  or rice meal packs. Vegetables Creamed or fried vegetables. Vegetables in a cheese sauce. Regular canned vegetables (not low-sodium or reduced-sodium). Regular canned tomato sauce and paste (not low-sodium or reduced-sodium). Regular tomato and vegetable juice (not low-sodium or reduced-sodium). Rosita Fire. Olives. Fruits Canned fruit in a light or heavy syrup. Fried fruit. Fruit in cream or butter sauce. Meat and other protein  foods Fatty cuts of meat. Ribs. Fried meat. Tomasa Blase. Sausage. Bologna and other processed lunch meats. Salami. Fatback. Hotdogs. Bratwurst. Salted nuts and seeds. Canned beans with added salt. Canned or smoked fish. Whole eggs or egg yolks. Chicken or [place] with skin. Dairy Whole or 2% milk, cream, and half-and-half. Whole or full-fat cream cheese. Whole-fat or sweetened yogurt. Full-fat cheese. Nondairy creamers. Whipped toppings. Processed cheese and cheese spreads. Fats and oils Butter. Stick margarine. Lard. Shortening. Ghee. Bacon fat. Tropical oils, such as coconut, palm kernel, or palm oil. Seasoning and other foods Salted popcorn and pretzels. Onion salt, garlic salt, seasoned salt, table salt, and sea salt. Worcestershire sauce. Tartar sauce. Barbecue sauce. Teriyaki sauce. Soy sauce, including reduced-sodium. Steak sauce. Canned and packaged gravies. Fish sauce. Oyster sauce. Cocktail sauce. Horseradish that you find on the shelf. Ketchup. Mustard. Meat flavorings and tenderizers. Bouillon cubes. Hot sauce and Tabasco sauce. Premade or packaged marinades. Premade or packaged taco seasonings. Relishes. Regular salad dressings. Where to find more information:  National Heart, Lung, and Blood Institute: PopSteam.is  American Heart Association: www.heart.org Summary  The DASH eating plan is a healthy eating plan that has been shown to reduce high blood pressure (hypertension). It may also reduce your risk for type 2 diabetes, heart disease, and stroke.  With the DASH eating plan, you should limit salt (sodium) intake to 2,300 mg a day. If you have hypertension, you may need to reduce your sodium intake to 1,500 mg a day.  When on the DASH eating plan, aim to eat more fresh fruits and vegetables, whole grains, lean proteins, low-fat dairy, and heart-healthy fats.  Work with your health care provider or diet and nutrition specialist (dietitian) to adjust your eating plan to your  individual calorie needs. This information is not intended to replace advice given to you by your health care provider. Make sure you discuss any questions you have with your health care provider. Document Revised: 12/20/2016 Document Reviewed: 01/01/2016 Elsevier Patient Education  2020 [employer].  Hypertension, Adult High blood pressure (hypertension) is when the force of blood pumping through the arteries is too strong. The arteries are the blood vessels that carry blood from the heart throughout the body. Hypertension forces the heart to work harder to pump blood and may cause arteries to become narrow or stiff. Untreated or uncontrolled hypertension can cause a heart attack, heart failure, a stroke, kidney disease, and other problems. A blood pressure reading consists of a higher number over a lower number. Ideally, your blood pressure should be below 120/80. The first ("top") number is called the systolic pressure. It is a measure of the pressure in your arteries as your heart beats. The second ("bottom") number is called the diastolic pressure. It is a measure of the pressure in your arteries as the heart relaxes. What are the causes? The exact cause of this condition is not known. There are some conditions that result in or are related to high blood pressure. What increases the risk? Some risk factors for high blood pressure are under your control. The following factors may make you more likely to develop  this condition:  Smoking.  Having type 2 diabetes mellitus, high cholesterol, or both.  Not getting enough exercise or physical activity.  Being overweight.  Having too much fat, sugar, calories, or salt (sodium) in your diet.  Drinking too much alcohol. Some risk factors for high blood pressure may be difficult or impossible to change. Some of these factors include:  Having chronic kidney disease.  Having a family history of high blood pressure.  Age. Risk increases with  age.  Race. You may be at higher risk if you are African American.  Gender. Men are at higher risk than women before age 74. After age 74, women are at higher risk than men.  Having obstructive sleep apnea.  Stress. What are the signs or symptoms? High blood pressure may not cause symptoms. Very high blood pressure (hypertensive crisis) may cause:  Headache.  Anxiety.  Shortness of breath.  Nosebleed.  Nausea and vomiting.  Vision changes.  Severe chest pain.  Seizures. How is this diagnosed? This condition is diagnosed by measuring your blood pressure while you are seated, with your arm resting on a flat surface, your legs uncrossed, and your feet flat on the floor. The cuff of the blood pressure monitor will be placed directly against the skin of your upper arm at the level of your heart. It should be measured at least twice using the same arm. Certain conditions can cause a difference in blood pressure between your right and left arms. Certain factors can cause blood pressure readings to be lower or higher than normal for a short period of time:  When your blood pressure is higher when you are in a health care provider's office than when you are at home, this is called white coat hypertension. Most people with this condition do not need medicines.  When your blood pressure is higher at home than when you are in a health care provider's office, this is called masked hypertension. Most people with this condition may need medicines to control blood pressure. If you have a high blood pressure reading during one visit or you have normal blood pressure with other risk factors, you may be asked to:  Return on a different day to have your blood pressure checked again.  Monitor your blood pressure at home for 1 week or longer. If you are diagnosed with hypertension, you may have other blood or imaging tests to help your health care provider understand your overall risk for other  conditions. How is this treated? This condition is treated by making healthy lifestyle changes, such as eating healthy foods, exercising more, and reducing your alcohol intake. Your health care provider may prescribe medicine if lifestyle changes are not enough to get your blood pressure under control, and if:  Your systolic blood pressure is above 130.  Your diastolic blood pressure is above 80. Your personal target blood pressure may vary depending on your medical conditions, your age, and other factors. Follow these instructions at home: Eating and drinking   Eat a diet that is high in fiber and potassium, and low in sodium, added sugar, and fat. An example eating plan is called the DASH (Dietary Approaches to Stop Hypertension) diet. To eat this way: ? Eat plenty of fresh fruits and vegetables. Try to fill one half of your plate at each meal with fruits and vegetables. ? Eat whole grains, such as whole-wheat pasta, brown rice, or whole-grain bread. Fill about one fourth of your plate with whole grains. ? Eat  or drink low-fat dairy products, such as skim milk or low-fat yogurt. ? Avoid fatty cuts of meat, processed or cured meats, and poultry with skin. Fill about one fourth of your plate with lean proteins, such as fish, chicken without skin, beans, eggs, or tofu. ? Avoid pre-made and processed foods. These tend to be higher in sodium, added sugar, and fat.  Reduce your daily sodium intake. Most people with hypertension should eat less than 1,500 mg of sodium a day.  Do not drink alcohol if: ? Your health care provider tells you not to drink. ? You are pregnant, may be pregnant, or are planning to become pregnant.  If you drink alcohol: ? Limit how much you use to:  0-1 drink a day for women.  0-2 drinks a day for men. ? Be aware of how much alcohol is in your drink. In the [place] one drink equals one 12 oz bottle of beer (355 mL), one 5 oz glass of wine (148 mL), or one 1 oz glass  of hard liquor (44 mL). Lifestyle   Work with your health care provider to maintain a healthy body weight or to lose weight. Ask what an ideal weight is for you.  Get at least 30 minutes of exercise most days of the week. Activities may include walking, swimming, or biking.  Include exercise to strengthen your muscles (resistance exercise), such as Pilates or lifting weights, as part of your weekly exercise routine. Try to do these types of exercises for 30 minutes at least 3 days a week.  Do not use any products that contain nicotine or tobacco, such as cigarettes, e-cigarettes, and chewing tobacco. If you need help quitting, ask your health care provider.  Monitor your blood pressure at home as told by your health care provider.  Keep all follow-up visits as told by your health care provider. This is important. Medicines  Take over-the-counter and prescription medicines only as told by your health care provider. Follow directions carefully. Blood pressure medicines must be taken as prescribed.  Do not skip doses of blood pressure medicine. Doing this puts you at risk for problems and can make the medicine less effective.  Ask your health care provider about side effects or reactions to medicines that you should watch for. Contact a health care provider if you:  Think you are having a reaction to a medicine you are taking.  Have headaches that keep coming back (recurring).  Feel dizzy.  Have swelling in your ankles.  Have trouble with your vision. Get help right away if you:  Develop a severe headache or confusion.  Have unusual weakness or numbness.  Feel faint.  Have severe pain in your chest or abdomen.  Vomit repeatedly.  Have trouble breathing. Summary  Hypertension is when the force of blood pumping through your arteries is too strong. If this condition is not controlled, it may put you at risk for serious complications.  Your personal target blood pressure  may vary depending on your medical conditions, your age, and other factors. For most people, a normal blood pressure is less than 120/80.  Hypertension is treated with lifestyle changes, medicines, or a combination of both. Lifestyle changes include losing weight, eating a healthy, low-sodium diet, exercising more, and limiting alcohol. This information is not intended to replace advice given to you by your health care provider. Make sure you discuss any questions you have with your health care provider. Document Revised: 09/17/2017 Document Reviewed: 09/17/2017 Elsevier Patient Education  [place], MD [facility]

## 2019-02-16 NOTE — Patient Instructions (Addendum)
   If you have lab work done today you will be contacted with your lab results within the next 2 weeks.  If you have not heard from us then please contact us. The fastest way to get your results is to register for My Chart.   IF you received an x-ray today, you will receive an invoice from [facility]. Please contact [facility] at [phone] with questions or concerns regarding your invoice.   IF you received labwork today, you will receive an invoice from [facility]. Please contact [facility] at [phone] with questions or concerns regarding your invoice.   Our billing staff will not be able to assist you with questions regarding bills from these companies.  You will be contacted with the lab results as soon as they are available. The fastest way to get your results is to activate your My Chart account. Instructions are located on the last page of this paperwork. If you have not heard from us regarding the results in 2 weeks, please contact this office.      DASH Eating Plan DASH stands for "Dietary Approaches to Stop Hypertension." The DASH eating plan is a healthy eating plan that has been shown to reduce high blood pressure (hypertension). It may also reduce your risk for type 2 diabetes, heart disease, and stroke. The DASH eating plan may also help with weight loss. What are tips for following this plan?  General guidelines  Avoid eating more than 2,300 mg (milligrams) of salt (sodium) a day. If you have hypertension, you may need to reduce your sodium intake to 1,500 mg a day.  Limit alcohol intake to no more than 1 drink a day for nonpregnant women and 2 drinks a day for men. One drink equals 12 oz of beer, 5 oz of wine, or 1 oz of hard liquor.  Work with your health care provider to maintain a healthy body weight or to lose weight. Ask what an ideal weight is for you.  Get at least 30 minutes of exercise that causes your heart to beat faster (aerobic  exercise) most days of the week. Activities may include walking, swimming, or biking.  Work with your health care provider or diet and nutrition specialist (dietitian) to adjust your eating plan to your individual calorie needs. Reading food labels   Check food labels for the amount of sodium per serving. Choose foods with less than 5 percent of the Daily Value of sodium. Generally, foods with less than 300 mg of sodium per serving fit into this eating plan.  To find whole grains, look for the word "whole" as the first word in the ingredient list. Shopping  Buy products labeled as "low-sodium" or "no salt added."  Buy fresh foods. Avoid canned foods and premade or frozen meals. Cooking  Avoid adding salt when cooking. Use salt-free seasonings or herbs instead of table salt or sea salt. Check with your health care provider or pharmacist before using salt substitutes.  Do not fry foods. Cook foods using healthy methods such as baking, boiling, grilling, and broiling instead.  Cook with heart-healthy oils, such as olive, canola, soybean, or sunflower oil. Meal planning  Eat a balanced diet that includes: ? 5 or more servings of fruits and vegetables each day. At each meal, try to fill half of your plate with fruits and vegetables. ? Up to 6-8 servings of whole grains each day. ? Less than 6 oz of lean meat, poultry, or fish each day. A 3-oz   serving of meat is about the same size as a deck of cards. One egg equals 1 oz. ? 2 servings of low-fat dairy each day. ? A serving of nuts, seeds, or beans 5 times each week. ? Heart-healthy fats. Healthy fats called Omega-3 fatty acids are found in foods such as flaxseeds and coldwater fish, like sardines, salmon, and mackerel.  Limit how much you eat of the following: ? Canned or prepackaged foods. ? Food that is high in trans fat, such as fried foods. ? Food that is high in saturated fat, such as fatty meat. ? Sweets, desserts, sugary drinks,  and other foods with added sugar. ? Full-fat dairy products.  Do not salt foods before eating.  Try to eat at least 2 vegetarian meals each week.  Eat more home-cooked food and less restaurant, buffet, and fast food.  When eating at a restaurant, ask that your food be prepared with less salt or no salt, if possible. What foods are recommended? The items listed may not be a complete list. Talk with your dietitian about what dietary choices are best for you. Grains Whole-grain or whole-wheat bread. Whole-grain or whole-wheat pasta. Brown rice. Oatmeal. Quinoa. Bulgur. Whole-grain and low-sodium cereals. Pita bread. Low-fat, low-sodium crackers. Whole-wheat flour tortillas. Vegetables Fresh or frozen vegetables (raw, steamed, roasted, or grilled). Low-sodium or reduced-sodium tomato and vegetable juice. Low-sodium or reduced-sodium tomato sauce and tomato paste. Low-sodium or reduced-sodium canned vegetables. Fruits All fresh, dried, or frozen fruit. Canned fruit in natural juice (without added sugar). Meat and other protein foods Skinless chicken or turkey. Ground chicken or turkey. Pork with fat trimmed off. Fish and seafood. Egg whites. Dried beans, peas, or lentils. Unsalted nuts, nut butters, and seeds. Unsalted canned beans. Lean cuts of beef with fat trimmed off. Low-sodium, lean deli meat. Dairy Low-fat (1%) or fat-free (skim) milk. Fat-free, low-fat, or reduced-fat cheeses. Nonfat, low-sodium ricotta or cottage cheese. Low-fat or nonfat yogurt. Low-fat, low-sodium cheese. Fats and oils Soft margarine without trans fats. Vegetable oil. Low-fat, reduced-fat, or light mayonnaise and salad dressings (reduced-sodium). Canola, safflower, olive, soybean, and sunflower oils. Avocado. Seasoning and other foods Herbs. Spices. Seasoning mixes without salt. Unsalted popcorn and pretzels. Fat-free sweets. What foods are not recommended? The items listed may not be a complete list. Talk with your  dietitian about what dietary choices are best for you. Grains Baked goods made with fat, such as croissants, muffins, or some breads. Dry pasta or rice meal packs. Vegetables Creamed or fried vegetables. Vegetables in a cheese sauce. Regular canned vegetables (not low-sodium or reduced-sodium). Regular canned tomato sauce and paste (not low-sodium or reduced-sodium). Regular tomato and vegetable juice (not low-sodium or reduced-sodium). Pickles. Olives. Fruits Canned fruit in a light or heavy syrup. Fried fruit. Fruit in cream or butter sauce. Meat and other protein foods Fatty cuts of meat. Ribs. Fried meat. Bacon. Sausage. Bologna and other processed lunch meats. Salami. Fatback. Hotdogs. Bratwurst. Salted nuts and seeds. Canned beans with added salt. Canned or smoked fish. Whole eggs or egg yolks. Chicken or turkey with skin. Dairy Whole or 2% milk, cream, and half-and-half. Whole or full-fat cream cheese. Whole-fat or sweetened yogurt. Full-fat cheese. Nondairy creamers. Whipped toppings. Processed cheese and cheese spreads. Fats and oils Butter. Stick margarine. Lard. Shortening. Ghee. Bacon fat. Tropical oils, such as coconut, palm kernel, or palm oil. Seasoning and other foods Salted popcorn and pretzels. Onion salt, garlic salt, seasoned salt, table salt, and sea salt. Worcestershire sauce. Tartar sauce. Barbecue   sauce. Teriyaki sauce. Soy sauce, including reduced-sodium. Steak sauce. Canned and packaged gravies. Fish sauce. Oyster sauce. Cocktail sauce. Horseradish that you find on the shelf. Ketchup. Mustard. Meat flavorings and tenderizers. Bouillon cubes. Hot sauce and Tabasco sauce. Premade or packaged marinades. Premade or packaged taco seasonings. Relishes. Regular salad dressings. Where to find more information:  National Heart, Lung, and Blood Institute: www.nhlbi.nih.gov  American Heart Association: www.heart.org Summary  The DASH eating plan is a healthy eating plan that has  been shown to reduce high blood pressure (hypertension). It may also reduce your risk for type 2 diabetes, heart disease, and stroke.  With the DASH eating plan, you should limit salt (sodium) intake to 2,300 mg a day. If you have hypertension, you may need to reduce your sodium intake to 1,500 mg a day.  When on the DASH eating plan, aim to eat more fresh fruits and vegetables, whole grains, lean proteins, low-fat dairy, and heart-healthy fats.  Work with your health care provider or diet and nutrition specialist (dietitian) to adjust your eating plan to your individual calorie needs. This information is not intended to replace advice given to you by your health care provider. Make sure you discuss any questions you have with your health care provider. Document Revised: 12/20/2016 Document Reviewed: 01/01/2016 Elsevier Patient Education  2020 Elsevier Inc.  Hypertension, Adult High blood pressure (hypertension) is when the force of blood pumping through the arteries is too strong. The arteries are the blood vessels that carry blood from the heart throughout the body. Hypertension forces the heart to work harder to pump blood and may cause arteries to become narrow or stiff. Untreated or uncontrolled hypertension can cause a heart attack, heart failure, a stroke, kidney disease, and other problems. A blood pressure reading consists of a higher number over a lower number. Ideally, your blood pressure should be below 120/80. The first ("top") number is called the systolic pressure. It is a measure of the pressure in your arteries as your heart beats. The second ("bottom") number is called the diastolic pressure. It is a measure of the pressure in your arteries as the heart relaxes. What are the causes? The exact cause of this condition is not known. There are some conditions that result in or are related to high blood pressure. What increases the risk? Some risk factors for high blood pressure are  under your control. The following factors may make you more likely to develop this condition:  Smoking.  Having type 2 diabetes mellitus, high cholesterol, or both.  Not getting enough exercise or physical activity.  Being overweight.  Having too much fat, sugar, calories, or salt (sodium) in your diet.  Drinking too much alcohol. Some risk factors for high blood pressure may be difficult or impossible to change. Some of these factors include:  Having chronic kidney disease.  Having a family history of high blood pressure.  Age. Risk increases with age.  Race. You may be at higher risk if you are African American.  Gender. Men are at higher risk than women before age 45. After age 65, women are at higher risk than men.  Having obstructive sleep apnea.  Stress. What are the signs or symptoms? High blood pressure may not cause symptoms. Very high blood pressure (hypertensive crisis) may cause:  Headache.  Anxiety.  Shortness of breath.  Nosebleed.  Nausea and vomiting.  Vision changes.  Severe chest pain.  Seizures. How is this diagnosed? This condition is diagnosed by measuring your   blood pressure while you are seated, with your arm resting on a flat surface, your legs uncrossed, and your feet flat on the floor. The cuff of the blood pressure monitor will be placed directly against the skin of your upper arm at the level of your heart. It should be measured at least twice using the same arm. Certain conditions can cause a difference in blood pressure between your right and left arms. Certain factors can cause blood pressure readings to be lower or higher than normal for a short period of time:  When your blood pressure is higher when you are in a health care provider's office than when you are at home, this is called white coat hypertension. Most people with this condition do not need medicines.  When your blood pressure is higher at home than when you are in a  health care provider's office, this is called masked hypertension. Most people with this condition may need medicines to control blood pressure. If you have a high blood pressure reading during one visit or you have normal blood pressure with other risk factors, you may be asked to:  Return on a different day to have your blood pressure checked again.  Monitor your blood pressure at home for 1 week or longer. If you are diagnosed with hypertension, you may have other blood or imaging tests to help your health care provider understand your overall risk for other conditions. How is this treated? This condition is treated by making healthy lifestyle changes, such as eating healthy foods, exercising more, and reducing your alcohol intake. Your health care provider may prescribe medicine if lifestyle changes are not enough to get your blood pressure under control, and if:  Your systolic blood pressure is above 130.  Your diastolic blood pressure is above 80. Your personal target blood pressure may vary depending on your medical conditions, your age, and other factors. Follow these instructions at home: Eating and drinking   Eat a diet that is high in fiber and potassium, and low in sodium, added sugar, and fat. An example eating plan is called the DASH (Dietary Approaches to Stop Hypertension) diet. To eat this way: ? Eat plenty of fresh fruits and vegetables. Try to fill one half of your plate at each meal with fruits and vegetables. ? Eat whole grains, such as whole-wheat pasta, brown rice, or whole-grain bread. Fill about one fourth of your plate with whole grains. ? Eat or drink low-fat dairy products, such as skim milk or low-fat yogurt. ? Avoid fatty cuts of meat, processed or cured meats, and poultry with skin. Fill about one fourth of your plate with lean proteins, such as fish, chicken without skin, beans, eggs, or tofu. ? Avoid pre-made and processed foods. These tend to be higher in  sodium, added sugar, and fat.  Reduce your daily sodium intake. Most people with hypertension should eat less than 1,500 mg of sodium a day.  Do not drink alcohol if: ? Your health care provider tells you not to drink. ? You are pregnant, may be pregnant, or are planning to become pregnant.  If you drink alcohol: ? Limit how much you use to:  0-1 drink a day for women.  0-2 drinks a day for men. ? Be aware of how much alcohol is in your drink. In the [place] one drink equals one 12 oz bottle of beer (355 mL), one 5 oz glass of wine (148 mL), or one 1 oz glass of hard liquor (  44 mL). Lifestyle   Work with your health care provider to maintain a healthy body weight or to lose weight. Ask what an ideal weight is for you.  Get at least 30 minutes of exercise most days of the week. Activities may include walking, swimming, or biking.  Include exercise to strengthen your muscles (resistance exercise), such as Pilates or lifting weights, as part of your weekly exercise routine. Try to do these types of exercises for 30 minutes at least 3 days a week.  Do not use any products that contain nicotine or tobacco, such as cigarettes, e-cigarettes, and chewing tobacco. If you need help quitting, ask your health care provider.  Monitor your blood pressure at home as told by your health care provider.  Keep all follow-up visits as told by your health care provider. This is important. Medicines  Take over-the-counter and prescription medicines only as told by your health care provider. Follow directions carefully. Blood pressure medicines must be taken as prescribed.  Do not skip doses of blood pressure medicine. Doing this puts you at risk for problems and can make the medicine less effective.  Ask your health care provider about side effects or reactions to medicines that you should watch for. Contact a health care provider if you:  Think you are having a reaction to a medicine you are  taking.  Have headaches that keep coming back (recurring).  Feel dizzy.  Have swelling in your ankles.  Have trouble with your vision. Get help right away if you:  Develop a severe headache or confusion.  Have unusual weakness or numbness.  Feel faint.  Have severe pain in your chest or abdomen.  Vomit repeatedly.  Have trouble breathing. Summary  Hypertension is when the force of blood pumping through your arteries is too strong. If this condition is not controlled, it may put you at risk for serious complications.  Your personal target blood pressure may vary depending on your medical conditions, your age, and other factors. For most people, a normal blood pressure is less than 120/80.  Hypertension is treated with lifestyle changes, medicines, or a combination of both. Lifestyle changes include losing weight, eating a healthy, low-sodium diet, exercising more, and limiting alcohol. This information is not intended to replace advice given to you by your health care provider. Make sure you discuss any questions you have with your health care provider. Document Revised: 09/17/2017 Document Reviewed: 09/17/2017 Elsevier Patient Education  2020 Elsevier Inc.  

## 2019-02-16 NOTE — Assessment & Plan Note (Signed)
Previous lipid profiles reviewed with patient.  Needs to be started on a statin.  Will start Crestor 10 mg daily.

## 2019-02-16 NOTE — Assessment & Plan Note (Signed)
Slightly elevated systolic blood pressure.  Continue present medications.

## 2019-02-17 ENCOUNTER — Encounter: Payer: Self-pay | Admitting: Emergency Medicine

## 2019-02-17 LAB — LIPID PANEL
Chol/HDL Ratio: 5.3 ratio — ABNORMAL HIGH (ref 0.0–4.4)
Cholesterol, Total: 234 mg/dL — ABNORMAL HIGH (ref 100–199)
HDL: 44 mg/dL (ref >39)
LDL Chol Calc (NIH): 166 mg/dL — ABNORMAL HIGH (ref 0–99)
Triglycerides: 132 mg/dL (ref 0–149)
VLDL Cholesterol Cal: 24 mg/dL (ref 5–40)

## 2019-02-17 LAB — CBC WITH DIFFERENTIAL/PLATELET
Basophils Absolute: 0 10*3/uL (ref 0.0–0.2)
Basos: 0 %
EOS (ABSOLUTE): 0.2 10*3/uL (ref 0.0–0.4)
Eos: 3 %
Hematocrit: 46.7 % — ABNORMAL HIGH (ref 34.0–46.6)
Hemoglobin: 15.8 g/dL (ref 11.1–15.9)
Immature Grans (Abs): 0 10*3/uL (ref 0.0–0.1)
Immature Granulocytes: 0 %
Lymphocytes Absolute: 2 10*3/uL (ref 0.7–3.1)
Lymphs: 38 %
MCH: 30.9 pg (ref 26.6–33.0)
MCHC: 33.8 g/dL (ref 31.5–35.7)
MCV: 91 fL (ref 79–97)
Monocytes Absolute: 0.5 10*3/uL (ref 0.1–0.9)
Monocytes: 10 %
Neutrophils Absolute: 2.5 10*3/uL (ref 1.4–7.0)
Neutrophils: 49 %
Platelets: 268 10*3/uL (ref 150–450)
RBC: 5.12 x10E6/uL (ref 3.77–5.28)
RDW: 12.9 % (ref 11.7–15.4)
WBC: 5.2 10*3/uL (ref 3.4–10.8)

## 2019-02-17 LAB — HEMOGLOBIN A1C
Est. average glucose Bld gHb Est-mCnc: 134 mg/dL
Hgb A1c MFr Bld: 6.3 % — ABNORMAL HIGH (ref 4.8–5.6)

## 2019-02-17 NOTE — Progress Notes (Signed)
Please add CMP if possible. Thanks.

## 2019-02-18 DIAGNOSIS — I10 Essential (primary) hypertension: Secondary | ICD-10-CM | POA: Diagnosis not present

## 2019-02-18 NOTE — Addendum Note (Signed)
Addended by: Argentina Ponder on: 02/18/2019 02:54 PM   Modules accepted: Orders

## 2019-02-19 ENCOUNTER — Other Ambulatory Visit: Payer: Self-pay | Admitting: Emergency Medicine

## 2019-02-19 ENCOUNTER — Encounter: Payer: Self-pay | Admitting: Emergency Medicine

## 2019-02-19 ENCOUNTER — Ambulatory Visit (INDEPENDENT_AMBULATORY_CARE_PROVIDER_SITE_OTHER): Payer: PPO | Admitting: Family Medicine

## 2019-02-19 ENCOUNTER — Other Ambulatory Visit: Payer: Self-pay

## 2019-02-19 DIAGNOSIS — E875 Hyperkalemia: Secondary | ICD-10-CM

## 2019-02-19 LAB — CMP14+EGFR
ALT: 9 IU/L (ref 0–32)
AST: 17 IU/L (ref 0–40)
Albumin/Globulin Ratio: 1.9 (ref 1.2–2.2)
Albumin: 4.7 g/dL (ref 3.7–4.7)
Alkaline Phosphatase: 76 IU/L (ref 39–117)
BUN/Creatinine Ratio: 13 (ref 12–28)
BUN: 12 mg/dL (ref 8–27)
Bilirubin Total: 0.7 mg/dL (ref 0.0–1.2)
CO2: 26 mmol/L (ref 20–29)
Calcium: 9.9 mg/dL (ref 8.7–10.3)
Chloride: 100 mmol/L (ref 96–106)
Creatinine, Ser: 0.89 mg/dL (ref 0.57–1.00)
GFR calc Af Amer: 74 mL/min/{1.73_m2} (ref >59)
GFR calc non Af Amer: 65 mL/min/{1.73_m2} (ref >59)
Globulin, Total: 2.5 g/dL (ref 1.5–4.5)
Glucose: 131 mg/dL — ABNORMAL HIGH (ref 65–99)
Potassium: 7.1 mmol/L (ref 3.5–5.2)
Sodium: 139 mmol/L (ref 134–144)
Total Protein: 7.2 g/dL (ref 6.0–8.5)

## 2019-02-19 NOTE — Progress Notes (Signed)
Call patient now. Needs to have BMP repeated today. High potassium likely a lab error.Thanks.

## 2019-02-20 LAB — BASIC METABOLIC PANEL
BUN/Creatinine Ratio: 11 — ABNORMAL LOW (ref 12–28)
BUN: 9 mg/dL (ref 8–27)
CO2: 28 mmol/L (ref 20–29)
Calcium: 10.1 mg/dL (ref 8.7–10.3)
Chloride: 100 mmol/L (ref 96–106)
Creatinine, Ser: 0.83 mg/dL (ref 0.57–1.00)
GFR calc Af Amer: 81 mL/min/{1.73_m2} (ref >59)
GFR calc non Af Amer: 70 mL/min/{1.73_m2} (ref >59)
Glucose: 117 mg/dL — ABNORMAL HIGH (ref 65–99)
Potassium: 3.4 mmol/L — ABNORMAL LOW (ref 3.5–5.2)
Sodium: 141 mmol/L (ref 134–144)

## 2019-03-14 IMAGING — CR DG CHEST 2V
2 series · 2 of 2 positions shown · non-contrast
Comparison: None.

CLINICAL DATA: Hypertensive, lightheaded

EXAM:
CHEST  2 VIEW

[w chest pa]
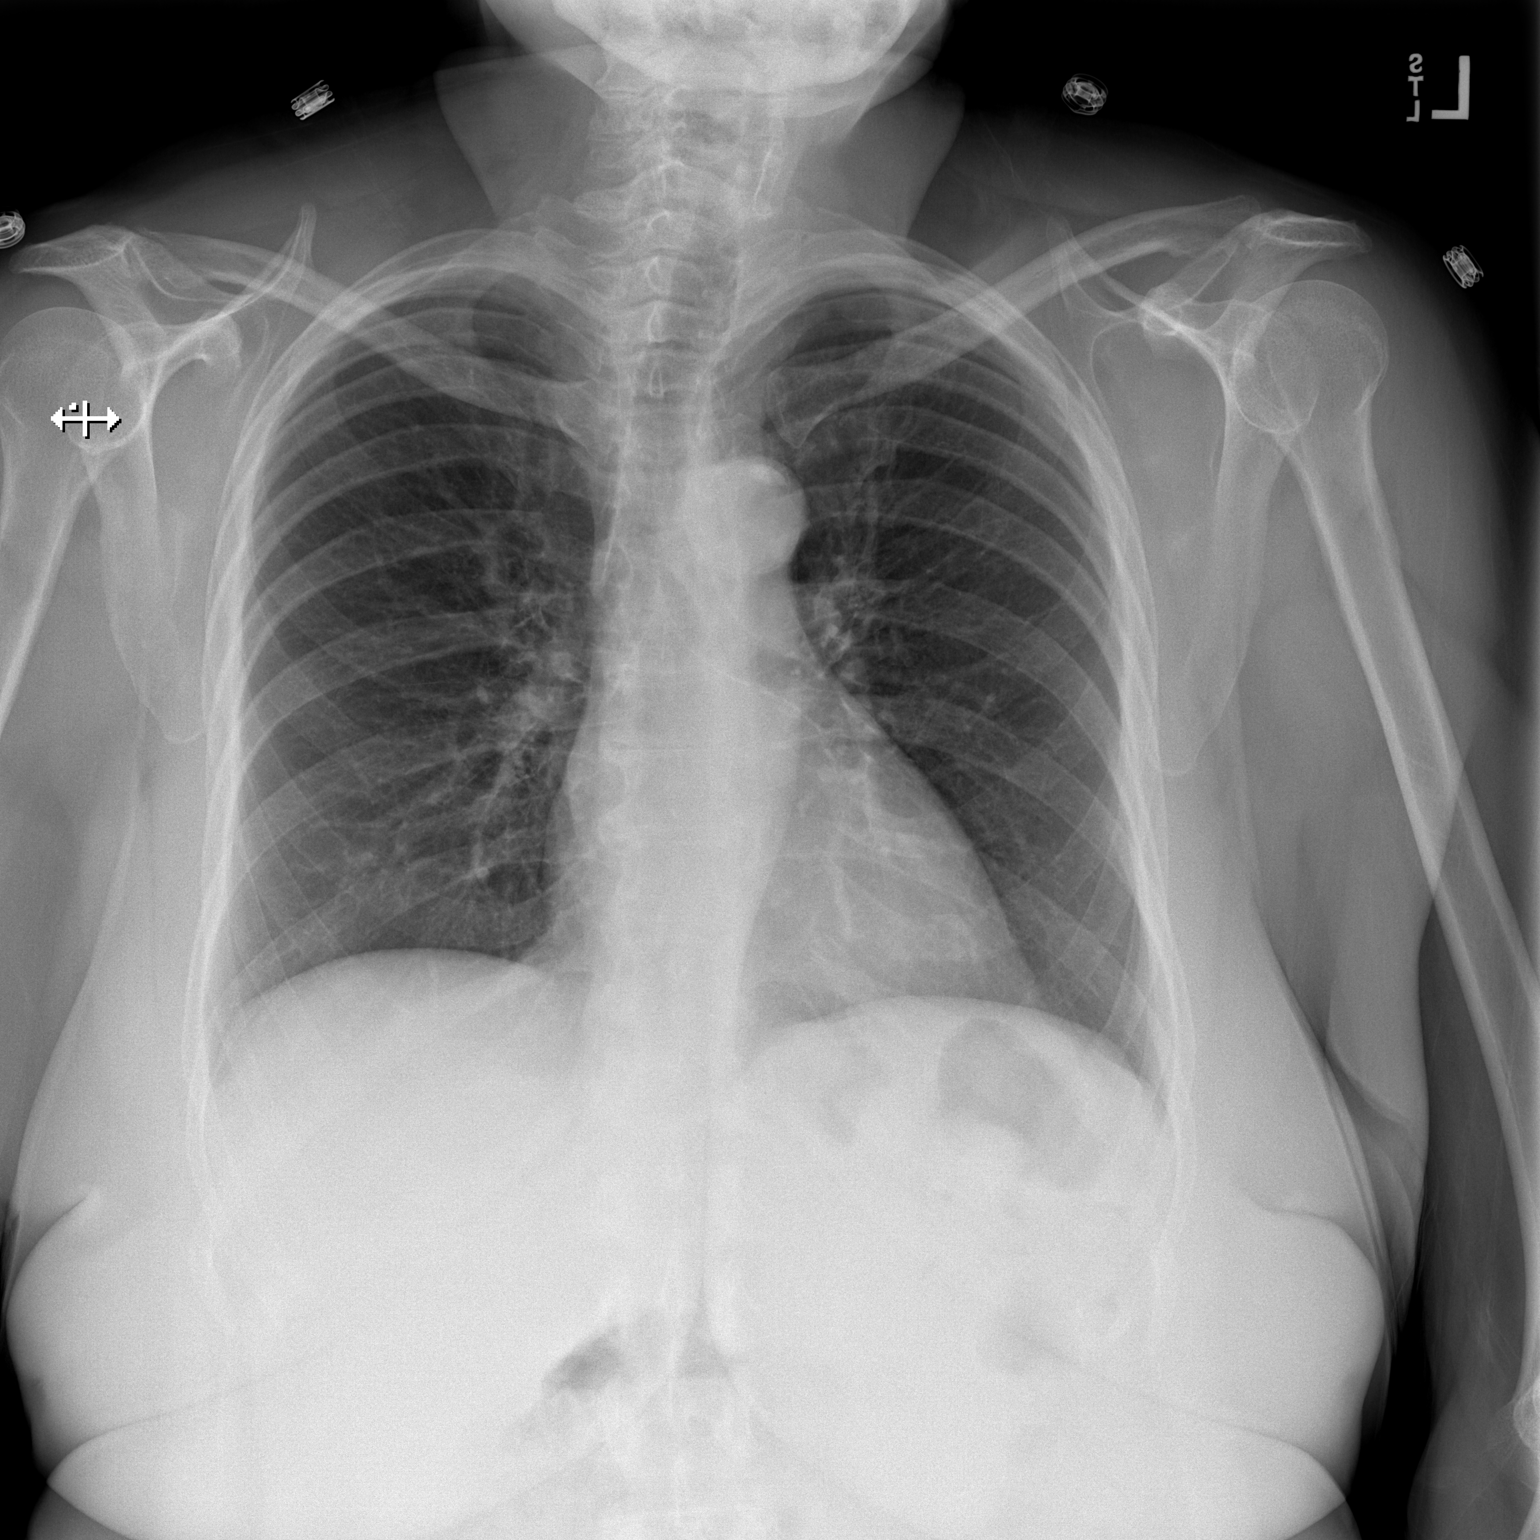

[w chest lat]
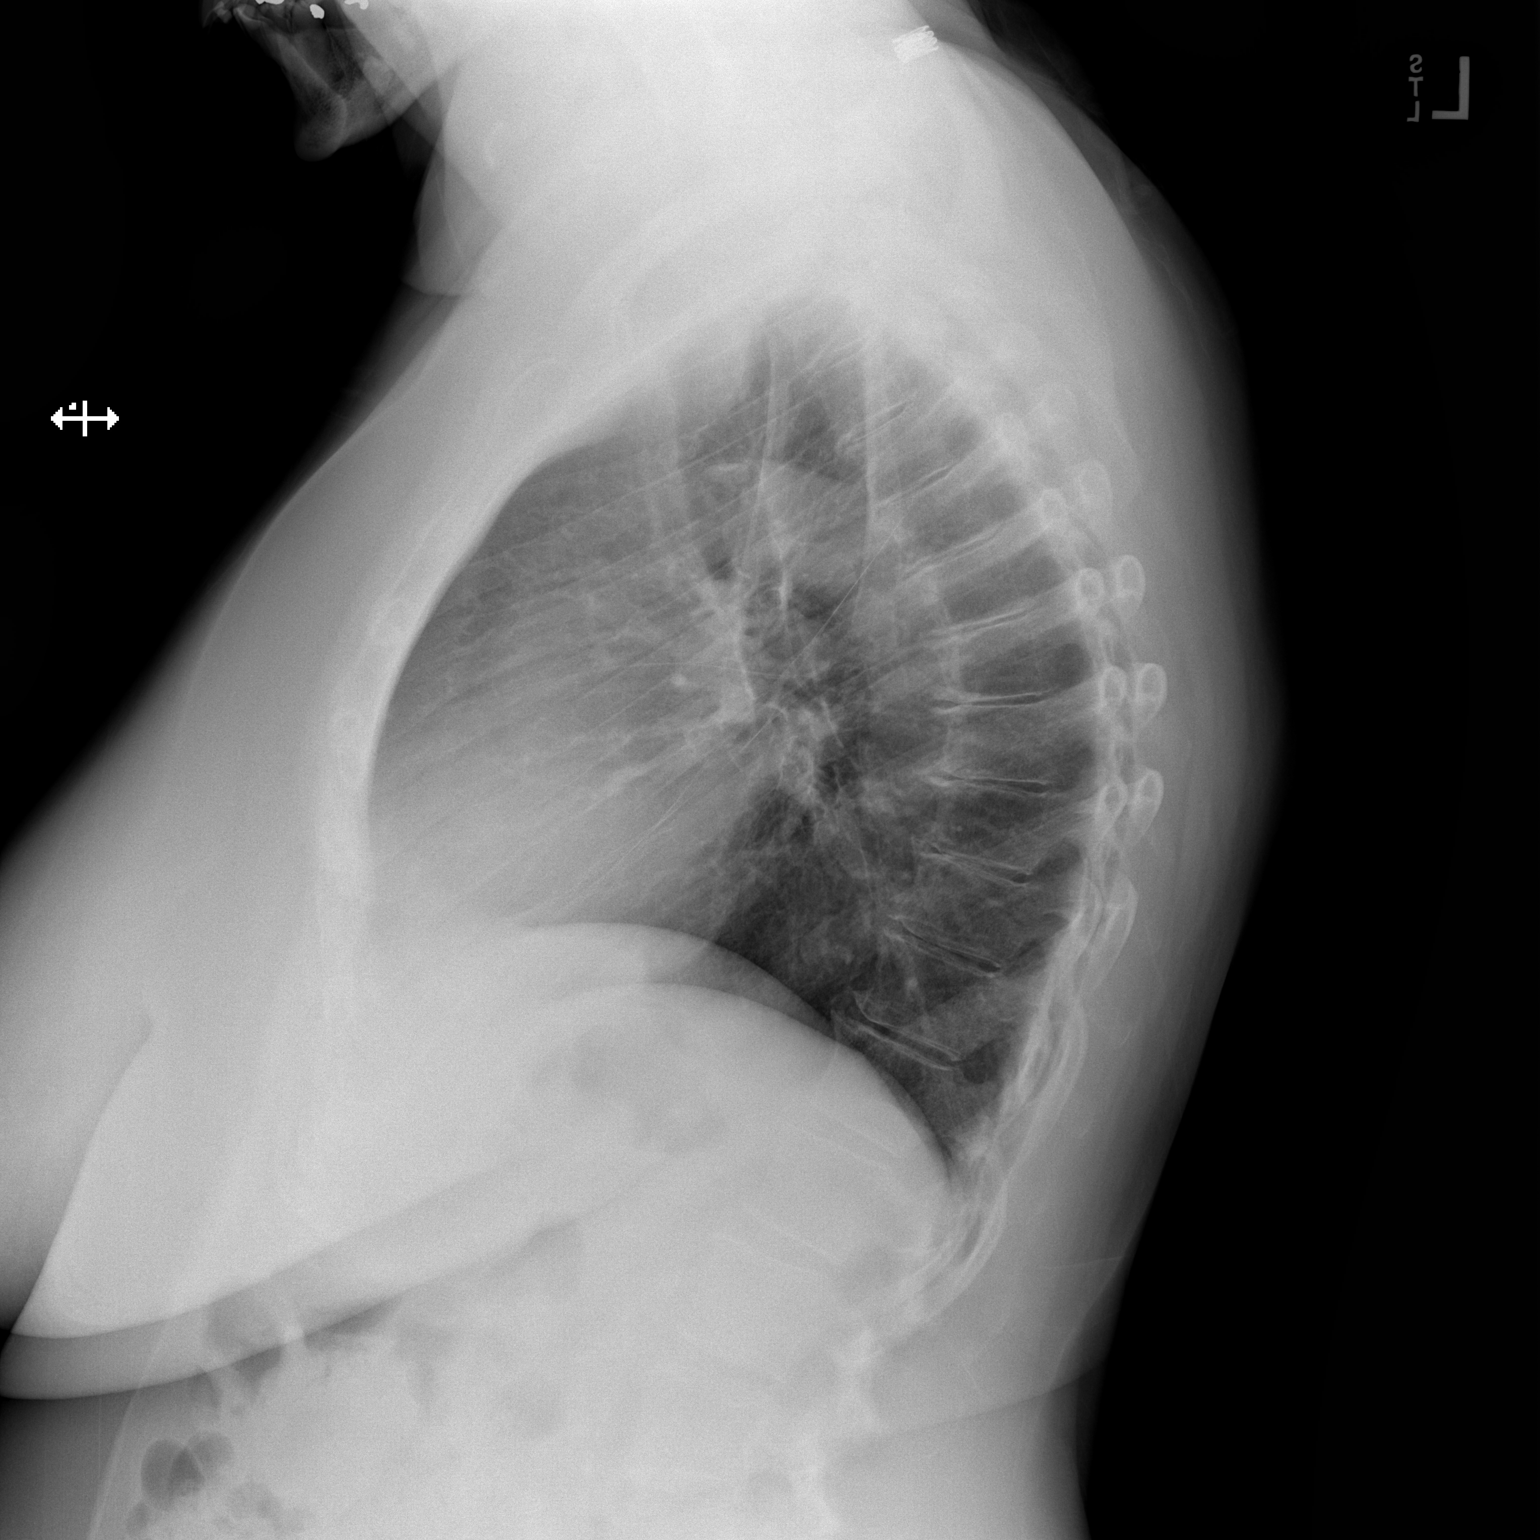

[2 of 2 positions shown; findings below may reference images not displayed]

FINDINGS: The heart size and mediastinal contours are within normal limits.
Both lungs are clear. The visualized skeletal structures are
unremarkable.
IMPRESSION: No active cardiopulmonary disease.

## 2019-03-18 ENCOUNTER — Encounter: Payer: Self-pay | Admitting: Emergency Medicine

## 2019-03-22 ENCOUNTER — Telehealth (INDEPENDENT_AMBULATORY_CARE_PROVIDER_SITE_OTHER): Payer: PPO | Admitting: Emergency Medicine

## 2019-03-22 ENCOUNTER — Encounter: Payer: Self-pay | Admitting: Emergency Medicine

## 2019-03-22 VITALS — Ht 61.0 in | Wt 158.0 lb

## 2019-03-22 DIAGNOSIS — Z7189 Other specified counseling: Secondary | ICD-10-CM

## 2019-03-22 NOTE — Progress Notes (Signed)
Covid 19 Vaccine questions   Question 1 4-6 weeks prior to mammogram. Can she wait until after June 9th to get vaccine since that is when her mammogram was scheduled.  Question 2 Been taking Allegra OTC and should she still take that OTC medication and get the Covid vaccine.     Telemedicine Encounter- SOAP NOTE Established Patient MyChart video virtual visit. This video telephone encounter was conducted with the patient's (or proxy's) verbal consent via audio video telecommunications: yes/no: Yes Patient was instructed to have this encounter in a suitably private space; and to only have persons present to whom they give permission to participate. In addition, patient identity was confirmed by use of name plus two identifiers (DOB and address).  I discussed the limitations, risks, security and privacy concerns of performing an evaluation and management service by telephone and the availability of in person appointments. I also discussed with the patient that there may be a patient responsible charge related to this service. The patient expressed understanding and agreed to proceed.  I spent a total of TIME; 0 MIN TO 60 MIN: 15 minutes talking with the patient or their proxy.  Chief Complaint  Patient presents with  . Covid 19 Vaccine questions    Subjective   Brenda [name] is a 74 y.o. female established patient. Telephone visit today for concerns about Covid vaccine. Concern #1 details with timing of vaccine and schedule mammogram for June 2021. Concern #2 deals with antihistamine, over-the-counter Allegra, and vaccine administration.  HPI   Patient Active Problem List   Diagnosis Date Noted  . Prediabetes 02/16/2019  . Diabetes mellitus without complication ([place]) 48/18/5631  . Dyslipidemia 12/20/2008  . DIVERTICULOSIS, COLON 11/11/2006  . Hypertension, essential 06/24/2006  . MENOPAUSAL SYNDROME 06/24/2006  . COLONIC POLYPS, HX OF 06/24/2006    Past Medical History:   Diagnosis Date  . Allergy    seasonal  . Chronic rhinitis 01/19/2007  . COLONIC POLYPS, HX OF 06/24/2006  . DIVERTICULOSIS, COLON 11/11/2006  . HYPERLIPIDEMIA 12/20/2008  . HYPERTENSION 06/24/2006  . Impaired glucose tolerance   . MENOPAUSAL SYNDROME 06/24/2006    Current Outpatient Medications  Medication Sig Dispense Refill  . amLODipine (NORVASC) 5 MG tablet Take 1 tablet by mouth once daily 30 tablet 0  . dorzolamide-timolol (COSOPT) 22.3-6.8 MG/ML ophthalmic solution     . fexofenadine (ALLEGRA ALLERGY) 180 MG tablet Take 180 mg by mouth daily.    Marland Kitchen latanoprost (XALATAN) 0.005 % ophthalmic solution Place 1 drop into both eyes at bedtime.    Marland Kitchen lisinopril-hydrochlorothiazide (ZESTORETIC) 20-12.5 MG tablet Take 1 tablet by mouth daily. 90 tablet 3  . rosuvastatin (CRESTOR) 10 MG tablet Take 1 tablet (10 mg total) by mouth daily. 90 tablet 3   No current facility-administered medications for this visit.    No Known Allergies  Social History   Socioeconomic History  . Marital status: Married    Spouse name: Not on file  . Number of children: Not on file  . Years of education: Not on file  . Highest education level: Not on file  Occupational History  . Not on file  Tobacco Use  . Smoking status: Never Smoker  . Smokeless tobacco: Never Used  Substance and Sexual Activity  . Alcohol use: No  . Drug use: No  . Sexual activity: Not on file  Other Topics Concern  . Not on file  Social History Narrative  . Not on file   Social Determinants of Health  Financial Resource Strain:   . Difficulty of Paying Living Expenses: Not on file  Food Insecurity:   . Worried About Programme researcher, broadcasting/film/video in the Last Year: Not on file  . Ran Out of Food in the Last Year: Not on file  Transportation Needs:   . Lack of Transportation (Medical): Not on file  . Lack of Transportation (Non-Medical): Not on file  Physical Activity:   . Days of Exercise per Week: Not on file  . Minutes of  Exercise per Session: Not on file  Stress:   . Feeling of Stress : Not on file  Social Connections:   . Frequency of Communication with Friends and Family: Not on file  . Frequency of Social Gatherings with Friends and Family: Not on file  . Attends Religious Services: Not on file  . Active Member of Clubs or Organizations: Not on file  . Attends Banker Meetings: Not on file  . Marital Status: Not on file  Intimate Partner Violence:   . Fear of Current or Ex-Partner: Not on file  . Emotionally Abused: Not on file  . Physically Abused: Not on file  . Sexually Abused: Not on file    Review of Systems  Constitutional: Negative.  Negative for chills and fever.  HENT: Negative for congestion and sore throat.   Respiratory: Negative.  Negative for cough and shortness of breath.   Cardiovascular: Negative.  Negative for chest pain and palpitations.  Gastrointestinal: Negative.  Negative for abdominal pain, nausea and vomiting.  Genitourinary: Negative.  Negative for dysuria and hematuria.  Musculoskeletal: Negative.  Negative for back pain, myalgias and neck pain.  Skin: Negative.  Negative for rash.  Neurological: Negative.  Negative for dizziness and headaches.  All other systems reviewed and are negative.   Objective  Alert and oriented x3 in no apparent respiratory distress. Vitals as reported by the patient: Today's Vitals   03/22/19 1545  Weight: 158 lb (71.7 kg)  Height: 5\' 1"  (1.549 m)    There are no diagnoses linked to this encounter. Wannetta was seen today for covid 19 vaccine questions.  Diagnoses and all orders for this visit:  Advice given about COVID-19 virus infection     I discussed the assessment and treatment plan with the patient. The patient was provided an opportunity to ask questions and all were answered. The patient agreed with the plan and demonstrated an understanding of the instructions.   The patient was advised to call back or seek  an in-person evaluation if the symptoms worsen or if the condition fails to improve as anticipated.  I provided 15 minutes of non-face-to-face time during this encounter.  , MD  Primary Care at [facility] [place]

## 2019-03-22 NOTE — Patient Instructions (Signed)
° ° ° °  If you have lab work done today you will be contacted with your lab results within the next 2 weeks.  If you have not heard from us then please contact us. The fastest way to get your results is to register for My Chart. ° ° °IF you received an x-ray today, you will receive an invoice from [facility]. Please contact [facility] at [phone] with questions or concerns regarding your invoice.  ° °IF you received labwork today, you will receive an invoice from [facility]. Please contact [facility] at [phone] with questions or concerns regarding your invoice.  ° °Our billing staff will not be able to assist you with questions regarding bills from these companies. ° °You will be contacted with the lab results as soon as they are available. The fastest way to get your results is to activate your My Chart account. Instructions are located on the last page of this paperwork. If you have not heard from us regarding the results in 2 weeks, please contact this office. °  ° ° ° °

## 2019-03-28 ENCOUNTER — Other Ambulatory Visit: Payer: Self-pay | Admitting: Emergency Medicine

## 2019-03-28 DIAGNOSIS — I10 Essential (primary) hypertension: Secondary | ICD-10-CM

## 2019-03-29 MED ORDER — AMLODIPINE BESYLATE 5 MG PO TABS: 5.0000 mg | ORAL_TABLET | Freq: Every day | ORAL | 0 refills | Status: DC

## 2019-06-20 ENCOUNTER — Other Ambulatory Visit: Payer: Self-pay | Admitting: Emergency Medicine

## 2019-06-20 DIAGNOSIS — I10 Essential (primary) hypertension: Secondary | ICD-10-CM

## 2019-06-22 MED ORDER — AMLODIPINE BESYLATE 5 MG PO TABS: 5.0000 mg | ORAL_TABLET | Freq: Every day | ORAL | 0 refills | Status: DC

## 2019-06-22 NOTE — Telephone Encounter (Signed)
06/22/2019 - PATIENT REQUESTING A REFILL ON AMLODIPINE (NORVASC) 5 MG TABLET. SHE HAS A 6 MONTH FOLLOW-UP WITH DR. Irving Shows ON 08/10/2019. MBC

## 2019-06-30 DIAGNOSIS — Z1231 Encounter for screening mammogram for malignant neoplasm of breast: Secondary | ICD-10-CM | POA: Diagnosis not present

## 2019-06-30 LAB — HM MAMMOGRAPHY

## 2019-07-01 ENCOUNTER — Encounter: Payer: Self-pay | Admitting: *Deleted

## 2019-08-10 ENCOUNTER — Encounter: Payer: Self-pay | Admitting: Emergency Medicine

## 2019-08-10 ENCOUNTER — Ambulatory Visit (INDEPENDENT_AMBULATORY_CARE_PROVIDER_SITE_OTHER): Payer: PPO | Admitting: Emergency Medicine

## 2019-08-10 ENCOUNTER — Other Ambulatory Visit: Payer: Self-pay

## 2019-08-10 VITALS — BP 142/70 | HR 80 | Temp 97.2°F | Resp 16 | Ht 61.0 in | Wt 150.6 lb

## 2019-08-10 DIAGNOSIS — R7303 Prediabetes: Secondary | ICD-10-CM

## 2019-08-10 DIAGNOSIS — I10 Essential (primary) hypertension: Secondary | ICD-10-CM

## 2019-08-10 DIAGNOSIS — E785 Hyperlipidemia, unspecified: Secondary | ICD-10-CM | POA: Diagnosis not present

## 2019-08-10 MED ORDER — LISINOPRIL-HYDROCHLOROTHIAZIDE 20-12.5 MG PO TABS: 1.0000 | ORAL_TABLET | Freq: Every day | ORAL | 3 refills | Status: DC

## 2019-08-10 NOTE — Progress Notes (Signed)
Brenda [name] 74 y.o.   Chief Complaint  Patient presents with  . Hypertension    and cholesterol follow up    HISTORY OF PRESENT ILLNESS: This is a 74 y.o. female with chronic medical problems here for follow-up: 1.  Hypertension: On amlodipine 5 mg daily and Zestoretic 20-12.5 mg daily.  Does not record blood pressures at home but does have a machine. 2.  Prediabetes 3.  Dyslipidemia: On rosuvastatin 10 mg daily Has no complaints or medical concerns today. Fully vaccinated against Covid.  HPI   Prior to Admission medications   Medication Sig Start Date End Date Taking? Authorizing Provider  amLODipine (NORVASC) 5 MG tablet Take 1 tablet (5 mg total) by mouth daily. 06/22/19  Yes Georgina Quint, MD  dorzolamide-timolol (COSOPT) 22.3-6.8 MG/ML ophthalmic solution  02/25/17  Yes [provider]  fexofenadine (ALLEGRA ALLERGY) 180 MG tablet Take 180 mg by mouth daily.   Yes [provider]  latanoprost (XALATAN) 0.005 % ophthalmic solution Place 1 drop into both eyes at bedtime. 05/02/16  Yes [provider]  lisinopril-hydrochlorothiazide (ZESTORETIC) 20-12.5 MG tablet Take 1 tablet by mouth daily. 11/13/18  Yes Judine Arciniega, Eilleen Kempf, MD  rosuvastatin (CRESTOR) 10 MG tablet Take 1 tablet (10 mg total) by mouth daily. 02/16/19  Yes SagardiaEilleen Kempf, MD    No Known Allergies  Patient Active Problem List   Diagnosis Date Noted  . Prediabetes 02/16/2019  . Diabetes mellitus without complication (HCC) 10/11/2014  . Dyslipidemia 12/20/2008  . DIVERTICULOSIS, COLON 11/11/2006  . Hypertension, essential 06/24/2006  . MENOPAUSAL SYNDROME 06/24/2006  . COLONIC POLYPS, HX OF 06/24/2006    Past Medical History:  Diagnosis Date  . Allergy    seasonal  . Chronic rhinitis 01/19/2007  . COLONIC POLYPS, HX OF 06/24/2006  . DIVERTICULOSIS, COLON 11/11/2006  . HYPERLIPIDEMIA 12/20/2008  . HYPERTENSION 06/24/2006  . Impaired glucose tolerance   .  MENOPAUSAL SYNDROME 06/24/2006    Past Surgical History:  Procedure Laterality Date  . BREAST BIOPSY    . COLONOSCOPY    . POLYPECTOMY    . TUBAL LIGATION  1994   benign    Social History   Socioeconomic History  . Marital status: Married    Spouse name: Not on file  . Number of children: Not on file  . Years of education: Not on file  . Highest education level: Not on file  Occupational History  . Not on file  Tobacco Use  . Smoking status: Never Smoker  . Smokeless tobacco: Never Used  Vaping Use  . Vaping Use: Never used  Substance and Sexual Activity  . Alcohol use: No  . Drug use: No  . Sexual activity: Not on file  Other Topics Concern  . Not on file  Social History Narrative  . Not on file   Social Determinants of Health   Financial Resource Strain:   . Difficulty of Paying Living Expenses:   Food Insecurity:   . Worried About Programme researcher, broadcasting/film/video in the Last Year:   . Barista in the Last Year:   Transportation Needs:   . Freight forwarder (Medical):   Marland Kitchen Lack of Transportation (Non-Medical):   Physical Activity:   . Days of Exercise per Week:   . Minutes of Exercise per Session:   Stress:   . Feeling of Stress :   Social Connections:   . Frequency of Communication with Friends and Family:   . Frequency  of Social Gatherings with Friends and Family:   . Attends Religious Services:   . Active Member of Clubs or Organizations:   . Attends BankerClub or Organization Meetings:   Marland Kitchen. Marital Status:   Intimate Partner Violence:   . Fear of Current or Ex-Partner:   . Emotionally Abused:   Marland Kitchen. Physically Abused:   . Sexually Abused:     Family History  Problem Relation Age of Onset  . Mental illness Mother   . Cancer Father        lung  . Parkinsonism Brother   . Colon cancer Neg Hx      Review of Systems  Constitutional: Negative.  Negative for fever.  HENT: Negative.  Negative for congestion and sore throat.   Respiratory: Negative.   Negative for cough and shortness of breath.   Cardiovascular: Negative.  Negative for chest pain and palpitations.  Gastrointestinal: Negative.  Negative for abdominal pain, diarrhea, nausea and vomiting.  Genitourinary: Negative.  Negative for dysuria and hematuria.  Musculoskeletal: Negative.  Negative for back pain, myalgias and neck pain.  Skin: Negative.  Negative for rash.  Neurological: Negative.  Negative for dizziness and headaches.  All other systems reviewed and are negative.  Today's Vitals   08/10/19 0818  BP: (!) 180/89  Pulse: 80  Resp: 16  Temp: (!) 97.2 F (36.2 C)  TempSrc: Temporal  SpO2: 99%  Weight: 150 lb 9.6 oz (68.3 kg)  Height: 5\' 1"  (1.549 m)   Body mass index is 28.46 kg/m.   Physical Exam Vitals reviewed.  Constitutional:      Appearance: Normal appearance.  HENT:     Head: Normocephalic.  Eyes:     Extraocular Movements: Extraocular movements intact.     Conjunctiva/sclera: Conjunctivae normal.     Pupils: Pupils are equal, round, and reactive to light.  Cardiovascular:     Rate and Rhythm: Normal rate and regular rhythm.     Pulses: Normal pulses.     Heart sounds: Normal heart sounds.  Pulmonary:     Effort: Pulmonary effort is normal.     Breath sounds: Normal breath sounds.  Musculoskeletal:        General: Normal range of motion.     Cervical back: Normal range of motion and neck supple.  Skin:    General: Skin is warm and dry.     Capillary Refill: Capillary refill takes less than 2 seconds.  Neurological:     General: No focal deficit present.     Mental Status: She is alert and oriented to person, place, and time.  Psychiatric:        Mood and Affect: Mood normal.        Behavior: Behavior normal.    A total of 30 minutes was spent with the patient, greater than 50% of which was in counseling/coordination of care regarding chronic medical problems and cardiovascular risks associated with these conditions, review of all  medications, review of most recent office visit notes, review of most recent blood work results, diet and nutrition, prognosis and need for follow-up in 6 months.   ASSESSMENT & PLAN: Hypertension, essential Acceptable blood pressure numbers.  Continue present medications.  No changes. Follow-up in 6 months.  Milta was seen today for hypertension.  Diagnoses and all orders for this visit:  Hypertension, essential -     Comprehensive metabolic panel -     lisinopril-hydrochlorothiazide (ZESTORETIC) 20-12.5 MG tablet; Take 1 tablet by mouth daily.  Prediabetes -  Hemoglobin A1c  Dyslipidemia -     Lipid panel    Patient Instructions       If you have lab work done today you will be contacted with your lab results within the next 2 weeks.  If you have not heard from [place] then please contact us. The fastest way to get your results is to register for My Chart.   IF you received an x-ray today, you will receive an invoice from [facility]. Please contact [facility] at [phone] with questions or concerns regarding your invoice.   IF you received labwork today, you will receive an invoice from [place]. Please contact [facility] at [phone] with questions or concerns regarding your invoice.   Our billing staff will not be able to assist you with questions regarding bills from these companies.  You will be contacted with the lab results as soon as they are available. The fastest way to get your results is to activate your My Chart account. Instructions are located on the last page of this paperwork. If you have not heard from [place] regarding the results in 2 weeks, please contact this office.     Hypertension, Adult High blood pressure (hypertension) is when the force of blood pumping through the arteries is too strong. The arteries are the blood vessels that carry blood from the heart throughout the body. Hypertension forces the heart to work harder to pump  blood and may cause arteries to become narrow or stiff. Untreated or uncontrolled hypertension can cause a heart attack, heart failure, a stroke, kidney disease, and other problems. A blood pressure reading consists of a higher number over a lower number. Ideally, your blood pressure should be below 120/80. The first ("top") number is called the systolic pressure. It is a measure of the pressure in your arteries as your heart beats. The second ("bottom") number is called the diastolic pressure. It is a measure of the pressure in your arteries as the heart relaxes. What are the causes? The exact cause of this condition is not known. There are some conditions that result in or are related to high blood pressure. What increases the risk? Some risk factors for high blood pressure are under your control. The following factors may make you more likely to develop this condition:  Smoking.  Having type 2 diabetes mellitus, high cholesterol, or both.  Not getting enough exercise or physical activity.  Being overweight.  Having too much fat, sugar, calories, or salt (sodium) in your diet.  Drinking too much alcohol. Some risk factors for high blood pressure may be difficult or impossible to change. Some of these factors include:  Having chronic kidney disease.  Having a family history of high blood pressure.  Age. Risk increases with age.  Race. You may be at higher risk if you are African American.  Gender. Men are at higher risk than women before age 59. After age 60, women are at higher risk than men.  Having obstructive sleep apnea.  Stress. What are the signs or symptoms? High blood pressure may not cause symptoms. Very high blood pressure (hypertensive crisis) may cause:  Headache.  Anxiety.  Shortness of breath.  Nosebleed.  Nausea and vomiting.  Vision changes.  Severe chest pain.  Seizures. How is this diagnosed? This condition is diagnosed by measuring your blood  pressure while you are seated, with your arm resting on a flat surface, your legs uncrossed, and your feet flat on the floor. The cuff of the  blood pressure monitor will be placed directly against the skin of your upper arm at the level of your heart. It should be measured at least twice using the same arm. Certain conditions can cause a difference in blood pressure between your right and left arms. Certain factors can cause blood pressure readings to be lower or higher than normal for a short period of time:  When your blood pressure is higher when you are in a health care provider's office than when you are at home, this is called white coat hypertension. Most people with this condition do not need medicines.  When your blood pressure is higher at home than when you are in a health care provider's office, this is called masked hypertension. Most people with this condition may need medicines to control blood pressure. If you have a high blood pressure reading during one visit or you have normal blood pressure with other risk factors, you may be asked to:  Return on a different day to have your blood pressure checked again.  Monitor your blood pressure at home for 1 week or longer. If you are diagnosed with hypertension, you may have other blood or imaging tests to help your health care provider understand your overall risk for other conditions. How is this treated? This condition is treated by making healthy lifestyle changes, such as eating healthy foods, exercising more, and reducing your alcohol intake. Your health care provider may prescribe medicine if lifestyle changes are not enough to get your blood pressure under control, and if:  Your systolic blood pressure is above 130.  Your diastolic blood pressure is above 80. Your personal target blood pressure may vary depending on your medical conditions, your age, and other factors. Follow these instructions at home: Eating and  drinking   Eat a diet that is high in fiber and potassium, and low in sodium, added sugar, and fat. An example eating plan is called the DASH (Dietary Approaches to Stop Hypertension) diet. To eat this way: ? Eat plenty of fresh fruits and vegetables. Try to fill one half of your plate at each meal with fruits and vegetables. ? Eat whole grains, such as whole-wheat pasta, brown rice, or whole-grain bread. Fill about one fourth of your plate with whole grains. ? Eat or drink low-fat dairy products, such as skim milk or low-fat yogurt. ? Avoid fatty cuts of meat, processed or cured meats, and poultry with skin. Fill about one fourth of your plate with lean proteins, such as fish, chicken without skin, beans, eggs, or tofu. ? Avoid pre-made and processed foods. These tend to be higher in sodium, added sugar, and fat.  Reduce your daily sodium intake. Most people with hypertension should eat less than 1,500 mg of sodium a day.  Do not drink alcohol if: ? Your health care provider tells you not to drink. ? You are pregnant, may be pregnant, or are planning to become pregnant.  If you drink alcohol: ? Limit how much you use to:  0-1 drink a day for women.  0-2 drinks a day for men. ? Be aware of how much alcohol is in your drink. In the [place] one drink equals one 12 oz bottle of beer (355 mL), one 5 oz glass of wine (148 mL), or one 1 oz glass of hard liquor (44 mL). Lifestyle   Work with your health care provider to maintain a healthy body weight or to lose weight. Ask what an ideal weight is for you.  Get at least 30 minutes of exercise most days of the week. Activities may include walking, swimming, or biking.  Include exercise to strengthen your muscles (resistance exercise), such as Pilates or lifting weights, as part of your weekly exercise routine. Try to do these types of exercises for 30 minutes at least 3 days a week.  Do not use any products that contain nicotine or tobacco,  such as cigarettes, e-cigarettes, and chewing tobacco. If you need help quitting, ask your health care provider.  Monitor your blood pressure at home as told by your health care provider.  Keep all follow-up visits as told by your health care provider. This is important. Medicines  Take over-the-counter and prescription medicines only as told by your health care provider. Follow directions carefully. Blood pressure medicines must be taken as prescribed.  Do not skip doses of blood pressure medicine. Doing this puts you at risk for problems and can make the medicine less effective.  Ask your health care provider about side effects or reactions to medicines that you should watch for. Contact a health care provider if you:  Think you are having a reaction to a medicine you are taking.  Have headaches that keep coming back (recurring).  Feel dizzy.  Have swelling in your ankles.  Have trouble with your vision. Get help right away if you:  Develop a severe headache or confusion.  Have unusual weakness or numbness.  Feel faint.  Have severe pain in your chest or abdomen.  Vomit repeatedly.  Have trouble breathing. Summary  Hypertension is when the force of blood pumping through your arteries is too strong. If this condition is not controlled, it may put you at risk for serious complications.  Your personal target blood pressure may vary depending on your medical conditions, your age, and other factors. For most people, a normal blood pressure is less than 120/80.  Hypertension is treated with lifestyle changes, medicines, or a combination of both. Lifestyle changes include losing weight, eating a healthy, low-sodium diet, exercising more, and limiting alcohol. This information is not intended to replace advice given to you by your health care provider. Make sure you discuss any questions you have with your health care provider. Document Revised: 09/17/2017 Document Reviewed:  09/17/2017 Elsevier Patient Education  2020 [employer]      Edwina Barth, MD [facility]

## 2019-08-10 NOTE — Patient Instructions (Addendum)
   If you have lab work done today you will be contacted with your lab results within the next 2 weeks.  If you have not heard from us then please contact us. The fastest way to get your results is to register for My Chart.   IF you received an x-ray today, you will receive an invoice from [facility]. Please contact [facility] at [phone] with questions or concerns regarding your invoice.   IF you received labwork today, you will receive an invoice from [facility]. Please contact [facility] at [phone] with questions or concerns regarding your invoice.   Our billing staff will not be able to assist you with questions regarding bills from these companies.  You will be contacted with the lab results as soon as they are available. The fastest way to get your results is to activate your My Chart account. Instructions are located on the last page of this paperwork. If you have not heard from us regarding the results in 2 weeks, please contact this office.      Hypertension, Adult High blood pressure (hypertension) is when the force of blood pumping through the arteries is too strong. The arteries are the blood vessels that carry blood from the heart throughout the body. Hypertension forces the heart to work harder to pump blood and may cause arteries to become narrow or stiff. Untreated or uncontrolled hypertension can cause a heart attack, heart failure, a stroke, kidney disease, and other problems. A blood pressure reading consists of a higher number over a lower number. Ideally, your blood pressure should be below 120/80. The first ("top") number is called the systolic pressure. It is a measure of the pressure in your arteries as your heart beats. The second ("bottom") number is called the diastolic pressure. It is a measure of the pressure in your arteries as the heart relaxes. What are the causes? The exact cause of this condition is not known. There are some conditions  that result in or are related to high blood pressure. What increases the risk? Some risk factors for high blood pressure are under your control. The following factors may make you more likely to develop this condition:  Smoking.  Having type 2 diabetes mellitus, high cholesterol, or both.  Not getting enough exercise or physical activity.  Being overweight.  Having too much fat, sugar, calories, or salt (sodium) in your diet.  Drinking too much alcohol. Some risk factors for high blood pressure may be difficult or impossible to change. Some of these factors include:  Having chronic kidney disease.  Having a family history of high blood pressure.  Age. Risk increases with age.  Race. You may be at higher risk if you are African American.  Gender. Men are at higher risk than women before age 45. After age 65, women are at higher risk than men.  Having obstructive sleep apnea.  Stress. What are the signs or symptoms? High blood pressure may not cause symptoms. Very high blood pressure (hypertensive crisis) may cause:  Headache.  Anxiety.  Shortness of breath.  Nosebleed.  Nausea and vomiting.  Vision changes.  Severe chest pain.  Seizures. How is this diagnosed? This condition is diagnosed by measuring your blood pressure while you are seated, with your arm resting on a flat surface, your legs uncrossed, and your feet flat on the floor. The cuff of the blood pressure monitor will be placed directly against the skin of your upper arm at the level of your   heart. It should be measured at least twice using the same arm. Certain conditions can cause a difference in blood pressure between your right and left arms. Certain factors can cause blood pressure readings to be lower or higher than normal for a short period of time:  When your blood pressure is higher when you are in a health care provider's office than when you are at home, this is called white coat hypertension.  Most people with this condition do not need medicines.  When your blood pressure is higher at home than when you are in a health care provider's office, this is called masked hypertension. Most people with this condition may need medicines to control blood pressure. If you have a high blood pressure reading during one visit or you have normal blood pressure with other risk factors, you may be asked to:  Return on a different day to have your blood pressure checked again.  Monitor your blood pressure at home for 1 week or longer. If you are diagnosed with hypertension, you may have other blood or imaging tests to help your health care provider understand your overall risk for other conditions. How is this treated? This condition is treated by making healthy lifestyle changes, such as eating healthy foods, exercising more, and reducing your alcohol intake. Your health care provider may prescribe medicine if lifestyle changes are not enough to get your blood pressure under control, and if:  Your systolic blood pressure is above 130.  Your diastolic blood pressure is above 80. Your personal target blood pressure may vary depending on your medical conditions, your age, and other factors. Follow these instructions at home: Eating and drinking   Eat a diet that is high in fiber and potassium, and low in sodium, added sugar, and fat. An example eating plan is called the DASH (Dietary Approaches to Stop Hypertension) diet. To eat this way: ? Eat plenty of fresh fruits and vegetables. Try to fill one half of your plate at each meal with fruits and vegetables. ? Eat whole grains, such as whole-wheat pasta, brown rice, or whole-grain bread. Fill about one fourth of your plate with whole grains. ? Eat or drink low-fat dairy products, such as skim milk or low-fat yogurt. ? Avoid fatty cuts of meat, processed or cured meats, and poultry with skin. Fill about one fourth of your plate with lean proteins, such  as fish, chicken without skin, beans, eggs, or tofu. ? Avoid pre-made and processed foods. These tend to be higher in sodium, added sugar, and fat.  Reduce your daily sodium intake. Most people with hypertension should eat less than 1,500 mg of sodium a day.  Do not drink alcohol if: ? Your health care provider tells you not to drink. ? You are pregnant, may be pregnant, or are planning to become pregnant.  If you drink alcohol: ? Limit how much you use to:  0-1 drink a day for women.  0-2 drinks a day for men. ? Be aware of how much alcohol is in your drink. In the [place] one drink equals one 12 oz bottle of beer (355 mL), one 5 oz glass of wine (148 mL), or one 1 oz glass of hard liquor (44 mL). Lifestyle   Work with your health care provider to maintain a healthy body weight or to lose weight. Ask what an ideal weight is for you.  Get at least 30 minutes of exercise most days of the week. Activities may include walking, swimming,   or biking.  Include exercise to strengthen your muscles (resistance exercise), such as Pilates or lifting weights, as part of your weekly exercise routine. Try to do these types of exercises for 30 minutes at least 3 days a week.  Do not use any products that contain nicotine or tobacco, such as cigarettes, e-cigarettes, and chewing tobacco. If you need help quitting, ask your health care provider.  Monitor your blood pressure at home as told by your health care provider.  Keep all follow-up visits as told by your health care provider. This is important. Medicines  Take over-the-counter and prescription medicines only as told by your health care provider. Follow directions carefully. Blood pressure medicines must be taken as prescribed.  Do not skip doses of blood pressure medicine. Doing this puts you at risk for problems and can make the medicine less effective.  Ask your health care provider about side effects or reactions to medicines that you  should watch for. Contact a health care provider if you:  Think you are having a reaction to a medicine you are taking.  Have headaches that keep coming back (recurring).  Feel dizzy.  Have swelling in your ankles.  Have trouble with your vision. Get help right away if you:  Develop a severe headache or confusion.  Have unusual weakness or numbness.  Feel faint.  Have severe pain in your chest or abdomen.  Vomit repeatedly.  Have trouble breathing. Summary  Hypertension is when the force of blood pumping through your arteries is too strong. If this condition is not controlled, it may put you at risk for serious complications.  Your personal target blood pressure may vary depending on your medical conditions, your age, and other factors. For most people, a normal blood pressure is less than 120/80.  Hypertension is treated with lifestyle changes, medicines, or a combination of both. Lifestyle changes include losing weight, eating a healthy, low-sodium diet, exercising more, and limiting alcohol. This information is not intended to replace advice given to you by your health care provider. Make sure you discuss any questions you have with your health care provider. Document Revised: 09/17/2017 Document Reviewed: 09/17/2017 Elsevier Patient Education  2020 Elsevier Inc.  

## 2019-08-10 NOTE — Assessment & Plan Note (Signed)
Acceptable blood pressure numbers.  Continue present medications.  No changes. Follow-up in 6 months.

## 2019-08-11 LAB — HEMOGLOBIN A1C
Est. average glucose Bld gHb Est-mCnc: 140 mg/dL
Hgb A1c MFr Bld: 6.5 % — ABNORMAL HIGH (ref 4.8–5.6)

## 2019-08-11 LAB — LIPID PANEL
Chol/HDL Ratio: 2.7 ratio (ref 0.0–4.4)
Cholesterol, Total: 114 mg/dL (ref 100–199)
HDL: 43 mg/dL (ref >39)
LDL Chol Calc (NIH): 52 mg/dL (ref 0–99)
Triglycerides: 100 mg/dL (ref 0–149)
VLDL Cholesterol Cal: 19 mg/dL (ref 5–40)

## 2019-08-11 LAB — COMPREHENSIVE METABOLIC PANEL
ALT: 17 IU/L (ref 0–32)
AST: 25 IU/L (ref 0–40)
Albumin/Globulin Ratio: 1.8 (ref 1.2–2.2)
Albumin: 4.6 g/dL (ref 3.7–4.7)
Alkaline Phosphatase: 71 IU/L (ref 48–121)
BUN/Creatinine Ratio: 14 (ref 12–28)
BUN: 12 mg/dL (ref 8–27)
Bilirubin Total: 0.7 mg/dL (ref 0.0–1.2)
CO2: 28 mmol/L (ref 20–29)
Calcium: 9.9 mg/dL (ref 8.7–10.3)
Chloride: 101 mmol/L (ref 96–106)
Creatinine, Ser: 0.84 mg/dL (ref 0.57–1.00)
GFR calc Af Amer: 79 mL/min/{1.73_m2} (ref >59)
GFR calc non Af Amer: 69 mL/min/{1.73_m2} (ref >59)
Globulin, Total: 2.5 g/dL (ref 1.5–4.5)
Glucose: 154 mg/dL — ABNORMAL HIGH (ref 65–99)
Potassium: 3.4 mmol/L — ABNORMAL LOW (ref 3.5–5.2)
Sodium: 143 mmol/L (ref 134–144)
Total Protein: 7.1 g/dL (ref 6.0–8.5)

## 2019-09-22 ENCOUNTER — Other Ambulatory Visit: Payer: Self-pay | Admitting: Emergency Medicine

## 2019-09-22 DIAGNOSIS — I10 Essential (primary) hypertension: Secondary | ICD-10-CM

## 2019-09-23 MED ORDER — AMLODIPINE BESYLATE 5 MG PO TABS: 5.0000 mg | ORAL_TABLET | Freq: Every day | ORAL | 0 refills | Status: DC

## 2019-09-23 NOTE — Telephone Encounter (Signed)
09/23/2019 - PATIENT REQUESTING A REFILL ON AMLODIPINE (NORVASC) 5 mg. SHE WAS SEEN LAST ON 08/10/2019 BY DR. Irving Shows. HE WANTED HER TO RETURN FOR A 6 MONTH FOLLOW-UP ON HER HYPERTENSION. HER APPOINTMENT IS SCHEDULED FOR THURS. 02/10/2020 AT 8:20 am.  I WILL ROUTE THIS MESSAGE BACK TO THE CLINICAL TEAM FOR REVIEW. MBC

## 2019-09-24 DIAGNOSIS — H2512 Age-related nuclear cataract, left eye: Secondary | ICD-10-CM | POA: Diagnosis not present

## 2019-09-24 DIAGNOSIS — H401132 Primary open-angle glaucoma, bilateral, moderate stage: Secondary | ICD-10-CM | POA: Diagnosis not present

## 2019-09-24 DIAGNOSIS — H2513 Age-related nuclear cataract, bilateral: Secondary | ICD-10-CM | POA: Diagnosis not present

## 2019-10-11 ENCOUNTER — Other Ambulatory Visit: Payer: Self-pay

## 2019-10-11 ENCOUNTER — Ambulatory Visit (INDEPENDENT_AMBULATORY_CARE_PROVIDER_SITE_OTHER): Payer: PPO | Admitting: Emergency Medicine

## 2019-10-11 DIAGNOSIS — Z23 Encounter for immunization: Secondary | ICD-10-CM

## 2019-10-11 NOTE — Progress Notes (Signed)
Patient received influenza vaccine Fluad quad in R delt , pt tolerated it well.

## 2019-10-21 DIAGNOSIS — H2511 Age-related nuclear cataract, right eye: Secondary | ICD-10-CM | POA: Diagnosis not present

## 2019-10-21 DIAGNOSIS — H401121 Primary open-angle glaucoma, left eye, mild stage: Secondary | ICD-10-CM | POA: Diagnosis not present

## 2019-10-21 DIAGNOSIS — H401122 Primary open-angle glaucoma, left eye, moderate stage: Secondary | ICD-10-CM | POA: Diagnosis not present

## 2019-10-21 DIAGNOSIS — H2512 Age-related nuclear cataract, left eye: Secondary | ICD-10-CM | POA: Diagnosis not present

## 2019-10-26 ENCOUNTER — Encounter: Payer: Self-pay | Admitting: Emergency Medicine

## 2019-10-26 ENCOUNTER — Other Ambulatory Visit: Payer: Self-pay | Admitting: Emergency Medicine

## 2019-10-26 DIAGNOSIS — I10 Essential (primary) hypertension: Secondary | ICD-10-CM

## 2019-11-04 DIAGNOSIS — H401112 Primary open-angle glaucoma, right eye, moderate stage: Secondary | ICD-10-CM | POA: Diagnosis not present

## 2019-11-04 DIAGNOSIS — H2511 Age-related nuclear cataract, right eye: Secondary | ICD-10-CM | POA: Diagnosis not present

## 2019-11-04 HISTORY — PX: EYE SURGERY: SHX253

## 2019-12-21 ENCOUNTER — Other Ambulatory Visit: Payer: Self-pay | Admitting: Emergency Medicine

## 2019-12-21 DIAGNOSIS — I10 Essential (primary) hypertension: Secondary | ICD-10-CM

## 2020-01-10 ENCOUNTER — Telehealth: Payer: Self-pay | Admitting: Emergency Medicine

## 2020-01-10 NOTE — Telephone Encounter (Signed)
Patient may have a hemorrhoid. Not itching but area swollen. Wants to know if she can use Preparation H or something else OTC that won't cause issues with her high b/p. Please advise at [phone]

## 2020-01-11 ENCOUNTER — Other Ambulatory Visit: Payer: Self-pay | Admitting: Emergency Medicine

## 2020-01-11 MED ORDER — HYDROCORTISONE (PERIANAL) 2.5 % EX CREA: 1.0000 "application " | TOPICAL_CREAM | Freq: Two times a day (BID) | CUTANEOUS | 0 refills | Status: DC

## 2020-01-11 NOTE — Telephone Encounter (Signed)
Prescription for Anusol [facility] sent.  Thanks.

## 2020-01-11 NOTE — Telephone Encounter (Signed)
Pt concerned may have Hemhrroid and is asking for recommendation something she can use that will not raise her BP? Please advise I planned to ask her for OV after holiday but sure she would like something to use in mean time thank you

## 2020-01-11 NOTE — Telephone Encounter (Signed)
Pt informed and will call back if not doing better

## 2020-01-13 ENCOUNTER — Telehealth: Payer: Self-pay | Admitting: Emergency Medicine

## 2020-01-13 NOTE — Telephone Encounter (Signed)
Patient called to ask if she can continue using the  hydrocortisone (ANUSOL-HC) 2.5 % rectal cream [[id number]]  For her hemorrhoid over the weekend. I spoke with the nurse Aram Beecham and confirmed it's ok to keep using it.

## 2020-01-17 ENCOUNTER — Telehealth: Payer: Self-pay | Admitting: Family Medicine

## 2020-01-17 NOTE — Telephone Encounter (Signed)
Called and told pt she was fine to use this till her app then we will see what is best on eval

## 2020-01-17 NOTE — Telephone Encounter (Signed)
Patient has appt tomorrow and wants to know if its ok to continue to use [employer] HC until tomorrow until her appt please advise

## 2020-01-18 ENCOUNTER — Other Ambulatory Visit: Payer: Self-pay

## 2020-01-18 ENCOUNTER — Ambulatory Visit (INDEPENDENT_AMBULATORY_CARE_PROVIDER_SITE_OTHER): Payer: PPO | Admitting: Family Medicine

## 2020-01-18 ENCOUNTER — Encounter: Payer: Self-pay | Admitting: Family Medicine

## 2020-01-18 VITALS — BP 158/74 | HR 89 | Temp 97.2°F | Ht 61.0 in | Wt 144.0 lb

## 2020-01-18 DIAGNOSIS — I10 Essential (primary) hypertension: Secondary | ICD-10-CM

## 2020-01-18 DIAGNOSIS — K649 Unspecified hemorrhoids: Secondary | ICD-10-CM

## 2020-01-18 MED ORDER — WITCH HAZEL-GLYCERIN EX PADS: 1.0000 "application " | MEDICATED_PAD | CUTANEOUS | 12 refills | Status: DC | PRN

## 2020-01-18 MED ORDER — HYDROCORTISONE ACETATE 25 MG RE SUPP: 25.0000 mg | Freq: Two times a day (BID) | RECTAL | 0 refills | Status: AC

## 2020-01-18 MED ORDER — HYDROCORTISONE (PERIANAL) 2.5 % EX CREA: 1.0000 "application " | TOPICAL_CREAM | Freq: Two times a day (BID) | CUTANEOUS | 0 refills | Status: DC

## 2020-01-18 MED ORDER — AMLODIPINE BESYLATE 10 MG PO TABS: 10.0000 mg | ORAL_TABLET | Freq: Every day | ORAL | 1 refills | Status: DC

## 2020-01-18 NOTE — Progress Notes (Signed)
12/28/20219:41 AM  Brenda [name] [birth year], 74 y.o., female [id number]  Chief Complaint  Patient presents with  . Hemorrhoids    X 2 weeks     HPI:   Patient is a 74 y.o. female with past medical history significant for HTN, HLD, pre-diabetes who presents today for hemorrhoids.   Hypertension Lisinopril/HCTZ: 20/12.5 Amlodipine 5 mg daily Crestor 10mg  daily Checks BP at home Goal< 130/80  BP Readings from Last 3 Encounters:  01/18/20 (!) 172/80  08/10/19 (!) 142/70  02/16/19 (!) 156/82   Wt Readings from Last 3 Encounters:  01/18/20 144 lb (65.3 kg)  08/10/19 150 lb 9.6 oz (68.3 kg)  03/22/19 158 lb (71.7 kg)   Noticed a bump on her rectum Has lost weight recently Not been eating as much Daily BM soft Per patient Wipes aggressively Hydrocortisone 2.5% rectal cream (Anusol HC) Been using this since 12/21 for bid No pain since then Last colonoscopy 08/31/12: moderate diverticulosis, no polys repeat 10 years Is wondering what procedures can be done to remove hemorrhoids   Depression screen [facility] 2/9 01/18/2020 08/10/2019 03/22/2019  Decreased Interest 0 0 0  Down, Depressed, Hopeless 0 0 0  PHQ - 2 Score 0 0 0    Fall Risk  01/18/2020 08/10/2019 03/22/2019 02/16/2019 02/11/2019  Falls in the past year? 0 0 0 0 0  Number falls in past yr: 0 - 0 0 0  Injury with Fall? 0 - 0 0 0  Follow up Falls evaluation completed Falls evaluation completed Falls evaluation completed Falls evaluation completed Falls evaluation completed;Education provided     No Known Allergies  Prior to Admission medications   Medication Sig Start Date End Date Taking? Authorizing Provider  amLODipine (NORVASC) 5 MG tablet Take 1 tablet by mouth once daily 12/21/19  Yes Sagardia, 12/23/19, MD  dorzolamide-timolol (COSOPT) 22.3-6.8 MG/ML ophthalmic solution  02/25/17  Yes [provider]  fexofenadine (ALLEGRA) 180 MG tablet Take 180 mg by mouth daily.   Yes [provider]   hydrocortisone (ANUSOL-HC) 2.5 % rectal cream Place 1 application rectally 2 (two) times daily. 01/11/20  Yes Sagardia, 01/13/20, MD  latanoprost (XALATAN) 0.005 % ophthalmic solution Place 1 drop into both eyes at bedtime. 05/02/16  Yes [provider]  lisinopril-hydrochlorothiazide (ZESTORETIC) 20-12.5 MG tablet Take 1 tablet by mouth once daily 10/26/19  Yes Sagardia, 12/26/19, MD  rosuvastatin (CRESTOR) 10 MG tablet Take 1 tablet (10 mg total) by mouth daily. 02/16/19  Yes 02/18/19, MD    Past Medical History:  Diagnosis Date  . Allergy    seasonal  . Chronic rhinitis 01/19/2007  . COLONIC POLYPS, HX OF 06/24/2006  . DIVERTICULOSIS, COLON 11/11/2006  . HYPERLIPIDEMIA 12/20/2008  . HYPERTENSION 06/24/2006  . Impaired glucose tolerance   . MENOPAUSAL SYNDROME 06/24/2006    Past Surgical History:  Procedure Laterality Date  . BREAST BIOPSY    . COLONOSCOPY    . EYE SURGERY Left 09/302021  . EYE SURGERY Right 11/04/2019  . POLYPECTOMY    . TUBAL LIGATION  1994   benign    Social History   Tobacco Use  . Smoking status: Never Smoker  . Smokeless tobacco: Never Used  Substance Use Topics  . Alcohol use: No    Family History  Problem Relation Age of Onset  . Mental illness Mother   . Cancer Father        lung  . Parkinsonism Brother   . Colon cancer  Neg Hx     Review of Systems  Constitutional: Negative for chills, fever and malaise/fatigue.  Respiratory: Negative for cough, shortness of breath and wheezing.   Cardiovascular: Negative for chest pain, palpitations and leg swelling.  Gastrointestinal: Negative for abdominal pain, blood in stool, constipation, diarrhea, heartburn, nausea and vomiting.       Bump near rectum  Genitourinary: Negative for dysuria, flank pain, frequency, hematuria and urgency.  Musculoskeletal: Negative for back pain.  Skin: Negative for itching and rash.  Neurological: Negative for dizziness, weakness and  headaches.     OBJECTIVE:  Today's Vitals   01/18/20 0848  BP: (!) 172/80  Pulse: 89  Temp: (!) 97.2 F (36.2 C)  SpO2: 99%  Weight: 144 lb (65.3 kg)  Height: 5\' 1"  (1.549 m)   Body mass index is 27.21 kg/m.   Physical Exam Constitutional:      General: She is not in acute distress.    Appearance: Normal appearance. She is not ill-appearing.  HENT:     Head: Normocephalic.  Cardiovascular:     Rate and Rhythm: Normal rate and regular rhythm.     Pulses: Normal pulses.     Heart sounds: Normal heart sounds. No murmur heard. No friction rub. No gallop.   Pulmonary:     Effort: Pulmonary effort is normal. No respiratory distress.     Breath sounds: Normal breath sounds. No stridor. No wheezing, rhonchi or rales.  Abdominal:     General: Bowel sounds are normal.     Palpations: Abdomen is soft.     Tenderness: There is no abdominal tenderness.  Genitourinary:    Comments: 2 cm diameter hemorrhoid noted to rectum entrance, smooth no bleeding or erythema noted. Musculoskeletal:     Right lower leg: No edema.     Left lower leg: No edema.  Skin:    General: Skin is warm and dry.  Neurological:     Mental Status: She is alert and oriented to person, place, and time.  Psychiatric:        Mood and Affect: Mood normal.        Behavior: Behavior normal.     No results found for this or any previous visit (from the past 24 hour(s)).  No results found.   ASSESSMENT and PLAN  Problem List Items Addressed This Visit      Cardiovascular and Mediastinum   Hypertension, essential   Relevant Medications   amLODipine (NORVASC) 10 MG tablet Increased from 5 mg daily Continue daily Lisinopirl/HCTZ Continue taking BP daily Will follow up in 2 weeks Goal BP < 130/80    Other Visit Diagnoses    Hemorrhoids, unspecified hemorrhoid type    -  Primary   Relevant Medications      hydrocortisone (ANUSOL-HC) 25 MG suppository   witch hazel-glycerin (TUCKS) pad    hydrocortisone (ANUSOL-HC) 2.5 % rectal cream   Other Relevant Orders   Ambulatory referral to Gastroenterology  Discussed r/se/b of medications Discussed RTC precautions Conservative treatment discussed      Return in about 2 weeks (around 02/01/2020) for Medication/BP follow up.  03/31/2020 Kiaya Haliburton, FNP-BC Primary Care at [facility] [address] [place], [place] Ph.  [phone] Fax [phone]

## 2020-01-18 NOTE — Patient Instructions (Addendum)
Take 2 5 mg Amlodipine pills per day for a total of 10 mg Your refill will be 10mg  tabs, once you pick them up you can take 1 per day  Continue with the Lisinopril/HCTZ combo medication  Your goal BP < 130/80 If in 2 weeks your BP is not at goal, send me a message on My chart   Twice a day for 2 weeks use the external steroid cream and the internal suppository The Tucks pads are for when you use   Hemorrhoids Hemorrhoids are swollen veins in and around the rectum or anus. There are two types of hemorrhoids:  Internal hemorrhoids. These occur in the veins that are just inside the rectum. They may poke through to the outside and become irritated and painful.  External hemorrhoids. These occur in the veins that are outside the anus and can be felt as a painful swelling or hard lump near the anus. Most hemorrhoids do not cause serious problems, and they can be managed with home treatments such as diet and lifestyle changes. If home treatments do not help the symptoms, procedures can be done to shrink or remove the hemorrhoids. What are the causes? This condition is caused by increased pressure in the anal area. This pressure may result from various things, including:  Constipation.  Straining to have a bowel movement.  Diarrhea.  Pregnancy.  Obesity.  Sitting for long periods of time.  Heavy lifting or other activity that causes you to strain.  Anal sex.  Riding a bike for a long period of time. What are the signs or symptoms? Symptoms of this condition include:  Pain.  Anal itching or irritation.  Rectal bleeding.  Leakage of stool (feces).  Anal swelling.  One or more lumps around the anus. How is this diagnosed? This condition can often be diagnosed through a visual exam. Other exams or tests may also be done, such as:  An exam that involves feeling the rectal area with a gloved hand (digital rectal exam).  An exam of the anal canal that is done using a small  tube (anoscope).  A blood test, if you have lost a significant amount of blood.  A test to look inside the colon using a flexible tube with a camera on the end (sigmoidoscopy or colonoscopy). How is this treated? This condition can usually be treated at home. However, various procedures may be done if dietary changes, lifestyle changes, and other home treatments do not help your symptoms. These procedures can help make the hemorrhoids smaller or remove them completely. Some of these procedures involve surgery, and others do not. Common procedures include:  Rubber band ligation. Rubber bands are placed at the base of the hemorrhoids to cut off their blood supply.  Sclerotherapy. Medicine is injected into the hemorrhoids to shrink them.  Infrared coagulation. A type of light energy is used to get rid of the hemorrhoids.  Hemorrhoidectomy surgery. The hemorrhoids are surgically removed, and the veins that supply them are tied off.  Stapled hemorrhoidopexy surgery. The surgeon staples the base of the hemorrhoid to the rectal wall. Follow these instructions at home: Eating and drinking   Eat foods that have a lot of fiber in them, such as whole grains, beans, nuts, fruits, and vegetables.  Ask your health care provider about taking products that have added fiber (fiber supplements).  Reduce the amount of fat in your diet. You can do this by eating low-fat dairy products, eating less red meat, and avoiding processed  foods.  Drink enough fluid to keep your urine pale yellow. Managing pain and swelling   Take warm sitz baths for 20 minutes, 3-4 times a day to ease pain and discomfort. You may do this in a bathtub or using a portable sitz bath that fits over the toilet.  If directed, apply ice to the affected area. Using ice packs between sitz baths may be helpful. ? Put ice in a plastic bag. ? Place a towel between your skin and the bag. ? Leave the ice on for 20 minutes, 2-3 times a  day. General instructions  Take over-the-counter and prescription medicines only as told by your health care provider.  Use medicated creams or suppositories as told.  Get regular exercise. Ask your health care provider how much and what kind of exercise is best for you. In general, you should do moderate exercise for at least 30 minutes on most days of the week (150 minutes each week). This can include activities such as walking, biking, or yoga.  Go to the bathroom when you have the urge to have a bowel movement. Do not wait.  Avoid straining to have bowel movements.  Keep the anal area dry and clean. Use wet toilet paper or moist towelettes after a bowel movement.  Do not sit on the toilet for long periods of time. This increases blood pooling and pain.  Keep all follow-up visits as told by your health care provider. This is important. Contact a health care provider if you have:  Increasing pain and swelling that are not controlled by treatment or medicine.  Difficulty having a bowel movement, or you are unable to have a bowel movement.  Pain or inflammation outside the area of the hemorrhoids. Get help right away if you have:  Uncontrolled bleeding from your rectum. Summary  Hemorrhoids are swollen veins in and around the rectum or anus.  Most hemorrhoids can be managed with home treatments such as diet and lifestyle changes.  Taking warm sitz baths can help ease pain and discomfort.  In severe cases, procedures or surgery can be done to shrink or remove the hemorrhoids. This information is not intended to replace advice given to you by your health care provider. Make sure you discuss any questions you have with your health care provider. Document Revised: 06/05/2018 Document Reviewed: 05/29/2017 Elsevier Patient Education  [employer].   If you have lab work done today you will be contacted with your lab results within the next 2 weeks.  If you have not heard  from [place] then please contact us. The fastest way to get your results is to register for My Chart.   IF you received an x-ray today, you will receive an invoice from [facility]. Please contact [facility] at [phone] with questions or concerns regarding your invoice.   IF you received labwork today, you will receive an invoice from [facility]. Please contact [facility] at [phone] with questions or concerns regarding your invoice.   Our billing staff will not be able to assist you with questions regarding bills from these companies.  You will be contacted with the lab results as soon as they are available. The fastest way to get your results is to activate your My Chart account. Instructions are located on the last page of this paperwork. If you have not heard from [place] regarding the results in 2 weeks, please contact this office.

## 2020-01-25 ENCOUNTER — Encounter: Payer: Self-pay | Admitting: Family Medicine

## 2020-01-27 ENCOUNTER — Other Ambulatory Visit: Payer: Self-pay | Admitting: Emergency Medicine

## 2020-01-27 ENCOUNTER — Telehealth: Payer: Self-pay

## 2020-01-27 DIAGNOSIS — E785 Hyperlipidemia, unspecified: Secondary | ICD-10-CM

## 2020-01-27 DIAGNOSIS — I10 Essential (primary) hypertension: Secondary | ICD-10-CM

## 2020-02-01 ENCOUNTER — Ambulatory Visit: Payer: PPO | Admitting: Family Medicine

## 2020-02-10 ENCOUNTER — Other Ambulatory Visit: Payer: Self-pay

## 2020-02-10 ENCOUNTER — Encounter: Payer: Self-pay | Admitting: Emergency Medicine

## 2020-02-10 ENCOUNTER — Ambulatory Visit (INDEPENDENT_AMBULATORY_CARE_PROVIDER_SITE_OTHER): Payer: PPO | Admitting: Emergency Medicine

## 2020-02-10 VITALS — BP 130/71 | HR 87 | Temp 97.2°F | Resp 16 | Ht 61.0 in | Wt 146.0 lb

## 2020-02-10 DIAGNOSIS — I10 Essential (primary) hypertension: Secondary | ICD-10-CM

## 2020-02-10 DIAGNOSIS — E785 Hyperlipidemia, unspecified: Secondary | ICD-10-CM

## 2020-02-10 DIAGNOSIS — R7303 Prediabetes: Secondary | ICD-10-CM | POA: Diagnosis not present

## 2020-02-10 LAB — COMPREHENSIVE METABOLIC PANEL
ALT: 15 IU/L (ref 0–32)
AST: 20 IU/L (ref 0–40)
Albumin/Globulin Ratio: 1.7 (ref 1.2–2.2)
Albumin: 4.3 g/dL (ref 3.7–4.7)
Alkaline Phosphatase: 67 IU/L (ref 44–121)
BUN/Creatinine Ratio: 14 (ref 12–28)
BUN: 12 mg/dL (ref 8–27)
Bilirubin Total: 0.5 mg/dL (ref 0.0–1.2)
CO2: 25 mmol/L (ref 20–29)
Calcium: 9.9 mg/dL (ref 8.7–10.3)
Chloride: 102 mmol/L (ref 96–106)
Creatinine, Ser: 0.86 mg/dL (ref 0.57–1.00)
GFR calc Af Amer: 77 mL/min/{1.73_m2} (ref >59)
GFR calc non Af Amer: 67 mL/min/{1.73_m2} (ref >59)
Globulin, Total: 2.5 g/dL (ref 1.5–4.5)
Glucose: 109 mg/dL — ABNORMAL HIGH (ref 65–99)
Potassium: 3.7 mmol/L (ref 3.5–5.2)
Sodium: 142 mmol/L (ref 134–144)
Total Protein: 6.8 g/dL (ref 6.0–8.5)

## 2020-02-10 LAB — HEMOGLOBIN A1C
Est. average glucose Bld gHb Est-mCnc: 128 mg/dL
Hgb A1c MFr Bld: 6.1 % — ABNORMAL HIGH (ref 4.8–5.6)

## 2020-02-10 LAB — LIPID PANEL
Chol/HDL Ratio: 2.5 ratio (ref 0.0–4.4)
Cholesterol, Total: 110 mg/dL (ref 100–199)
HDL: 44 mg/dL (ref >39)
LDL Chol Calc (NIH): 48 mg/dL (ref 0–99)
Triglycerides: 94 mg/dL (ref 0–149)
VLDL Cholesterol Cal: 18 mg/dL (ref 5–40)

## 2020-02-10 NOTE — Progress Notes (Signed)
Brenda [name] 75 y.o.   Chief Complaint  Patient presents with  . Hypertension    Follow up 6 months medical conditions    HISTORY OF PRESENT ILLNESS: This is a 75 y.o. female with history of hypertension here for follow-up. #1 hypertension on Zestoretic 20-12.5 mg.  Was recently seen in the office for hemorrhoids and amlodipine was increased to 10 mg daily. #2 prediabetes #3 dyslipidemia: On rosuvastatin 10 mg daily.  Last lipid profile last July was normal.  Not fasting today.  HPI   Prior to Admission medications   Medication Sig Start Date End Date Taking? Authorizing Provider  amLODipine (NORVASC) 10 MG tablet Take 1 tablet (10 mg total) by mouth daily. 01/18/20  Yes Just, Azalee Course, FNP  dorzolamide-timolol (COSOPT) 22.3-6.8 MG/ML ophthalmic solution  02/25/17  Yes [provider]  fexofenadine (ALLEGRA) 180 MG tablet Take 180 mg by mouth daily.   Yes [provider]  hydrocortisone (ANUSOL-HC) 2.5 % rectal cream Place 1 application rectally 2 (two) times daily. 01/18/20  Yes Just, Azalee Course, FNP  latanoprost (XALATAN) 0.005 % ophthalmic solution Place 1 drop into both eyes at bedtime. 05/02/16  Yes [provider]  lisinopril-hydrochlorothiazide (ZESTORETIC) 20-12.5 MG tablet Take 1 tablet by mouth once daily 01/27/20  Yes Ronique Simerly, Eilleen Kempf, MD  rosuvastatin (CRESTOR) 10 MG tablet Take 1 tablet by mouth once daily 01/27/20  Yes Dacian Orrico, Eilleen Kempf, MD  witch hazel-glycerin (TUCKS) pad Apply 1 application topically as needed for itching. 01/18/20  Yes Just, Azalee Course, FNP    No Known Allergies  Patient Active Problem List   Diagnosis Date Noted  . Prediabetes 02/16/2019  . Dyslipidemia 12/20/2008  . DIVERTICULOSIS, COLON 11/11/2006  . Hypertension, essential 06/24/2006  . MENOPAUSAL SYNDROME 06/24/2006  . COLONIC POLYPS, HX OF 06/24/2006    Past Medical History:  Diagnosis Date  . Allergy    seasonal  . Chronic rhinitis 01/19/2007  .  COLONIC POLYPS, HX OF 06/24/2006  . DIVERTICULOSIS, COLON 11/11/2006  . HYPERLIPIDEMIA 12/20/2008  . HYPERTENSION 06/24/2006  . Impaired glucose tolerance   . MENOPAUSAL SYNDROME 06/24/2006    Past Surgical History:  Procedure Laterality Date  . BREAST BIOPSY    . COLONOSCOPY    . EYE SURGERY Left 09/302021  . EYE SURGERY Right 11/04/2019  . POLYPECTOMY    . TUBAL LIGATION  1994   benign    Social History   Socioeconomic History  . Marital status: Married    Spouse name: Not on file  . Number of children: Not on file  . Years of education: Not on file  . Highest education level: Not on file  Occupational History  . Not on file  Tobacco Use  . Smoking status: Never Smoker  . Smokeless tobacco: Never Used  Vaping Use  . Vaping Use: Never used  Substance and Sexual Activity  . Alcohol use: No  . Drug use: No  . Sexual activity: Not on file  Other Topics Concern  . Not on file  Social History Narrative  . Not on file   Social Determinants of Health   Financial Resource Strain: Not on file  Food Insecurity: Not on file  Transportation Needs: Not on file  Physical Activity: Not on file  Stress: Not on file  Social Connections: Not on file  Intimate Partner Violence: Not on file    Family History  Problem Relation Age of Onset  . Mental illness Mother   . Cancer Father  lung  . Parkinsonism Brother   . Colon cancer Neg Hx      Review of Systems  Constitutional: Negative.  Negative for chills and fever.  HENT: Negative.  Negative for congestion and sore throat.   Respiratory: Negative.  Negative for cough and shortness of breath.   Cardiovascular: Negative.  Negative for chest pain and palpitations.  Gastrointestinal: Negative.  Negative for abdominal pain, diarrhea, nausea and vomiting.  Skin: Negative.  Negative for rash.  Neurological: Negative.  Negative for dizziness and headaches.  All other systems reviewed and are negative.   Today's Vitals    02/10/20 0823  BP: 130/71  Pulse: 87  Resp: 16  Temp: (!) 97.2 F (36.2 C)  TempSrc: Temporal  SpO2: 100%  Weight: 146 lb (66.2 kg)  Height: 5\' 1"  (1.549 m)   Body mass index is 27.59 kg/m. Wt Readings from Last 3 Encounters:  02/10/20 146 lb (66.2 kg)  01/18/20 144 lb (65.3 kg)  08/10/19 150 lb 9.6 oz (68.3 kg)    Physical Exam Vitals reviewed.  Constitutional:      Appearance: Normal appearance.  HENT:     Head: Normocephalic.  Eyes:     Extraocular Movements: Extraocular movements intact.     Pupils: Pupils are equal, round, and reactive to light.  Cardiovascular:     Rate and Rhythm: Normal rate and regular rhythm.     Pulses: Normal pulses.     Heart sounds: Normal heart sounds.  Pulmonary:     Effort: Pulmonary effort is normal.     Breath sounds: Normal breath sounds.  Musculoskeletal:        General: Normal range of motion.     Cervical back: Normal range of motion. No tenderness.  Lymphadenopathy:     Cervical: No cervical adenopathy.  Skin:    General: Skin is warm and dry.     Capillary Refill: Capillary refill takes less than 2 seconds.  Neurological:     General: No focal deficit present.     Mental Status: She is alert and oriented to person, place, and time.  Psychiatric:        Mood and Affect: Mood normal.        Behavior: Behavior normal.      ASSESSMENT & PLAN: Hypertension, essential Well-controlled hypertension.  Continue present medications.  No changes.  Dyslipidemia Normal last lipid profile.  Not fasting today.  Taking rosuvastatin 10 mg daily.  Prediabetes Diet and nutrition discussed.  Hemoglobin A1c done today.  Jaylena was seen today for hypertension.  Diagnoses and all orders for this visit:  Hypertension, essential -     Comprehensive metabolic panel  Prediabetes -     Hemoglobin A1c  Dyslipidemia -     Lipid panel    Patient Instructions       If you have lab work done today you will be contacted with  your lab results within the next 2 weeks.  If you have not heard from 08/12/19 then please contact us. The fastest way to get your results is to register for My Chart.   IF you received an x-ray today, you will receive an invoice from [facility]. Please contact [facility] at [phone] with questions or concerns regarding your invoice.   IF you received labwork today, you will receive an invoice from [facility]. Please contact [facility] at [phone] with questions or concerns regarding your invoice.   Our billing staff will not be able to assist you with  questions regarding bills from these companies.  You will be contacted with the lab results as soon as they are available. The fastest way to get your results is to activate your My Chart account. Instructions are located on the last page of this paperwork. If you have not heard from us regarding the results in 2 weeks, please contact this office.     Health Maintenance After Age 75 After age 75, you are at a higher risk for certain long-term diseases and infections as well as injuries from falls. Falls are a major cause of broken bones and head injuries in people who are older than age 75. Getting regular preventive care can help to keep you healthy and well. Preventive care includes getting regular testing and making lifestyle changes as recommended by your health care provider. Talk with your health care provider about:  Which screenings and tests you should have. A screening is a test that checks for a disease when you have no symptoms.  A diet and exercise plan that is right for you. What should I know about screenings and tests to prevent falls? Screening and testing are the best ways to find a health problem early. Early diagnosis and treatment give you the best chance of managing medical conditions that are common after age 75. Certain conditions and lifestyle choices may make you more likely to have a fall. Your health  care provider may recommend:  Regular vision checks. Poor vision and conditions such as cataracts can make you more likely to have a fall. If you wear glasses, make sure to get your prescription updated if your vision changes.  Medicine review. Work with your health care provider to regularly review all of the medicines you are taking, including over-the-counter medicines. Ask your health care provider about any side effects that may make you more likely to have a fall. Tell your health care provider if any medicines that you take make you feel dizzy or sleepy.  Osteoporosis screening. Osteoporosis is a condition that causes the bones to get weaker. This can make the bones weak and cause them to break more easily.  Blood pressure screening. Blood pressure changes and medicines to control blood pressure can make you feel dizzy.  Strength and balance checks. Your health care provider may recommend certain tests to check your strength and balance while standing, walking, or changing positions.  Foot health exam. Foot pain and numbness, as well as not wearing proper footwear, can make you more likely to have a fall.  Depression screening. You may be more likely to have a fall if you have a fear of falling, feel emotionally low, or feel unable to do activities that you used to do.  Alcohol use screening. Using too much alcohol can affect your balance and may make you more likely to have a fall. What actions can I take to lower my risk of falls? General instructions  Talk with your health care provider about your risks for falling. Tell your health care provider if: ? You fall. Be sure to tell your health care provider about all falls, even ones that seem minor. ? You feel dizzy, sleepy, or off-balance.  Take over-the-counter and prescription medicines only as told by your health care provider. These include any supplements.  Eat a healthy diet and maintain a healthy weight. A healthy diet  includes low-fat dairy products, low-fat (lean) meats, and fiber from whole grains, beans, and lots of fruits and vegetables. Home safety  Remove any  tripping hazards, such as rugs, cords, and clutter.  Install safety equipment such as grab bars in bathrooms and safety rails on stairs.  Keep rooms and walkways well-lit. Activity  Follow a regular exercise program to stay fit. This will help you maintain your balance. Ask your health care provider what types of exercise are appropriate for you.  If you need a cane or walker, use it as recommended by your health care provider.  Wear supportive shoes that have nonskid soles.   Lifestyle  Do not drink alcohol if your health care provider tells you not to drink.  If you drink alcohol, limit how much you have: ? 0-1 drink a day for women. ? 0-2 drinks a day for men.  Be aware of how much alcohol is in your drink. In the [place] one drink equals one typical bottle of beer (12 oz), one-half glass of wine (5 oz), or one shot of hard liquor (1 oz).  Do not use any products that contain nicotine or tobacco, such as cigarettes and e-cigarettes. If you need help quitting, ask your health care provider. Summary  Having a healthy lifestyle and getting preventive care can help to protect your health and wellness after age 35.  Screening and testing are the best way to find a health problem early and help you avoid having a fall. Early diagnosis and treatment give you the best chance for managing medical conditions that are more common for people who are older than age 26.  Falls are a major cause of broken bones and head injuries in people who are older than age 29. Take precautions to prevent a fall at home.  Work with your health care provider to learn what changes you can make to improve your health and wellness and to prevent falls. This information is not intended to replace advice given to you by your health care provider. Make sure you  discuss any questions you have with your health care provider. Document Revised: 04/30/2018 Document Reviewed: 11/20/2016 Elsevier Patient Education  2021 Elsevier Inc.      Edwina Barth, MD [facility]

## 2020-02-10 NOTE — Assessment & Plan Note (Signed)
Well-controlled hypertension. Continue present medications.  No changes. 

## 2020-02-10 NOTE — Assessment & Plan Note (Signed)
Normal last lipid profile.  Not fasting today.  Taking rosuvastatin 10 mg daily.

## 2020-02-10 NOTE — Assessment & Plan Note (Signed)
Diet and nutrition discussed.  Hemoglobin A1c done today. 

## 2020-02-10 NOTE — Patient Instructions (Addendum)
   If you have lab work done today you will be contacted with your lab results within the next 2 weeks.  If you have not heard from us then please contact us. The fastest way to get your results is to register for My Chart.   IF you received an x-ray today, you will receive an invoice from [facility]. Please contact  Radiology at [phone] with questions or concerns regarding your invoice.   IF you received labwork today, you will receive an invoice from [facility]. Please contact [facility] at [phone] with questions or concerns regarding your invoice.   Our billing staff will not be able to assist you with questions regarding bills from these companies.  You will be contacted with the lab results as soon as they are available. The fastest way to get your results is to activate your My Chart account. Instructions are located on the last page of this paperwork. If you have not heard from us regarding the results in 2 weeks, please contact this office.       Health Maintenance After Age 65 After age 65, you are at a higher risk for certain long-term diseases and infections as well as injuries from falls. Falls are a major cause of broken bones and head injuries in people who are older than age 65. Getting regular preventive care can help to keep you healthy and well. Preventive care includes getting regular testing and making lifestyle changes as recommended by your health care provider. Talk with your health care provider about:  Which screenings and tests you should have. A screening is a test that checks for a disease when you have no symptoms.  A diet and exercise plan that is right for you. What should I know about screenings and tests to prevent falls? Screening and testing are the best ways to find a health problem early. Early diagnosis and treatment give you the best chance of managing medical conditions that are common after age 65. Certain conditions and  lifestyle choices may make you more likely to have a fall. Your health care provider may recommend:  Regular vision checks. Poor vision and conditions such as cataracts can make you more likely to have a fall. If you wear glasses, make sure to get your prescription updated if your vision changes.  Medicine review. Work with your health care provider to regularly review all of the medicines you are taking, including over-the-counter medicines. Ask your health care provider about any side effects that may make you more likely to have a fall. Tell your health care provider if any medicines that you take make you feel dizzy or sleepy.  Osteoporosis screening. Osteoporosis is a condition that causes the bones to get weaker. This can make the bones weak and cause them to break more easily.  Blood pressure screening. Blood pressure changes and medicines to control blood pressure can make you feel dizzy.  Strength and balance checks. Your health care provider may recommend certain tests to check your strength and balance while standing, walking, or changing positions.  Foot health exam. Foot pain and numbness, as well as not wearing proper footwear, can make you more likely to have a fall.  Depression screening. You may be more likely to have a fall if you have a fear of falling, feel emotionally low, or feel unable to do activities that you used to do.  Alcohol use screening. Using too much alcohol can affect your balance and may make you more   likely to have a fall. What actions can I take to lower my risk of falls? General instructions  Talk with your health care provider about your risks for falling. Tell your health care provider if: ? You fall. Be sure to tell your health care provider about all falls, even ones that seem minor. ? You feel dizzy, sleepy, or off-balance.  Take over-the-counter and prescription medicines only as told by your health care provider. These include any  supplements.  Eat a healthy diet and maintain a healthy weight. A healthy diet includes low-fat dairy products, low-fat (lean) meats, and fiber from whole grains, beans, and lots of fruits and vegetables. Home safety  Remove any tripping hazards, such as rugs, cords, and clutter.  Install safety equipment such as grab bars in bathrooms and safety rails on stairs.  Keep rooms and walkways well-lit. Activity  Follow a regular exercise program to stay fit. This will help you maintain your balance. Ask your health care provider what types of exercise are appropriate for you.  If you need a cane or walker, use it as recommended by your health care provider.  Wear supportive shoes that have nonskid soles.   Lifestyle  Do not drink alcohol if your health care provider tells you not to drink.  If you drink alcohol, limit how much you have: ? 0-1 drink a day for women. ? 0-2 drinks a day for men.  Be aware of how much alcohol is in your drink. In the [place] one drink equals one typical bottle of beer (12 oz), one-half glass of wine (5 oz), or one shot of hard liquor (1 oz).  Do not use any products that contain nicotine or tobacco, such as cigarettes and e-cigarettes. If you need help quitting, ask your health care provider. Summary  Having a healthy lifestyle and getting preventive care can help to protect your health and wellness after age 65.  Screening and testing are the best way to find a health problem early and help you avoid having a fall. Early diagnosis and treatment give you the best chance for managing medical conditions that are more common for people who are older than age 65.  Falls are a major cause of broken bones and head injuries in people who are older than age 65. Take precautions to prevent a fall at home.  Work with your health care provider to learn what changes you can make to improve your health and wellness and to prevent falls. This information is not intended  to replace advice given to you by your health care provider. Make sure you discuss any questions you have with your health care provider. Document Revised: 04/30/2018 Document Reviewed: 11/20/2016 Elsevier Patient Education  2021 Elsevier Inc.  

## 2020-04-11 ENCOUNTER — Telehealth: Payer: Self-pay | Admitting: Emergency Medicine

## 2020-04-11 NOTE — Telephone Encounter (Signed)
Patient is concerned because she is concerned that her blood pressur medicine may have a recall from[employer]   lisinopril-hydrochlorothiazide (ZESTORETIC) 20-12.5 MG tablet      Does he need to change this ?   Please advise

## 2020-04-12 NOTE — Telephone Encounter (Signed)
Patient informed per previous notes

## 2020-04-12 NOTE — Telephone Encounter (Signed)
No need. Thanks

## 2020-04-13 ENCOUNTER — Other Ambulatory Visit: Payer: Self-pay | Admitting: Emergency Medicine

## 2020-04-13 DIAGNOSIS — E785 Hyperlipidemia, unspecified: Secondary | ICD-10-CM

## 2020-04-13 DIAGNOSIS — I10 Essential (primary) hypertension: Secondary | ICD-10-CM

## 2020-04-13 NOTE — Telephone Encounter (Signed)
Requested Prescriptions  Pending Prescriptions Disp Refills  . lisinopril-hydrochlorothiazide (ZESTORETIC) 20-12.5 MG tablet [Pharmacy Med Name: Lisinopril-hydroCHLOROthiazide 20-12.5 MG Oral Tablet] 90 tablet 0    Sig: Take 1 tablet by mouth once daily     Cardiovascular:  ACEI + Diuretic Combos Passed - 04/13/2020  8:19 AM      Passed - Na in normal range and within 180 days    Sodium  Date Value Ref Range Status  02/10/2020 142 134 - 144 mmol/L Final         Passed - K in normal range and within 180 days    Potassium  Date Value Ref Range Status  02/10/2020 3.7 3.5 - 5.2 mmol/L Final         Passed - Cr in normal range and within 180 days    Creatinine, Ser  Date Value Ref Range Status  02/10/2020 0.86 0.57 - 1.00 mg/dL Final   Creatinine,U  Date Value Ref Range Status  03/31/2017 38.4 mg/dL Final         Passed - Ca in normal range and within 180 days    Calcium  Date Value Ref Range Status  02/10/2020 9.9 8.7 - 10.3 mg/dL Final         Passed - Patient is not pregnant      Passed - Last BP in normal range    BP Readings from Last 1 Encounters:  02/10/20 130/71         Passed - Valid encounter within last 6 months    Recent Outpatient Visits          2 months ago Hypertension, essential   Primary Care at [facility], Eilleen Kempf, MD   2 months ago Hemorrhoids, unspecified hemorrhoid type   [facility] at [facility], Azalee Course, FNP   8 months ago Hypertension, essential   Primary Care at [facility], [place], MD   1 year ago Advice given about COVID-19 virus infection   Primary Care at [facility], [place], MD   1 year ago Hyperkalemia   Primary Care at [facility], Meda Coffee, MD             . rosuvastatin (CRESTOR) 10 MG tablet [Pharmacy Med Name: Rosuvastatin Calcium 10 MG Oral Tablet] 90 tablet 0    Sig: Take 1 tablet by mouth once daily     Cardiovascular:  Antilipid - Statins Failed - 04/13/2020  8:19 AM      Failed -  LDL in normal range and within 360 days    LDL Chol Calc (NIH)  Date Value Ref Range Status  02/10/2020 48 0 - 99 mg/dL Final   Direct LDL  Date Value Ref Range Status  04/11/2014 181.0 mg/dL Final    Comment:    Optimal:  <100 mg/dLNear or Above Optimal:  100-129 mg/dLBorderline High:  130-159 mg/dLHigh:  160-189 mg/dLVery High:  >190 mg/dL         Passed - Total Cholesterol in normal range and within 360 days    Cholesterol, Total  Date Value Ref Range Status  02/10/2020 110 100 - 199 mg/dL Final         Passed - HDL in normal range and within 360 days    HDL  Date Value Ref Range Status  02/10/2020 44 >39 mg/dL Final         Passed - Triglycerides in normal range and within 360 days    Triglycerides  Date Value Ref  Range Status  02/10/2020 94 0 - 149 mg/dL Final         Passed - Patient is not pregnant      Passed - Valid encounter within last 12 months    Recent Outpatient Visits          2 months ago Hypertension, essential   Primary Care at [facility], [place], MD   2 months ago Hemorrhoids, unspecified hemorrhoid type   Primary Care at [facility], Azalee Course, FNP   8 months ago Hypertension, essential   Primary Care at [facility], Eilleen Kempf, MD   1 year ago Advice given about COVID-19 virus infection   Primary Care at [facility], Eilleen Kempf, MD   1 year ago Hyperkalemia   Primary Care at [facility], Meda Coffee, MD

## 2020-05-29 DIAGNOSIS — I10 Essential (primary) hypertension: Secondary | ICD-10-CM | POA: Diagnosis not present

## 2020-05-29 DIAGNOSIS — Z961 Presence of intraocular lens: Secondary | ICD-10-CM | POA: Diagnosis not present

## 2020-05-29 DIAGNOSIS — H401112 Primary open-angle glaucoma, right eye, moderate stage: Secondary | ICD-10-CM | POA: Diagnosis not present

## 2020-05-29 DIAGNOSIS — H0289 Other specified disorders of eyelid: Secondary | ICD-10-CM | POA: Diagnosis not present

## 2020-07-05 DIAGNOSIS — Z1231 Encounter for screening mammogram for malignant neoplasm of breast: Secondary | ICD-10-CM | POA: Diagnosis not present

## 2020-07-05 LAB — HM MAMMOGRAPHY

## 2020-07-31 ENCOUNTER — Telehealth: Payer: Self-pay | Admitting: Emergency Medicine

## 2020-07-31 DIAGNOSIS — E785 Hyperlipidemia, unspecified: Secondary | ICD-10-CM

## 2020-07-31 DIAGNOSIS — I10 Essential (primary) hypertension: Secondary | ICD-10-CM

## 2020-08-01 ENCOUNTER — Encounter: Payer: Self-pay | Admitting: Emergency Medicine

## 2020-08-01 MED ORDER — AMLODIPINE BESYLATE 10 MG PO TABS: 10.0000 mg | ORAL_TABLET | Freq: Every day | ORAL | 1 refills | Status: DC

## 2020-08-01 NOTE — Telephone Encounter (Signed)
1.Medication Requested: amLODipine (NORVASC) 10 MG tablet   2. Pharmacy (Name, Street, [place]): [facility] - [place], [place] - [address]  3. On Med List: yes   4. Last Visit with PCP: 02-10-20  5. Next visit date with PCP: 11-01-20   Agent: Please be advised that RX refills may take up to 3 business days. We ask that you follow-up with your pharmacy.

## 2020-08-02 ENCOUNTER — Encounter: Payer: Self-pay | Admitting: Emergency Medicine

## 2020-09-29 ENCOUNTER — Encounter: Payer: Self-pay | Admitting: Emergency Medicine

## 2020-10-31 ENCOUNTER — Telehealth: Payer: Self-pay | Admitting: Emergency Medicine

## 2020-10-31 ENCOUNTER — Other Ambulatory Visit: Payer: Self-pay | Admitting: Emergency Medicine

## 2020-10-31 DIAGNOSIS — I10 Essential (primary) hypertension: Secondary | ICD-10-CM

## 2020-10-31 DIAGNOSIS — E785 Hyperlipidemia, unspecified: Secondary | ICD-10-CM

## 2020-10-31 NOTE — Telephone Encounter (Signed)
1.Medication Requested: amLODipine (NORVASC) 10 MG tablet  2. Pharmacy (Name, Street, [place]): [facility] [place], [place] - [address]  Phone:  [phone] Fax:  [phone]   3. On Med List: yes  4. Last Visit with PCP: 01.20.22  5. Next visit date with PCP: 10.12.22   Agent: Please be advised that RX refills may take up to 3 business days. We ask that you follow-up with your pharmacy.

## 2020-11-01 ENCOUNTER — Encounter: Payer: Self-pay | Admitting: Emergency Medicine

## 2020-11-01 ENCOUNTER — Ambulatory Visit (INDEPENDENT_AMBULATORY_CARE_PROVIDER_SITE_OTHER): Payer: PPO | Admitting: Emergency Medicine

## 2020-11-01 ENCOUNTER — Other Ambulatory Visit: Payer: Self-pay

## 2020-11-01 VITALS — BP 136/78 | HR 68 | Temp 98.4°F | Ht 61.0 in | Wt 150.0 lb

## 2020-11-01 DIAGNOSIS — E785 Hyperlipidemia, unspecified: Secondary | ICD-10-CM | POA: Diagnosis not present

## 2020-11-01 DIAGNOSIS — I10 Essential (primary) hypertension: Secondary | ICD-10-CM | POA: Diagnosis not present

## 2020-11-01 DIAGNOSIS — R7303 Prediabetes: Secondary | ICD-10-CM

## 2020-11-01 DIAGNOSIS — K5730 Diverticulosis of large intestine without perforation or abscess without bleeding: Secondary | ICD-10-CM | POA: Diagnosis not present

## 2020-11-01 LAB — COMPREHENSIVE METABOLIC PANEL
ALT: 15 U/L (ref 0–35)
AST: 21 U/L (ref 0–37)
Albumin: 4.4 g/dL (ref 3.5–5.2)
Alkaline Phosphatase: 63 U/L (ref 39–117)
BUN: 12 mg/dL (ref 6–23)
CO2: 32 mEq/L (ref 19–32)
Calcium: 10.1 mg/dL (ref 8.4–10.5)
Chloride: 103 mEq/L (ref 96–112)
Creatinine, Ser: 0.76 mg/dL (ref 0.40–1.20)
GFR: 76.7 mL/min (ref >60.00)
Glucose, Bld: 120 mg/dL — ABNORMAL HIGH (ref 70–99)
Potassium: 3.7 mEq/L (ref 3.5–5.1)
Sodium: 141 mEq/L (ref 135–145)
Total Bilirubin: 0.9 mg/dL (ref 0.2–1.2)
Total Protein: 7.2 g/dL (ref 6.0–8.3)

## 2020-11-01 LAB — LIPID PANEL
Cholesterol: 111 mg/dL (ref 0–200)
HDL: 45.9 mg/dL (ref >39.00)
LDL Cholesterol: 47 mg/dL (ref 0–99)
NonHDL: 64.66
Total CHOL/HDL Ratio: 2
Triglycerides: 89 mg/dL (ref 0.0–149.0)
VLDL: 17.8 mg/dL (ref 0.0–40.0)

## 2020-11-01 LAB — HEMOGLOBIN A1C: Hgb A1c MFr Bld: 6.5 % (ref 4.6–6.5)

## 2020-11-01 MED ORDER — AMLODIPINE BESYLATE 10 MG PO TABS: 10.0000 mg | ORAL_TABLET | Freq: Every day | ORAL | 1 refills | Status: DC

## 2020-11-01 NOTE — Assessment & Plan Note (Signed)
Diet and nutrition discussed.  Advised to decrease amount of daily carbohydrate intake.  Eating much better. Lab Results  Component Value Date   HGBA1C 6.1 (H) 02/10/2020

## 2020-11-01 NOTE — Assessment & Plan Note (Signed)
Well-controlled hypertension.  Continue amlodipine 10 mg and Zestoretic 20-12.5 mg daily.  Dietary approaches to stop hypertension discussed. BP Readings from Last 3 Encounters:  11/01/20 136/78  02/10/20 130/71  01/18/20 (!) 158/74

## 2020-11-01 NOTE — Patient Instructions (Signed)

## 2020-11-01 NOTE — Assessment & Plan Note (Signed)
No complications.  Advised to follow a high-fiber diet.

## 2020-11-01 NOTE — Progress Notes (Signed)
Lab Results  Component Value Date   HGBA1C 6.1 (H) 02/10/2020   BP Readings from Last 3 Encounters:  02/10/20 130/71  01/18/20 (!) 158/74  08/10/19 (!) 142/70   Lab Results  Component Value Date   CREATININE 0.86 02/10/2020   BUN 12 02/10/2020   NA 142 02/10/2020   K 3.7 02/10/2020   CL 102 02/10/2020   CO2 25 02/10/2020   Lab Results  Component Value Date   CHOL 110 02/10/2020   HDL 44 02/10/2020   LDLCALC 48 02/10/2020   LDLDIRECT 181.0 04/11/2014   TRIG 94 02/10/2020   CHOLHDL 2.5 02/10/2020  Brenda [name] 75 y.o.   Chief Complaint  Patient presents with   Hypertension    F/u    HISTORY OF PRESENT ILLNESS: This is a 75 y.o. female here for follow-up of hypertension, dyslipidemia and prediabetes. Doing well.  Has no complaints or medical concerns today.  Hypertension Pertinent negatives include no chest pain, headaches, palpitations or shortness of breath.    Prior to Admission medications   Medication Sig Start Date End Date Taking? Authorizing Provider  amLODipine (NORVASC) 10 MG tablet Take 1 tablet (10 mg total) by mouth daily. 08/01/20  Yes Georgina Quint, MD  dorzolamide-timolol (COSOPT) 22.3-6.8 MG/ML ophthalmic solution  02/25/17  Yes [provider]  fexofenadine (ALLEGRA) 180 MG tablet Take 180 mg by mouth daily.   Yes [provider]  latanoprost (XALATAN) 0.005 % ophthalmic solution Place 1 drop into both eyes at bedtime. 05/02/16  Yes [provider]  lisinopril-hydrochlorothiazide (ZESTORETIC) 20-12.5 MG tablet Take 1 tablet by mouth once daily 10/31/20  Yes Jamielynn Wigley, Eilleen Kempf, MD  rosuvastatin (CRESTOR) 10 MG tablet Take 1 tablet by mouth once daily 10/31/20  Yes Clent Damore, Eilleen Kempf, MD  FLUZONE HIGH-DOSE QUADRIVALENT 0.7 ML SUSY  09/29/20   [provider]    No Known Allergies  Patient Active Problem List   Diagnosis Date Noted   Prediabetes 02/16/2019   Dyslipidemia 12/20/2008   Diverticulosis  of colon 11/11/2006   Hypertension, essential 06/24/2006   COLONIC POLYPS, HX OF 06/24/2006    Past Medical History:  Diagnosis Date   Allergy    seasonal   Chronic rhinitis 01/19/2007   COLONIC POLYPS, HX OF 06/24/2006   DIVERTICULOSIS, COLON 11/11/2006   HYPERLIPIDEMIA 12/20/2008   HYPERTENSION 06/24/2006   Impaired glucose tolerance    MENOPAUSAL SYNDROME 06/24/2006    Past Surgical History:  Procedure Laterality Date   BREAST BIOPSY     COLONOSCOPY     EYE SURGERY Left 09/302021   EYE SURGERY Right 11/04/2019   POLYPECTOMY     TUBAL LIGATION  1994   benign    Social History   Socioeconomic History   Marital status: Married    Spouse name: Not on file   Number of children: Not on file   Years of education: Not on file   Highest education level: Not on file  Occupational History   Not on file  Tobacco Use   Smoking status: Never   Smokeless tobacco: Never  Vaping Use   Vaping Use: Never used  Substance and Sexual Activity   Alcohol use: No   Drug use: No   Sexual activity: Not on file  Other Topics Concern   Not on file  Social History Narrative   Not on file   Social Determinants of Health   Financial Resource Strain: Not on file  Food Insecurity: Not on file  Transportation  Needs: Not on file  Physical Activity: Not on file  Stress: Not on file  Social Connections: Not on file  Intimate Partner Violence: Not on file    Family History  Problem Relation Age of Onset   Mental illness Mother    Cancer Father        lung   Parkinsonism Brother    Colon cancer Neg Hx      Review of Systems  Constitutional: Negative.  Negative for chills and fever.  HENT: Negative.  Negative for congestion and sore throat.   Respiratory: Negative.  Negative for cough and shortness of breath.   Cardiovascular: Negative.  Negative for chest pain and palpitations.  Gastrointestinal:  Negative for abdominal pain, diarrhea, nausea and vomiting.  Genitourinary:  Negative.  Negative for dysuria and hematuria.  Skin: Negative.  Negative for rash.  Neurological:  Negative for dizziness and headaches.  All other systems reviewed and are negative.  Vitals:   11/01/20 0822  BP: 136/78  Pulse: 68  Temp: 98.4 F (36.9 C)  SpO2: 98%   Wt Readings from Last 3 Encounters:  11/01/20 150 lb (68 kg)  02/10/20 146 lb (66.2 kg)  01/18/20 144 lb (65.3 kg)    Physical Exam Vitals reviewed.  Constitutional:      Appearance: Normal appearance.  HENT:     Head: Normocephalic.  Eyes:     Extraocular Movements: Extraocular movements intact.     Conjunctiva/sclera: Conjunctivae normal.     Pupils: Pupils are equal, round, and reactive to light.  Cardiovascular:     Rate and Rhythm: Normal rate and regular rhythm.     Pulses: Normal pulses.     Heart sounds: Normal heart sounds.  Pulmonary:     Effort: Pulmonary effort is normal.     Breath sounds: Normal breath sounds.  Musculoskeletal:     Cervical back: Normal range of motion and neck supple.     Right lower leg: No edema.     Left lower leg: No edema.  Skin:    General: Skin is warm and dry.     Capillary Refill: Capillary refill takes less than 2 seconds.  Neurological:     General: No focal deficit present.     Mental Status: She is alert and oriented to person, place, and time.  Psychiatric:        Mood and Affect: Mood normal.        Behavior: Behavior normal.     ASSESSMENT & PLAN: Problem List Items Addressed This Visit       Cardiovascular and Mediastinum   Hypertension, essential - Primary    Well-controlled hypertension.  Continue amlodipine 10 mg and Zestoretic 20-12.5 mg daily.  Dietary approaches to stop hypertension discussed. BP Readings from Last 3 Encounters:  11/01/20 136/78  02/10/20 130/71  01/18/20 (!) 158/74         Relevant Orders   Comprehensive metabolic panel     Digestive   Diverticulosis of colon    No complications.  Advised to follow a  high-fiber diet.        Other   Dyslipidemia    Stable.  Diet and nutrition discussed.  Continue rosuvastatin 10 mg daily.       Relevant Orders   Lipid panel   Prediabetes    Diet and nutrition discussed.  Advised to decrease amount of daily carbohydrate intake.  Eating much better. Lab Results  Component Value Date   HGBA1C 6.1 (H) 02/10/2020  Relevant Orders   Hemoglobin A1c   Patient Instructions  Hypertension, Adult High blood pressure (hypertension) is when the force of blood pumping through the arteries is too strong. The arteries are the blood vessels that carry blood from the heart throughout the body. Hypertension forces the heart to work harder to pump blood and may cause arteries to become narrow or stiff. Untreated or uncontrolled hypertension can cause a heart attack, heart failure, a stroke, kidney disease, and other problems. A blood pressure reading consists of a higher number over a lower number. Ideally, your blood pressure should be below 120/80. The first ("top") number is called the systolic pressure. It is a measure of the pressure in your arteries as your heart beats. The second ("bottom") number is called the diastolic pressure. It is a measure of the pressure in your arteries as the heart relaxes. What are the causes? The exact cause of this condition is not known. There are some conditions that result in or are related to high blood pressure. What increases the risk? Some risk factors for high blood pressure are under your control. The following factors may make you more likely to develop this condition: Smoking. Having type 2 diabetes mellitus, high cholesterol, or both. Not getting enough exercise or physical activity. Being overweight. Having too much fat, sugar, calories, or salt (sodium) in your diet. Drinking too much alcohol. Some risk factors for high blood pressure may be difficult or impossible to change. Some of these factors  include: Having chronic kidney disease. Having a family history of high blood pressure. Age. Risk increases with age. Race. You may be at higher risk if you are African American. Gender. Men are at higher risk than women before age 61. After age 57, women are at higher risk than men. Having obstructive sleep apnea. Stress. What are the signs or symptoms? High blood pressure may not cause symptoms. Very high blood pressure (hypertensive crisis) may cause: Headache. Anxiety. Shortness of breath. Nosebleed. Nausea and vomiting. Vision changes. Severe chest pain. Seizures. How is this diagnosed? This condition is diagnosed by measuring your blood pressure while you are seated, with your arm resting on a flat surface, your legs uncrossed, and your feet flat on the floor. The cuff of the blood pressure monitor will be placed directly against the skin of your upper arm at the level of your heart. It should be measured at least twice using the same arm. Certain conditions can cause a difference in blood pressure between your right and left arms. Certain factors can cause blood pressure readings to be lower or higher than normal for a short period of time: When your blood pressure is higher when you are in a health care provider's office than when you are at home, this is called white coat hypertension. Most people with this condition do not need medicines. When your blood pressure is higher at home than when you are in a health care provider's office, this is called masked hypertension. Most people with this condition may need medicines to control blood pressure. If you have a high blood pressure reading during one visit or you have normal blood pressure with other risk factors, you may be asked to: Return on a different day to have your blood pressure checked again. Monitor your blood pressure at home for 1 week or longer. If you are diagnosed with hypertension, you may have other blood or imaging  tests to help your health care provider understand your overall risk for  other conditions. How is this treated? This condition is treated by making healthy lifestyle changes, such as eating healthy foods, exercising more, and reducing your alcohol intake. Your health care provider may prescribe medicine if lifestyle changes are not enough to get your blood pressure under control, and if: Your systolic blood pressure is above 130. Your diastolic blood pressure is above 80. Your personal target blood pressure may vary depending on your medical conditions, your age, and other factors. Follow these instructions at home: Eating and drinking  Eat a diet that is high in fiber and potassium, and low in sodium, added sugar, and fat. An example eating plan is called the DASH (Dietary Approaches to Stop Hypertension) diet. To eat this way: Eat plenty of fresh fruits and vegetables. Try to fill one half of your plate at each meal with fruits and vegetables. Eat whole grains, such as whole-wheat pasta, brown rice, or whole-grain bread. Fill about one fourth of your plate with whole grains. Eat or drink low-fat dairy products, such as skim milk or low-fat yogurt. Avoid fatty cuts of meat, processed or cured meats, and poultry with skin. Fill about one fourth of your plate with lean proteins, such as fish, chicken without skin, beans, eggs, or tofu. Avoid pre-made and processed foods. These tend to be higher in sodium, added sugar, and fat. Reduce your daily sodium intake. Most people with hypertension should eat less than 1,500 mg of sodium a day. Do not drink alcohol if: Your health care provider tells you not to drink. You are pregnant, may be pregnant, or are planning to become pregnant. If you drink alcohol: Limit how much you use to: 0-1 drink a day for women. 0-2 drinks a day for men. Be aware of how much alcohol is in your drink. In the [place] one drink equals one 12 oz bottle of beer (355 mL), one 5  oz glass of wine (148 mL), or one 1 oz glass of hard liquor (44 mL). Lifestyle  Work with your health care provider to maintain a healthy body weight or to lose weight. Ask what an ideal weight is for you. Get at least 30 minutes of exercise most days of the week. Activities may include walking, swimming, or biking. Include exercise to strengthen your muscles (resistance exercise), such as Pilates or lifting weights, as part of your weekly exercise routine. Try to do these types of exercises for 30 minutes at least 3 days a week. Do not use any products that contain nicotine or tobacco, such as cigarettes, e-cigarettes, and chewing tobacco. If you need help quitting, ask your health care provider. Monitor your blood pressure at home as told by your health care provider. Keep all follow-up visits as told by your health care provider. This is important. Medicines Take over-the-counter and prescription medicines only as told by your health care provider. Follow directions carefully. Blood pressure medicines must be taken as prescribed. Do not skip doses of blood pressure medicine. Doing this puts you at risk for problems and can make the medicine less effective. Ask your health care provider about side effects or reactions to medicines that you should watch for. Contact a health care provider if you: Think you are having a reaction to a medicine you are taking. Have headaches that keep coming back (recurring). Feel dizzy. Have swelling in your ankles. Have trouble with your vision. Get help right away if you: Develop a severe headache or confusion. Have unusual weakness or numbness. Feel faint. Have  severe pain in your chest or abdomen. Vomit repeatedly. Have trouble breathing. Summary Hypertension is when the force of blood pumping through your arteries is too strong. If this condition is not controlled, it may put you at risk for serious complications. Your personal target blood pressure  may vary depending on your medical conditions, your age, and other factors. For most people, a normal blood pressure is less than 120/80. Hypertension is treated with lifestyle changes, medicines, or a combination of both. Lifestyle changes include losing weight, eating a healthy, low-sodium diet, exercising more, and limiting alcohol. This information is not intended to replace advice given to you by your health care provider. Make sure you discuss any questions you have with your health care provider. Document Revised: 09/17/2017 Document Reviewed: 09/17/2017 Elsevier Patient Education  2022 Elsevier Inc.    Edwina Barth, MD [facility]

## 2020-11-01 NOTE — Telephone Encounter (Signed)
Refilled amlodipine 10 mg.to [facility].

## 2020-11-01 NOTE — Assessment & Plan Note (Signed)
Stable.  Diet and nutrition discussed.  Continue rosuvastatin 10 mg daily.  

## 2020-11-13 ENCOUNTER — Other Ambulatory Visit: Payer: Self-pay | Admitting: Emergency Medicine

## 2020-11-13 DIAGNOSIS — E785 Hyperlipidemia, unspecified: Secondary | ICD-10-CM

## 2020-11-13 MED ORDER — ROSUVASTATIN CALCIUM 10 MG PO TABS: 10.0000 mg | ORAL_TABLET | Freq: Every day | ORAL | 3 refills | Status: DC

## 2020-11-26 ENCOUNTER — Encounter: Payer: Self-pay | Admitting: Emergency Medicine

## 2021-01-24 ENCOUNTER — Other Ambulatory Visit: Payer: Self-pay | Admitting: Emergency Medicine

## 2021-01-24 DIAGNOSIS — I10 Essential (primary) hypertension: Secondary | ICD-10-CM

## 2021-05-03 ENCOUNTER — Ambulatory Visit (INDEPENDENT_AMBULATORY_CARE_PROVIDER_SITE_OTHER): Payer: PPO | Admitting: Emergency Medicine

## 2021-05-03 ENCOUNTER — Encounter: Payer: Self-pay | Admitting: Emergency Medicine

## 2021-05-03 VITALS — BP 130/76 | HR 75 | Temp 98.6°F | Ht 61.0 in | Wt 154.0 lb

## 2021-05-03 DIAGNOSIS — I10 Essential (primary) hypertension: Secondary | ICD-10-CM

## 2021-05-03 DIAGNOSIS — E785 Hyperlipidemia, unspecified: Secondary | ICD-10-CM | POA: Diagnosis not present

## 2021-05-03 DIAGNOSIS — R7303 Prediabetes: Secondary | ICD-10-CM

## 2021-05-03 LAB — COMPREHENSIVE METABOLIC PANEL
ALT: 15 U/L (ref 0–35)
AST: 18 U/L (ref 0–37)
Albumin: 4.4 g/dL (ref 3.5–5.2)
Alkaline Phosphatase: 58 U/L (ref 39–117)
BUN: 13 mg/dL (ref 6–23)
CO2: 33 mEq/L — ABNORMAL HIGH (ref 19–32)
Calcium: 9.7 mg/dL (ref 8.4–10.5)
Chloride: 103 mEq/L (ref 96–112)
Creatinine, Ser: 0.78 mg/dL (ref 0.40–1.20)
GFR: 74.08 mL/min (ref >60.00)
Glucose, Bld: 128 mg/dL — ABNORMAL HIGH (ref 70–99)
Potassium: 3.6 mEq/L (ref 3.5–5.1)
Sodium: 142 mEq/L (ref 135–145)
Total Bilirubin: 0.6 mg/dL (ref 0.2–1.2)
Total Protein: 7 g/dL (ref 6.0–8.3)

## 2021-05-03 LAB — LIPID PANEL
Cholesterol: 104 mg/dL (ref 0–200)
HDL: 50.7 mg/dL (ref >39.00)
LDL Cholesterol: 43 mg/dL (ref 0–99)
NonHDL: 53.38
Total CHOL/HDL Ratio: 2
Triglycerides: 54 mg/dL (ref 0.0–149.0)
VLDL: 10.8 mg/dL (ref 0.0–40.0)

## 2021-05-03 LAB — HEMOGLOBIN A1C: Hgb A1c MFr Bld: 6.8 % — ABNORMAL HIGH (ref 4.6–6.5)

## 2021-05-03 NOTE — Assessment & Plan Note (Signed)
Well-controlled hypertension.  Continue Zestoretic 20-12.5 mg and amlodipine 10 mg daily. ?Dietary approaches to stop hypertension discussed. ?BP Readings from Last 3 Encounters:  ?05/03/21 130/76  ?11/01/20 136/78  ?02/10/20 130/71  ? ? ?

## 2021-05-03 NOTE — Patient Instructions (Signed)
Health Maintenance After Age 76 After age 76, you are at a higher risk for certain long-term diseases and infections as well as injuries from falls. Falls are a major cause of broken bones and head injuries in people who are older than age 76. Getting regular preventive care can help to keep you healthy and well. Preventive care includes getting regular testing and making lifestyle changes as recommended by your health care provider. Talk with your health care provider about: Which screenings and tests you should have. A screening is a test that checks for a disease when you have no symptoms. A diet and exercise plan that is right for you. What should I know about screenings and tests to prevent falls? Screening and testing are the best ways to find a health problem early. Early diagnosis and treatment give you the best chance of managing medical conditions that are common after age 76. Certain conditions and lifestyle choices may make you more likely to have a fall. Your health care provider may recommend: Regular vision checks. Poor vision and conditions such as cataracts can make you more likely to have a fall. If you wear glasses, make sure to get your prescription updated if your vision changes. Medicine review. Work with your health care provider to regularly review all of the medicines you are taking, including over-the-counter medicines. Ask your health care provider about any side effects that may make you more likely to have a fall. Tell your health care provider if any medicines that you take make you feel dizzy or sleepy. Strength and balance checks. Your health care provider may recommend certain tests to check your strength and balance while standing, walking, or changing positions. Foot health exam. Foot pain and numbness, as well as not wearing proper footwear, can make you more likely to have a fall. Screenings, including: Osteoporosis screening. Osteoporosis is a condition that causes  the bones to get weaker and break more easily. Blood pressure screening. Blood pressure changes and medicines to control blood pressure can make you feel dizzy. Depression screening. You may be more likely to have a fall if you have a fear of falling, feel depressed, or feel unable to do activities that you used to do. Alcohol use screening. Using too much alcohol can affect your balance and may make you more likely to have a fall. Follow these instructions at home: Lifestyle Do not drink alcohol if: Your health care provider tells you not to drink. If you drink alcohol: Limit how much you have to: 0-1 drink a day for women. 0-2 drinks a day for men. Know how much alcohol is in your drink. In the [place] one drink equals one 12 oz bottle of beer (355 mL), one 5 oz glass of wine (148 mL), or one 1 oz glass of hard liquor (44 mL). Do not use any products that contain nicotine or tobacco. These products include cigarettes, chewing tobacco, and vaping devices, such as e-cigarettes. If you need help quitting, ask your health care provider. Activity  Follow a regular exercise program to stay fit. This will help you maintain your balance. Ask your health care provider what types of exercise are appropriate for you. If you need a cane or walker, use it as recommended by your health care provider. Wear supportive shoes that have nonskid soles. Safety  Remove any tripping hazards, such as rugs, cords, and clutter. Install safety equipment such as grab bars in bathrooms and safety rails on stairs. Keep rooms and walkways   well-lit. General instructions Talk with your health care provider about your risks for falling. Tell your health care provider if: You fall. Be sure to tell your health care provider about all falls, even ones that seem minor. You feel dizzy, tiredness (fatigue), or off-balance. Take over-the-counter and prescription medicines only as told by your health care provider. These include  supplements. Eat a healthy diet and maintain a healthy weight. A healthy diet includes low-fat dairy products, low-fat (lean) meats, and fiber from whole grains, beans, and lots of fruits and vegetables. Stay current with your vaccines. Schedule regular health, dental, and eye exams. Summary Having a healthy lifestyle and getting preventive care can help to protect your health and wellness after age 76. Screening and testing are the best way to find a health problem early and help you avoid having a fall. Early diagnosis and treatment give you the best chance for managing medical conditions that are more common for people who are older than age 76. Falls are a major cause of broken bones and head injuries in people who are older than age 76. Take precautions to prevent a fall at home. Work with your health care provider to learn what changes you can make to improve your health and wellness and to prevent falls. This information is not intended to replace advice given to you by your health care provider. Make sure you discuss any questions you have with your health care provider. Document Revised: 05/29/2020 Document Reviewed: 05/29/2020 Elsevier Patient Education  2022 [employer].  

## 2021-05-03 NOTE — Progress Notes (Signed)
Brenda [name] ?76 y.o. ? ? ?Chief Complaint  ?Patient presents with  ? Follow-up  ? ? ?HISTORY OF PRESENT ILLNESS: ?This is a 76 y.o. female with history of hypertension here for 76-month follow-up. ?Doing well.  Eating well and exercising regularly. ?Has no complaints or medical concerns today. ?BP Readings from Last 3 Encounters:  ?05/03/21 130/76  ?11/01/20 136/78  ?02/10/20 130/71  ? ? ? ?HPI ? ? ?Prior to Admission medications   ?Medication Sig Start Date End Date Taking? Authorizing Provider  ?amLODipine (NORVASC) 10 MG tablet Take 1 tablet (10 mg total) by mouth daily. 11/01/20  Yes Georgina Quint, MD  ?dorzolamide-timolol (COSOPT) 22.3-6.8 MG/ML ophthalmic solution  02/25/17  Yes [provider]  ?fexofenadine (ALLEGRA) 180 MG tablet Take 180 mg by mouth daily.   Yes [provider]  ?FLUZONE HIGH-DOSE QUADRIVALENT 0.7 ML SUSY  09/29/20  Yes [provider]  ?latanoprost (XALATAN) 0.005 % ophthalmic solution Place 1 drop into both eyes at bedtime. 05/02/16  Yes [provider]  ?lisinopril-hydrochlorothiazide (ZESTORETIC) 20-12.5 MG tablet Take 1 tablet by mouth once daily 01/24/21  Yes Maddisyn Hegwood, Eilleen Kempf, MD  ?rosuvastatin (CRESTOR) 10 MG tablet Take 1 tablet (10 mg total) by mouth daily. 11/13/20 02/11/21  Georgina Quint, MD  ? ? ?No Known Allergies ? ?Patient Active Problem List  ? Diagnosis Date Noted  ? Prediabetes 02/16/2019  ? Dyslipidemia 12/20/2008  ? Diverticulosis of colon 11/11/2006  ? Hypertension, essential 06/24/2006  ? COLONIC POLYPS, HX OF 06/24/2006  ? ? ?Past Medical History:  ?Diagnosis Date  ? Allergy   ? seasonal  ? Chronic rhinitis 01/19/2007  ? COLONIC POLYPS, HX OF 06/24/2006  ? DIVERTICULOSIS, COLON 11/11/2006  ? HYPERLIPIDEMIA 12/20/2008  ? HYPERTENSION 06/24/2006  ? Impaired glucose tolerance   ? MENOPAUSAL SYNDROME 06/24/2006  ? ? ?Past Surgical History:  ?Procedure Laterality Date  ? BREAST BIOPSY    ? COLONOSCOPY    ? EYE SURGERY Left  09/302021  ? EYE SURGERY Right 11/04/2019  ? POLYPECTOMY    ? TUBAL LIGATION  1994  ? benign  ? ? ?Social History  ? ?Socioeconomic History  ? Marital status: Married  ?  Spouse name: Not on file  ? Number of children: Not on file  ? Years of education: Not on file  ? Highest education level: Not on file  ?Occupational History  ? Not on file  ?Tobacco Use  ? Smoking status: Never  ? Smokeless tobacco: Never  ?Vaping Use  ? Vaping Use: Never used  ?Substance and Sexual Activity  ? Alcohol use: No  ? Drug use: No  ? Sexual activity: Not on file  ?Other Topics Concern  ? Not on file  ?Social History Narrative  ? Not on file  ? ?Social Determinants of Health  ? ?Financial Resource Strain: Not on file  ?Food Insecurity: Not on file  ?Transportation Needs: Not on file  ?Physical Activity: Not on file  ?Stress: Not on file  ?Social Connections: Not on file  ?Intimate Partner Violence: Not on file  ? ? ?Family History  ?Problem Relation Age of Onset  ? Mental illness Mother   ? Cancer Father   ?     lung  ? Parkinsonism Brother   ? Colon cancer Neg Hx   ? ? ? ?Review of Systems  ?Constitutional: Negative.  Negative for chills and fever.  ?HENT: Negative.    ?Cardiovascular: Negative.  Negative for chest pain and palpitations.  ?  Gastrointestinal: Negative.  Negative for abdominal pain, diarrhea, nausea and vomiting.  ?Genitourinary: Negative.   ?Skin: Negative.   ?Neurological: Negative.  Negative for dizziness and headaches.  ?All other systems reviewed and are negative. ? ?Today's Vitals  ? 05/03/21 0810  ?BP: 130/76  ?Pulse: 75  ?Temp: 98.6 ?F (37 ?C)  ?TempSrc: Oral  ?SpO2: 99%  ?Weight: 154 lb (69.9 kg)  ?Height: 5\' 1"  (1.549 m)  ? ?Body mass index is 29.1 kg/m?. ? ?Physical Exam ?Vitals reviewed.  ?Constitutional:   ?   Appearance: Normal appearance.  ?HENT:  ?   Head: Normocephalic.  ?   Mouth/Throat:  ?   Mouth: Mucous membranes are moist.  ?   Pharynx: Oropharynx is clear.  ?Eyes:  ?   Extraocular Movements:  Extraocular movements intact.  ?   Conjunctiva/sclera: Conjunctivae normal.  ?   Pupils: Pupils are equal, round, and reactive to light.  ?Cardiovascular:  ?   Rate and Rhythm: Normal rate and regular rhythm.  ?   Pulses: Normal pulses.  ?   Heart sounds: Normal heart sounds.  ?Pulmonary:  ?   Effort: Pulmonary effort is normal.  ?   Breath sounds: Normal breath sounds.  ?Abdominal:  ?   Palpations: Abdomen is soft.  ?   Tenderness: There is no abdominal tenderness.  ?Musculoskeletal:  ?   Cervical back: No tenderness.  ?   Right lower leg: No edema.  ?   Left lower leg: No edema.  ?Lymphadenopathy:  ?   Cervical: No cervical adenopathy.  ?Skin: ?   General: Skin is warm and dry.  ?   Capillary Refill: Capillary refill takes less than 2 seconds.  ?Neurological:  ?   General: No focal deficit present.  ?   Mental Status: She is alert and oriented to person, place, and time.  ?Psychiatric:     ?   Mood and Affect: Mood normal.     ?   Behavior: Behavior normal.  ? ? ? ?ASSESSMENT & PLAN: ?Problem List Items Addressed This Visit   ? ?  ? Cardiovascular and Mediastinum  ? Hypertension, essential - Primary  ?  Well-controlled hypertension.  Continue Zestoretic 20-12.5 mg and amlodipine 10 mg daily. ?Dietary approaches to stop hypertension discussed. ?BP Readings from Last 3 Encounters:  ?05/03/21 130/76  ?11/01/20 136/78  ?02/10/20 130/71  ? ? ?  ?  ? Relevant Orders  ? Comprehensive metabolic panel  ?  ? Other  ? Dyslipidemia  ?  Stable.  Diet and nutrition discussed. ?Continue rosuvastatin 10 mg daily. ? ?  ?  ? Relevant Orders  ? Lipid panel  ? Prediabetes  ?  Stable.  Hemoglobin A1c done today.  Diet and nutrition discussed. ?Advised to decrease amount of daily carbohydrate intake. ?  ?  ? Relevant Orders  ? Hemoglobin A1c  ? ?Patient Instructions  ?Health Maintenance After Age 76 ?After age 72, you are at a higher risk for certain long-term diseases and infections as well as injuries from falls. Falls are a major  cause of broken bones and head injuries in people who are older than age 17. Getting regular preventive care can help to keep you healthy and well. Preventive care includes getting regular testing and making lifestyle changes as recommended by your health care provider. Talk with your health care provider about: ?Which screenings and tests you should have. A screening is a test that checks for a disease when you have no symptoms. ?A  diet and exercise plan that is right for you. ?What should I know about screenings and tests to prevent falls? ?Screening and testing are the best ways to find a health problem early. Early diagnosis and treatment give you the best chance of managing medical conditions that are common after age 76. Certain conditions and lifestyle choices may make you more likely to have a fall. Your health care provider may recommend: ?Regular vision checks. Poor vision and conditions such as cataracts can make you more likely to have a fall. If you wear glasses, make sure to get your prescription updated if your vision changes. ?Medicine review. Work with your health care provider to regularly review all of the medicines you are taking, including over-the-counter medicines. Ask your health care provider about any side effects that may make you more likely to have a fall. Tell your health care provider if any medicines that you take make you feel dizzy or sleepy. ?Strength and balance checks. Your health care provider may recommend certain tests to check your strength and balance while standing, walking, or changing positions. ?Foot health exam. Foot pain and numbness, as well as not wearing proper footwear, can make you more likely to have a fall. ?Screenings, including: ?Osteoporosis screening. Osteoporosis is a condition that causes the bones to get weaker and break more easily. ?Blood pressure screening. Blood pressure changes and medicines to control blood pressure can make you feel  dizzy. ?Depression screening. You may be more likely to have a fall if you have a fear of falling, feel depressed, or feel unable to do activities that you used to do. ?Alcohol use screening. Using too much alcohol can affect y

## 2021-05-03 NOTE — Assessment & Plan Note (Signed)
Stable.  Hemoglobin A1c done today.  Diet and nutrition discussed. ?Advised to decrease amount of daily carbohydrate intake. ?

## 2021-05-03 NOTE — Assessment & Plan Note (Signed)
Stable.  Diet and nutrition discussed.  Continue rosuvastatin 10 mg daily.  

## 2021-06-01 DIAGNOSIS — Z961 Presence of intraocular lens: Secondary | ICD-10-CM | POA: Diagnosis not present

## 2021-06-01 DIAGNOSIS — H401112 Primary open-angle glaucoma, right eye, moderate stage: Secondary | ICD-10-CM | POA: Diagnosis not present

## 2021-06-01 DIAGNOSIS — I10 Essential (primary) hypertension: Secondary | ICD-10-CM | POA: Diagnosis not present

## 2021-06-01 DIAGNOSIS — H18413 Arcus senilis, bilateral: Secondary | ICD-10-CM | POA: Diagnosis not present

## 2021-06-05 ENCOUNTER — Ambulatory Visit (INDEPENDENT_AMBULATORY_CARE_PROVIDER_SITE_OTHER): Payer: PPO

## 2021-06-05 DIAGNOSIS — Z0000 Encounter for general adult medical examination without abnormal findings: Secondary | ICD-10-CM | POA: Diagnosis not present

## 2021-06-05 NOTE — Patient Instructions (Signed)
Ms. Bro , ?Thank you for taking time to come for your Medicare Wellness Visit. I appreciate your ongoing commitment to your health goals. Please review the following plan we discussed and let me know if I can assist you in the future.  ? ?Screening recommendations/referrals: ?Colonoscopy: 08/31/2012; due every 10 years ?Mammogram: 07/05/2020; due every year ?Bone Density: never done ?Recommended yearly ophthalmology/optometry visit for glaucoma screening and checkup ?Recommended yearly dental visit for hygiene and checkup ? ?Vaccinations: ?Influenza vaccine: 09/29/2020 ?Pneumococcal vaccine: 01/27/2013, 04/11/2014 ?Tdap vaccine: due  ?Shingles vaccine: never done   ?Covid-19:07/01/2019, 07/29/2019, 01/31/2020, 07/05/2020, 11/21/2020 ? ?Advanced directives: Yes; Please bring a copy of your health care power of attorney and living will to the office at your convenience. ? ?Conditions/risks identified: Yes; Client understands the importance of follow-up appointments with providers by attending scheduled visits and discussed goals to eat healthier, increase physical activity 5 times a week for 30 minutes each, exercise the brain by doing stimulating brain exercises (reading, adult coloring, crafting, listening to music, puzzles, etc.), socialize and enjoy life more, get enough sleep at least 8-9 hours average per night and make time for laughter. ? ?Next appointment: 06/07/2022 at 9:00 a.m. telephone visit with Mignon Pine, Nurse Health Advisor.  If you need to reschedule or cancel, please call [phone]. ? ? ?Preventive Care 76 Years and Older, Female ?Preventive care refers to lifestyle choices and visits with your health care provider that can promote health and wellness. ?What does preventive care include? ?A yearly physical exam. This is also called an annual well check. ?Dental exams once or twice a year. ?Routine eye exams. Ask your health care provider how often you should have your eyes checked. ?Personal lifestyle  choices, including: ?Daily care of your teeth and gums. ?Regular physical activity. ?Eating a healthy diet. ?Avoiding tobacco and drug use. ?Limiting alcohol use. ?Practicing safe sex. ?Taking low-dose aspirin every day. ?Taking vitamin and mineral supplements as recommended by your health care provider. ?What happens during an annual well check? ?The services and screenings done by your health care provider during your annual well check will depend on your age, overall health, lifestyle risk factors, and family history of disease. ?Counseling  ?Your health care provider may ask you questions about your: ?Alcohol use. ?Tobacco use. ?Drug use. ?Emotional well-being. ?Home and relationship well-being. ?Sexual activity. ?Eating habits. ?History of falls. ?Memory and ability to understand (cognition). ?Work and work Statistician. ?Reproductive health. ?Screening  ?You may have the following tests or measurements: ?Height, weight, and BMI. ?Blood pressure. ?Lipid and cholesterol levels. These may be checked every 5 years, or more frequently if you are over 47 years old. ?Skin check. ?Lung cancer screening. You may have this screening every year starting at age 59 if you have a 30-pack-year history of smoking and currently smoke or have quit within the past 15 years. ?Fecal occult blood test (FOBT) of the stool. You may have this test every year starting at age 49. ?Flexible sigmoidoscopy or colonoscopy. You may have a sigmoidoscopy every 5 years or a colonoscopy every 10 years starting at age 106. ?Hepatitis C blood test. ?Hepatitis B blood test. ?Sexually transmitted disease (STD) testing. ?Diabetes screening. This is done by checking your blood sugar (glucose) after you have not eaten for a while (fasting). You may have this done every 1-3 years. ?Bone density scan. This is done to screen for osteoporosis. You may have this done starting at age 10. ?Mammogram. This may be done every 1-2 years. Talk  to your health care  provider about how often you should have regular mammograms. ?Talk with your health care provider about your test results, treatment options, and if necessary, the need for more tests. ?Vaccines  ?Your health care provider may recommend certain vaccines, such as: ?Influenza vaccine. This is recommended every year. ?Tetanus, diphtheria, and acellular pertussis (Tdap, Td) vaccine. You may need a Td booster every 10 years. ?Zoster vaccine. You may need this after age 55. ?Pneumococcal 13-valent conjugate (PCV13) vaccine. One dose is recommended after age 67. ?Pneumococcal polysaccharide (PPSV23) vaccine. One dose is recommended after age 52. ?Talk to your health care provider about which screenings and vaccines you need and how often you need them. ?This information is not intended to replace advice given to you by your health care provider. Make sure you discuss any questions you have with your health care provider. ?Document Released: 02/03/2015 Document Revised: 09/27/2015 Document Reviewed: 11/08/2014 ?Elsevier Interactive Patient Education ? 2017 [place]. ? ?Fall Prevention in the Home ?Falls can cause injuries. They can happen to people of all ages. There are many things you can do to make your home safe and to help prevent falls. ?What can I do on the outside of my home? ?Regularly fix the edges of walkways and driveways and fix any cracks. ?Remove anything that might make you trip as you walk through a door, such as a raised step or threshold. ?Trim any bushes or trees on the path to your home. ?Use bright outdoor lighting. ?Clear any walking paths of anything that might make someone trip, such as rocks or tools. ?Regularly check to see if handrails are loose or broken. Make sure that both sides of any steps have handrails. ?Any raised decks and porches should have guardrails on the edges. ?Have any leaves, snow, or ice cleared regularly. ?Use sand or salt on walking paths during winter. ?Clean up any  spills in your garage right away. This includes oil or grease spills. ?What can I do in the bathroom? ?Use night lights. ?Install grab bars by the toilet and in the tub and shower. Do not use towel bars as grab bars. ?Use non-skid mats or decals in the tub or shower. ?If you need to sit down in the shower, use a plastic, non-slip stool. ?Keep the floor dry. Clean up any water that spills on the floor as soon as it happens. ?Remove soap buildup in the tub or shower regularly. ?Attach bath mats securely with double-sided non-slip rug tape. ?Do not have throw rugs and other things on the floor that can make you trip. ?What can I do in the bedroom? ?Use night lights. ?Make sure that you have a light by your bed that is easy to reach. ?Do not use any sheets or blankets that are too big for your bed. They should not hang down onto the floor. ?Have a firm chair that has side arms. You can use this for support while you get dressed. ?Do not have throw rugs and other things on the floor that can make you trip. ?What can I do in the kitchen? ?Clean up any spills right away. ?Avoid walking on wet floors. ?Keep items that you use a lot in easy-to-reach places. ?If you need to reach something above you, use a strong step stool that has a grab bar. ?Keep electrical cords out of the way. ?Do not use floor polish or wax that makes floors slippery. If you must use wax, use non-skid floor wax. ?Do  not have throw rugs and other things on the floor that can make you trip. ?What can I do with my stairs? ?Do not leave any items on the stairs. ?Make sure that there are handrails on both sides of the stairs and use them. Fix handrails that are broken or loose. Make sure that handrails are as long as the stairways. ?Check any carpeting to make sure that it is firmly attached to the stairs. Fix any carpet that is loose or worn. ?Avoid having throw rugs at the top or bottom of the stairs. If you do have throw rugs, attach them to the floor  with carpet tape. ?Make sure that you have a light switch at the top of the stairs and the bottom of the stairs. If you do not have them, ask someone to add them for you. ?What else can I do to help prevent falls?

## 2021-06-05 NOTE — Progress Notes (Signed)
?I connected with Brenda [name] today by telephone and verified that I am speaking with the correct person using two identifiers. ?Location patient: home ?Location provider: work ?Persons participating in the virtual visit: patient, provider. ?  ?I discussed the limitations, risks, security and privacy concerns of performing an evaluation and management service by telephone and the availability of in person appointments. I also discussed with the patient that there may be a patient responsible charge related to this service. The patient expressed understanding and verbally consented to this telephonic visit.  ?  ?Interactive audio and video telecommunications were attempted between this provider and patient, however failed, due to patient having technical difficulties OR patient did not have access to video capability.  We continued and completed visit with audio only. ? ?Some vital signs may be absent or patient reported.  ? ?Time Spent with patient on telephone encounter: 30 minutes ? ?Subjective:  ? Brenda [name] is a 76 y.o. female who presents for Medicare Annual (Subsequent) preventive examination. ? ?Review of Systems    ? ?Cardiac Risk Factors include: advanced age (>69men, >49 women);dyslipidemia ? ?   ?Objective:  ?  ?There were no vitals filed for this visit. ?There is no height or weight on file to calculate BMI. ? ? ?  06/05/2021  ?  8:50 AM 02/11/2019  ?  8:40 AM 06/14/2016  ?  2:57 PM  ?Advanced Directives  ?Does Patient Have a Medical Advance Directive? Yes Yes No  ?Type of Advance Directive Living will;Healthcare Power of [employer] Power of Attorney   ?Does patient want to make changes to medical advance directive? No - Patient declined    ?Copy of Healthcare Power of Attorney in Chart? No - copy requested No - copy requested   ?Would patient like information on creating a medical advance directive?   No - Patient declined  ? ? ?Current Medications (verified) ?Outpatient Encounter  Medications as of 06/05/2021  ?Medication Sig  ? amLODipine (NORVASC) 10 MG tablet Take 1 tablet (10 mg total) by mouth daily.  ? dorzolamide-timolol (COSOPT) 22.3-6.8 MG/ML ophthalmic solution   ? fexofenadine (ALLEGRA) 180 MG tablet Take 180 mg by mouth daily.  ? FLUZONE HIGH-DOSE QUADRIVALENT 0.7 ML SUSY   ? latanoprost (XALATAN) 0.005 % ophthalmic solution Place 1 drop into both eyes at bedtime.  ? lisinopril-hydrochlorothiazide (ZESTORETIC) 20-12.5 MG tablet Take 1 tablet by mouth once daily  ? rosuvastatin (CRESTOR) 10 MG tablet Take 1 tablet (10 mg total) by mouth daily.  ? ?No facility-administered encounter medications on file as of 06/05/2021.  ? ? ?Allergies (verified) ?Patient has no known allergies.  ? ?History: ?Past Medical History:  ?Diagnosis Date  ? Allergy   ? seasonal  ? Chronic rhinitis 01/19/2007  ? COLONIC POLYPS, HX OF 06/24/2006  ? DIVERTICULOSIS, COLON 11/11/2006  ? HYPERLIPIDEMIA 12/20/2008  ? HYPERTENSION 06/24/2006  ? Impaired glucose tolerance   ? MENOPAUSAL SYNDROME 06/24/2006  ? ?Past Surgical History:  ?Procedure Laterality Date  ? BREAST BIOPSY    ? COLONOSCOPY    ? EYE SURGERY Left 09/302021  ? EYE SURGERY Right 11/04/2019  ? POLYPECTOMY    ? TUBAL LIGATION  1994  ? benign  ? ?Family History  ?Problem Relation Age of Onset  ? Mental illness Mother   ? Cancer Father   ?     lung  ? Parkinsonism Brother   ? Colon cancer Neg Hx   ? ?Social History  ? ?Socioeconomic History  ? Marital status:  Married  ?  Spouse name: Not on file  ? Number of children: Not on file  ? Years of education: Not on file  ? Highest education level: Not on file  ?Occupational History  ? Not on file  ?Tobacco Use  ? Smoking status: Never  ? Smokeless tobacco: Never  ?Vaping Use  ? Vaping Use: Never used  ?Substance and Sexual Activity  ? Alcohol use: No  ? Drug use: No  ? Sexual activity: Not on file  ?Other Topics Concern  ? Not on file  ?Social History Narrative  ? Not on file  ? ?Social Determinants of Health   ? ?Financial Resource Strain: Low Risk   ? Difficulty of Paying Living Expenses: Not hard at all  ?Food Insecurity: No Food Insecurity  ? Worried About Programme researcher, broadcasting/film/video in the Last Year: Never true  ? Ran Out of Food in the Last Year: Never true  ?Transportation Needs: No Transportation Needs  ? Lack of Transportation (Medical): No  ? Lack of Transportation (Non-Medical): No  ?Physical Activity: Sufficiently Active  ? Days of Exercise per Week: 5 days  ? Minutes of Exercise per Session: 30 min  ?Stress: No Stress Concern Present  ? Feeling of Stress : Not at all  ?Social Connections: Socially Integrated  ? Frequency of Communication with Friends and Family: More than three times a week  ? Frequency of Social Gatherings with Friends and Family: More than three times a week  ? Attends Religious Services: More than 4 times per year  ? Active Member of Clubs or Organizations: Yes  ? Attends Banker Meetings: More than 4 times per year  ? Marital Status: Married  ? ? ?Tobacco Counseling ?Counseling given: Not Answered ? ? ?Clinical Intake: ? ?Pre-visit preparation completed: Yes ? ?Pain : No/denies pain ? ?  ? ?Nutritional Risks: None ?Diabetes: No ? ?How often do you need to have someone help you when you read instructions, pamphlets, or other written materials from your doctor or pharmacy?: 1 - Never ?What is the last grade level you completed in school?: HSG ? ?Diabetic? no ? ?Interpreter Needed?: No ? ?Information entered by :: Susie Cassette, LPN. ? ? ?Activities of Daily Living ? ?  06/05/2021  ?  8:58 AM  ?In your present state of health, do you have any difficulty performing the following activities:  ?Hearing? 0  ?Vision? 0  ?Difficulty concentrating or making decisions? 0  ?Walking or climbing stairs? 0  ?Dressing or bathing? 0  ?Doing errands, shopping? 0  ?Preparing Food and eating ? N  ?Using the Toilet? N  ?In the past six months, have you accidently leaked urine? N  ?Do you have problems  with loss of bowel control? N  ?Managing your Medications? N  ?Managing your Finances? N  ?Housekeeping or managing your Housekeeping? N  ? ? ?Patient Care Team: ?Georgina Quint, MD as PCP - General (Internal Medicine) ?Augustin Schooling, MD as Consulting Physician (Ophthalmology) ? ?Indicate any recent Medical Services you may have received from other than [facility] providers in the past year (date may be approximate). ? ?   ?Assessment:  ? This is a routine wellness examination for Brenda [name]. ? ?Hearing/Vision screen ?Hearing Screening - Comments:: Patient denied any hearing difficulties. ?No hearing aids. ?Vision Screening - Comments:: Patient wears readers only for fine print. ?Eye exam done by: Dr. Fredderick Phenix ? ?Dietary issues and exercise activities discussed: ?Current Exercise Habits: Home exercise routine,  Type of exercise: walking;treadmill, Time (Minutes): 30, Frequency (Times/Week): 5, Weekly Exercise (Minutes/Week): 150, Intensity: Moderate, Exercise limited by: None identified ? ? Goals Addressed   ?None ?  ?Depression Screen ? ?  06/05/2021  ?  8:51 AM 11/01/2020  ?  8:12 AM 02/10/2020  ?  8:26 AM 01/18/2020  ?  8:54 AM 08/10/2019  ?  8:18 AM 03/22/2019  ?  3:46 PM 02/16/2019  ?  8:09 AM  ?PHQ 2/9 Scores  ?PHQ - 2 Score 0 0 0 0 0 0 0  ?  ?Fall Risk ? ?  06/05/2021  ?  8:51 AM 11/01/2020  ?  8:12 AM 02/10/2020  ?  8:26 AM 01/18/2020  ?  8:53 AM 08/10/2019  ?  8:18 AM  ?Fall Risk   ?Falls in the past year? 0 0 0 0 0  ?Number falls in past yr: 0 0  0   ?Injury with Fall? 0 0  0   ?Risk for fall due to : No Fall Risks      ?Follow up Falls evaluation completed  Falls evaluation completed Falls evaluation completed Falls evaluation completed  ? ? ?FALL RISK PREVENTION PERTAINING TO THE HOME: ? ?Any stairs in or around the home? No  ?If so, are there any without handrails? No  ?Home free of loose throw rugs in walkways, pet beds, electrical cords, etc? Yes  ?Adequate lighting in your home to reduce risk of falls? Yes   ? ?ASSISTIVE DEVICES UTILIZED TO PREVENT FALLS: ? ?Life alert? No  ?Use of a cane, walker or w/c? No  ?Grab bars in the bathroom? No  ?Shower chair or bench in shower? No  ?Elevated toilet seat or a handicapped toilet? Yes

## 2021-07-11 DIAGNOSIS — Z1231 Encounter for screening mammogram for malignant neoplasm of breast: Secondary | ICD-10-CM | POA: Diagnosis not present

## 2021-07-11 LAB — HM MAMMOGRAPHY

## 2021-07-13 ENCOUNTER — Other Ambulatory Visit: Payer: Self-pay | Admitting: Emergency Medicine

## 2021-07-13 DIAGNOSIS — I10 Essential (primary) hypertension: Secondary | ICD-10-CM

## 2021-10-06 ENCOUNTER — Other Ambulatory Visit: Payer: Self-pay | Admitting: Emergency Medicine

## 2021-10-06 DIAGNOSIS — I10 Essential (primary) hypertension: Secondary | ICD-10-CM

## 2021-10-26 ENCOUNTER — Encounter: Payer: Self-pay | Admitting: Emergency Medicine

## 2021-11-05 ENCOUNTER — Ambulatory Visit (INDEPENDENT_AMBULATORY_CARE_PROVIDER_SITE_OTHER): Payer: PPO | Admitting: Emergency Medicine

## 2021-11-05 ENCOUNTER — Encounter: Payer: Self-pay | Admitting: Emergency Medicine

## 2021-11-05 VITALS — BP 132/86 | HR 63 | Temp 98.3°F | Ht 61.0 in | Wt 152.5 lb

## 2021-11-05 DIAGNOSIS — E785 Hyperlipidemia, unspecified: Secondary | ICD-10-CM

## 2021-11-05 DIAGNOSIS — I10 Essential (primary) hypertension: Secondary | ICD-10-CM

## 2021-11-05 DIAGNOSIS — R7303 Prediabetes: Secondary | ICD-10-CM

## 2021-11-05 LAB — CBC WITH DIFFERENTIAL/PLATELET
Basophils Absolute: 0 10*3/uL (ref 0.0–0.1)
Basophils Relative: 0.8 % (ref 0.0–3.0)
Eosinophils Absolute: 0.2 10*3/uL (ref 0.0–0.7)
Eosinophils Relative: 2.9 % (ref 0.0–5.0)
HCT: 42.6 % (ref 36.0–46.0)
Hemoglobin: 14.5 g/dL (ref 12.0–15.0)
Lymphocytes Relative: 39.7 % (ref 12.0–46.0)
Lymphs Abs: 2.1 10*3/uL (ref 0.7–4.0)
MCHC: 34 g/dL (ref 30.0–36.0)
MCV: 88.8 fl (ref 78.0–100.0)
Monocytes Absolute: 0.5 10*3/uL (ref 0.1–1.0)
Monocytes Relative: 8.7 % (ref 3.0–12.0)
Neutro Abs: 2.5 10*3/uL (ref 1.4–7.7)
Neutrophils Relative %: 47.9 % (ref 43.0–77.0)
Platelets: 245 10*3/uL (ref 150.0–400.0)
RBC: 4.8 Mil/uL (ref 3.87–5.11)
RDW: 13.8 % (ref 11.5–15.5)
WBC: 5.3 10*3/uL (ref 4.0–10.5)

## 2021-11-05 LAB — COMPREHENSIVE METABOLIC PANEL
ALT: 21 U/L (ref 0–35)
AST: 24 U/L (ref 0–37)
Albumin: 4.6 g/dL (ref 3.5–5.2)
Alkaline Phosphatase: 63 U/L (ref 39–117)
BUN: 12 mg/dL (ref 6–23)
CO2: 31 mEq/L (ref 19–32)
Calcium: 10.1 mg/dL (ref 8.4–10.5)
Chloride: 102 mEq/L (ref 96–112)
Creatinine, Ser: 0.81 mg/dL (ref 0.40–1.20)
GFR: 70.55 mL/min (ref >60.00)
Glucose, Bld: 136 mg/dL — ABNORMAL HIGH (ref 70–99)
Potassium: 4 mEq/L (ref 3.5–5.1)
Sodium: 141 mEq/L (ref 135–145)
Total Bilirubin: 0.7 mg/dL (ref 0.2–1.2)
Total Protein: 7.6 g/dL (ref 6.0–8.3)

## 2021-11-05 LAB — POCT GLYCOSYLATED HEMOGLOBIN (HGB A1C): Hemoglobin A1C: 6.1 % — AB (ref 4.0–5.6)

## 2021-11-05 LAB — LIPID PANEL
Cholesterol: 107 mg/dL (ref 0–200)
HDL: 42.5 mg/dL (ref >39.00)
LDL Cholesterol: 47 mg/dL (ref 0–99)
NonHDL: 64.82
Total CHOL/HDL Ratio: 3
Triglycerides: 90 mg/dL (ref 0.0–149.0)
VLDL: 18 mg/dL (ref 0.0–40.0)

## 2021-11-05 MED ORDER — ROSUVASTATIN CALCIUM 10 MG PO TABS: 10.0000 mg | ORAL_TABLET | Freq: Every day | ORAL | 3 refills | Status: DC

## 2021-11-05 NOTE — Assessment & Plan Note (Signed)
Well-controlled hypertension. Continue amlodipine 10 mg and Zestoretic 20-12.5 mg daily. Cardiovascular risks associated with hypertension discussed.

## 2021-11-05 NOTE — Patient Instructions (Signed)
Health Maintenance After Age 76 After age 76, you are at a higher risk for certain long-term diseases and infections as well as injuries from falls. Falls are a major cause of broken bones and head injuries in people who are older than age 76. Getting regular preventive care can help to keep you healthy and well. Preventive care includes getting regular testing and making lifestyle changes as recommended by your health care provider. Talk with your health care provider about: Which screenings and tests you should have. A screening is a test that checks for a disease when you have no symptoms. A diet and exercise plan that is right for you. What should I know about screenings and tests to prevent falls? Screening and testing are the best ways to find a health problem early. Early diagnosis and treatment give you the best chance of managing medical conditions that are common after age 76. Certain conditions and lifestyle choices may make you more likely to have a fall. Your health care provider may recommend: Regular vision checks. Poor vision and conditions such as cataracts can make you more likely to have a fall. If you wear glasses, make sure to get your prescription updated if your vision changes. Medicine review. Work with your health care provider to regularly review all of the medicines you are taking, including over-the-counter medicines. Ask your health care provider about any side effects that may make you more likely to have a fall. Tell your health care provider if any medicines that you take make you feel dizzy or sleepy. Strength and balance checks. Your health care provider may recommend certain tests to check your strength and balance while standing, walking, or changing positions. Foot health exam. Foot pain and numbness, as well as not wearing proper footwear, can make you more likely to have a fall. Screenings, including: Osteoporosis screening. Osteoporosis is a condition that causes  the bones to get weaker and break more easily. Blood pressure screening. Blood pressure changes and medicines to control blood pressure can make you feel dizzy. Depression screening. You may be more likely to have a fall if you have a fear of falling, feel depressed, or feel unable to do activities that you used to do. Alcohol use screening. Using too much alcohol can affect your balance and may make you more likely to have a fall. Follow these instructions at home: Lifestyle Do not drink alcohol if: Your health care provider tells you not to drink. If you drink alcohol: Limit how much you have to: 0-1 drink a day for women. 0-2 drinks a day for men. Know how much alcohol is in your drink. In the [place] one drink equals one 12 oz bottle of beer (355 mL), one 5 oz glass of wine (148 mL), or one 1 oz glass of hard liquor (44 mL). Do not use any products that contain nicotine or tobacco. These products include cigarettes, chewing tobacco, and vaping devices, such as e-cigarettes. If you need help quitting, ask your health care provider. Activity  Follow a regular exercise program to stay fit. This will help you maintain your balance. Ask your health care provider what types of exercise are appropriate for you. If you need a cane or walker, use it as recommended by your health care provider. Wear supportive shoes that have nonskid soles. Safety  Remove any tripping hazards, such as rugs, cords, and clutter. Install safety equipment such as grab bars in bathrooms and safety rails on stairs. Keep rooms and walkways   well-lit. General instructions Talk with your health care provider about your risks for falling. Tell your health care provider if: You fall. Be sure to tell your health care provider about all falls, even ones that seem minor. You feel dizzy, tiredness (fatigue), or off-balance. Take over-the-counter and prescription medicines only as told by your health care provider. These include  supplements. Eat a healthy diet and maintain a healthy weight. A healthy diet includes low-fat dairy products, low-fat (lean) meats, and fiber from whole grains, beans, and lots of fruits and vegetables. Stay current with your vaccines. Schedule regular health, dental, and eye exams. Summary Having a healthy lifestyle and getting preventive care can help to protect your health and wellness after age 76. Screening and testing are the best way to find a health problem early and help you avoid having a fall. Early diagnosis and treatment give you the best chance for managing medical conditions that are more common for people who are older than age 76. Falls are a major cause of broken bones and head injuries in people who are older than age 76. Take precautions to prevent a fall at home. Work with your health care provider to learn what changes you can make to improve your health and wellness and to prevent falls. This information is not intended to replace advice given to you by your health care provider. Make sure you discuss any questions you have with your health care provider. Document Revised: 05/29/2020 Document Reviewed: 05/29/2020 [employer] Patient Education  2023 [employer]  

## 2021-11-05 NOTE — Progress Notes (Signed)
Brenda [name] 76 y.o.   Chief Complaint  Patient presents with   Follow-up    6 mnth f/u appt , no concerns     HISTORY OF PRESENT ILLNESS: This is a 76 y.o. female here for 85-month follow-up of chronic medical problems. Overall doing well.  Has no complaints or medical concerns today.  HPI   Prior to Admission medications   Medication Sig Start Date End Date Taking? Authorizing Provider  amLODipine (NORVASC) 10 MG tablet Take 1 tablet by mouth once daily 10/07/21  Yes Jermanie Minshall, Ines Bloomer, MD  dorzolamide-timolol (COSOPT) 22.3-6.8 MG/ML ophthalmic solution  02/25/17  Yes [provider]  fexofenadine (ALLEGRA) 180 MG tablet Take 180 mg by mouth daily.   Yes [provider]  latanoprost (XALATAN) 0.005 % ophthalmic solution Place 1 drop into both eyes at bedtime. 05/02/16  Yes [provider]  lisinopril-hydrochlorothiazide (ZESTORETIC) 20-12.5 MG tablet Take 1 tablet by mouth once daily 10/07/21  Yes Gurdeep Keesey, Ines Bloomer, MD  rosuvastatin (CRESTOR) 10 MG tablet Take 1 tablet (10 mg total) by mouth daily. 11/13/20 02/11/21  Horald Pollen, MD    No Known Allergies  Patient Active Problem List   Diagnosis Date Noted   Prediabetes 02/16/2019   Dyslipidemia 12/20/2008   Diverticulosis of colon 11/11/2006   Hypertension, essential 06/24/2006   COLONIC POLYPS, HX OF 06/24/2006    Past Medical History:  Diagnosis Date   Allergy    seasonal   Chronic rhinitis 01/19/2007   COLONIC POLYPS, HX OF 06/24/2006   DIVERTICULOSIS, COLON 11/11/2006   HYPERLIPIDEMIA 12/20/2008   HYPERTENSION 06/24/2006   Impaired glucose tolerance    MENOPAUSAL SYNDROME 06/24/2006    Past Surgical History:  Procedure Laterality Date   BREAST BIOPSY     COLONOSCOPY     EYE SURGERY Left 09/302021   EYE SURGERY Right 11/04/2019   POLYPECTOMY     TUBAL LIGATION  1994   benign    Social History   Socioeconomic History   Marital status: Married    Spouse name: Not  on file   Number of children: Not on file   Years of education: Not on file   Highest education level: Not on file  Occupational History   Not on file  Tobacco Use   Smoking status: Never   Smokeless tobacco: Never  Vaping Use   Vaping Use: Never used  Substance and Sexual Activity   Alcohol use: No   Drug use: No   Sexual activity: Not on file  Other Topics Concern   Not on file  Social History Narrative   Not on file   Social Determinants of Health   Financial Resource Strain: Low Risk  (06/05/2021)   Overall Financial Resource Strain (CARDIA)    Difficulty of Paying Living Expenses: Not hard at all  Food Insecurity: No Food Insecurity (06/05/2021)   Hunger Vital Sign    Worried About Running Out of Food in the Last Year: Never true    Ran Out of Food in the Last Year: Never true  Transportation Needs: No Transportation Needs (06/05/2021)   PRAPARE - Hydrologist (Medical): No    Lack of Transportation (Non-Medical): No  Physical Activity: Sufficiently Active (06/05/2021)   Exercise Vital Sign    Days of Exercise per Week: 5 days    Minutes of Exercise per Session: 30 min  Stress: No Stress Concern Present (06/05/2021)   [place]  Stress Questionnaire    Feeling of Stress : Not at all  Social Connections: Socially Integrated (06/05/2021)   Social Connection and Isolation Panel [NHANES]    Frequency of Communication with Friends and Family: More than three times a week    Frequency of Social Gatherings with Friends and Family: More than three times a week    Attends Religious Services: More than 4 times per year    Active Member of [employer] or Organizations: Yes    Attends Engineer, structural: More than 4 times per year    Marital Status: Married  Catering manager Violence: Not At Risk (06/05/2021)   Humiliation, Afraid, Rape, and Kick questionnaire    Fear of Current or Ex-Partner: No     Emotionally Abused: No    Physically Abused: No    Sexually Abused: No    Family History  Problem Relation Age of Onset   Mental illness Mother    Cancer Father        lung   Parkinsonism Brother    Colon cancer Neg Hx      Review of Systems  Constitutional: Negative.  Negative for chills and fever.  HENT: Negative.  Negative for congestion and sore throat.   Respiratory: Negative.  Negative for cough and shortness of breath.   Cardiovascular: Negative.  Negative for chest pain and palpitations.  Gastrointestinal:  Negative for abdominal pain, nausea and vomiting.  Genitourinary: Negative.   Skin: Negative.  Negative for rash.  Neurological: Negative.  Negative for dizziness and headaches.  All other systems reviewed and are negative.   Today's Vitals   11/05/21 0806  BP: 132/86  Pulse: 63  Temp: 98.3 F (36.8 C)  TempSrc: Oral  SpO2: 97%  Weight: 152 lb 8 oz (69.2 kg)  Height: 5\' 1"  (1.549 m)   Body mass index is 28.81 kg/m. Lab Results  Component Value Date   HGBA1C 6.8 (H) 05/03/2021    Physical Exam Vitals reviewed.  Constitutional:      Appearance: Normal appearance.  HENT:     Head: Normocephalic.     Mouth/Throat:     Mouth: Mucous membranes are moist.     Pharynx: Oropharynx is clear.  Eyes:     Extraocular Movements: Extraocular movements intact.     Pupils: Pupils are equal, round, and reactive to light.  Cardiovascular:     Rate and Rhythm: Normal rate and regular rhythm.     Pulses: Normal pulses.     Heart sounds: Normal heart sounds.  Pulmonary:     Effort: Pulmonary effort is normal.     Breath sounds: Normal breath sounds.  Musculoskeletal:     Cervical back: No tenderness.     Right lower leg: No edema.     Left lower leg: No edema.  Lymphadenopathy:     Cervical: No cervical adenopathy.  Skin:    General: Skin is warm and dry.  Neurological:     General: No focal deficit present.     Mental Status: She is alert and oriented  to person, place, and time.  Psychiatric:        Mood and Affect: Mood normal.        Behavior: Behavior normal.    Results for orders placed or performed in visit on 11/05/21 (from the past 24 hour(s))  POCT HgB A1C     Status: Abnormal   Collection Time: 11/05/21  8:48 AM  Result Value Ref Range   Hemoglobin A1C  6.1 (A) 4.0 - 5.6 %   HbA1c POC (<> result, manual entry)     HbA1c, POC (prediabetic range)     HbA1c, POC (controlled diabetic range)       ASSESSMENT & PLAN: A total of 43 minutes was spent with the patient and counseling/coordination of care regarding preparing for this visit, review of most recent office visit notes, review of multiple chronic medical problems and their management, review of most recent blood work results including today's interpretation of hemoglobin A1c, review of all medications, education on nutrition, prognosis, documentation, and need for follow-up.  Problem List Items Addressed This Visit       Cardiovascular and Mediastinum   Hypertension, essential - Primary    Well-controlled hypertension. Continue amlodipine 10 mg and Zestoretic 20-12.5 mg daily. Cardiovascular risks associated with hypertension discussed.      Relevant Medications   rosuvastatin (CRESTOR) 10 MG tablet   Other Relevant Orders   CBC with Differential/Platelet   Comprehensive metabolic panel     Other   Dyslipidemia    Stable.  Diet and nutrition discussed. Continue rosuvastatin 10 mg daily.       Relevant Medications   rosuvastatin (CRESTOR) 10 MG tablet   Other Relevant Orders   Lipid panel   Prediabetes    Stable.  Diet and nutrition discussed. Heme globin A1c is 6.1. Cardiovascular risks associated with diabetes discussed.      Relevant Orders   POCT HgB A1C (Completed)   Patient Instructions  Health Maintenance After Age 63 After age 53, you are at a higher risk for certain long-term diseases and infections as well as injuries from falls. Falls  are a major cause of broken bones and head injuries in people who are older than age 28. Getting regular preventive care can help to keep you healthy and well. Preventive care includes getting regular testing and making lifestyle changes as recommended by your health care provider. Talk with your health care provider about: Which screenings and tests you should have. A screening is a test that checks for a disease when you have no symptoms. A diet and exercise plan that is right for you. What should I know about screenings and tests to prevent falls? Screening and testing are the best ways to find a health problem early. Early diagnosis and treatment give you the best chance of managing medical conditions that are common after age 37. Certain conditions and lifestyle choices may make you more likely to have a fall. Your health care provider may recommend: Regular vision checks. Poor vision and conditions such as cataracts can make you more likely to have a fall. If you wear glasses, make sure to get your prescription updated if your vision changes. Medicine review. Work with your health care provider to regularly review all of the medicines you are taking, including over-the-counter medicines. Ask your health care provider about any side effects that may make you more likely to have a fall. Tell your health care provider if any medicines that you take make you feel dizzy or sleepy. Strength and balance checks. Your health care provider may recommend certain tests to check your strength and balance while standing, walking, or changing positions. Foot health exam. Foot pain and numbness, as well as not wearing proper footwear, can make you more likely to have a fall. Screenings, including: Osteoporosis screening. Osteoporosis is a condition that causes the bones to get weaker and break more easily. Blood pressure screening. Blood pressure changes and  medicines to control blood pressure can make you feel  dizzy. Depression screening. You may be more likely to have a fall if you have a fear of falling, feel depressed, or feel unable to do activities that you used to do. Alcohol use screening. Using too much alcohol can affect your balance and may make you more likely to have a fall. Follow these instructions at home: Lifestyle Do not drink alcohol if: Your health care provider tells you not to drink. If you drink alcohol: Limit how much you have to: 0-1 drink a day for women. 0-2 drinks a day for men. Know how much alcohol is in your drink. In the [place] one drink equals one 12 oz bottle of beer (355 mL), one 5 oz glass of wine (148 mL), or one 1 oz glass of hard liquor (44 mL). Do not use any products that contain nicotine or tobacco. These products include cigarettes, chewing tobacco, and vaping devices, such as e-cigarettes. If you need help quitting, ask your health care provider. Activity  Follow a regular exercise program to stay fit. This will help you maintain your balance. Ask your health care provider what types of exercise are appropriate for you. If you need a cane or walker, use it as recommended by your health care provider. Wear supportive shoes that have nonskid soles. Safety  Remove any tripping hazards, such as rugs, cords, and clutter. Install safety equipment such as grab bars in bathrooms and safety rails on stairs. Keep rooms and walkways well-lit. General instructions Talk with your health care provider about your risks for falling. Tell your health care provider if: You fall. Be sure to tell your health care provider about all falls, even ones that seem minor. You feel dizzy, tiredness (fatigue), or off-balance. Take over-the-counter and prescription medicines only as told by your health care provider. These include supplements. Eat a healthy diet and maintain a healthy weight. A healthy diet includes low-fat dairy products, low-fat (lean) meats, and fiber from whole  grains, beans, and lots of fruits and vegetables. Stay current with your vaccines. Schedule regular health, dental, and eye exams. Summary Having a healthy lifestyle and getting preventive care can help to protect your health and wellness after age 72. Screening and testing are the best way to find a health problem early and help you avoid having a fall. Early diagnosis and treatment give you the best chance for managing medical conditions that are more common for people who are older than age 43. Falls are a major cause of broken bones and head injuries in people who are older than age 82. Take precautions to prevent a fall at home. Work with your health care provider to learn what changes you can make to improve your health and wellness and to prevent falls. This information is not intended to replace advice given to you by your health care provider. Make sure you discuss any questions you have with your health care provider. Document Revised: 05/29/2020 Document Reviewed: 05/29/2020 Elsevier Patient Education  2023 Else[employer].    Edwina Barth, MD [facility]

## 2021-11-05 NOTE — Assessment & Plan Note (Signed)
Stable.  Diet and nutrition discussed.  Continue rosuvastatin 10 mg daily.  

## 2021-11-05 NOTE — Assessment & Plan Note (Signed)
Stable.  Diet and nutrition discussed. Heme globin A1c is 6.1. Cardiovascular risks associated with diabetes discussed.

## 2022-01-01 ENCOUNTER — Other Ambulatory Visit: Payer: Self-pay | Admitting: Emergency Medicine

## 2022-01-01 DIAGNOSIS — I10 Essential (primary) hypertension: Secondary | ICD-10-CM

## 2022-05-08 ENCOUNTER — Encounter: Payer: Self-pay | Admitting: Emergency Medicine

## 2022-05-08 ENCOUNTER — Ambulatory Visit (INDEPENDENT_AMBULATORY_CARE_PROVIDER_SITE_OTHER): Payer: PPO | Admitting: Emergency Medicine

## 2022-05-08 VITALS — BP 134/72 | HR 63 | Temp 97.6°F | Ht 61.0 in | Wt 151.0 lb

## 2022-05-08 DIAGNOSIS — E785 Hyperlipidemia, unspecified: Secondary | ICD-10-CM | POA: Diagnosis not present

## 2022-05-08 DIAGNOSIS — I10 Essential (primary) hypertension: Secondary | ICD-10-CM

## 2022-05-08 DIAGNOSIS — R7303 Prediabetes: Secondary | ICD-10-CM

## 2022-05-08 LAB — COMPREHENSIVE METABOLIC PANEL
ALT: 15 U/L (ref 0–35)
AST: 19 U/L (ref 0–37)
Albumin: 4.5 g/dL (ref 3.5–5.2)
Alkaline Phosphatase: 57 U/L (ref 39–117)
BUN: 14 mg/dL (ref 6–23)
CO2: 32 mEq/L (ref 19–32)
Calcium: 9.9 mg/dL (ref 8.4–10.5)
Chloride: 102 mEq/L (ref 96–112)
Creatinine, Ser: 0.83 mg/dL (ref 0.40–1.20)
GFR: 68.27 mL/min (ref >60.00)
Glucose, Bld: 125 mg/dL — ABNORMAL HIGH (ref 70–99)
Potassium: 3.8 mEq/L (ref 3.5–5.1)
Sodium: 142 mEq/L (ref 135–145)
Total Bilirubin: 0.8 mg/dL (ref 0.2–1.2)
Total Protein: 7.2 g/dL (ref 6.0–8.3)

## 2022-05-08 LAB — LIPID PANEL
Cholesterol: 101 mg/dL (ref 0–200)
HDL: 43.7 mg/dL (ref >39.00)
LDL Cholesterol: 39 mg/dL (ref 0–99)
NonHDL: 56.94
Total CHOL/HDL Ratio: 2
Triglycerides: 90 mg/dL (ref 0.0–149.0)
VLDL: 18 mg/dL (ref 0.0–40.0)

## 2022-05-08 LAB — HEMOGLOBIN A1C: Hgb A1c MFr Bld: 6.6 % — ABNORMAL HIGH (ref 4.6–6.5)

## 2022-05-08 NOTE — Assessment & Plan Note (Signed)
Well-controlled hypertension.  No concerns. Continue amlodipine 10 mg and Zestoretic 20-12.5 mg daily Cardiovascular risk associated with hypertension discussed Dietary approaches to stop hypertension discussed Blood work done today Follow-up in 6 months

## 2022-05-08 NOTE — Progress Notes (Signed)
Brenda [name] 77 y.o.   Chief complaint: 20-month follow-up of multiple chronic medical conditions  HISTORY OF PRESENT ILLNESS: This is a 77 y.o. female here for follow-up of hypertension, dyslipidemia, prediabetes Overall doing well.  Has no complaints or medical concerns today.  HPI   Prior to Admission medications   Medication Sig Start Date End Date Taking? Authorizing Provider  amLODipine (NORVASC) 10 MG tablet Take 1 tablet by mouth once daily 01/01/22  Yes Tori Cupps, Eilleen Kempf, MD  dorzolamide-timolol (COSOPT) 22.3-6.8 MG/ML ophthalmic solution  02/25/17  Yes [provider]  fexofenadine (ALLEGRA) 180 MG tablet Take 180 mg by mouth daily.   Yes [provider]  latanoprost (XALATAN) 0.005 % ophthalmic solution Place 1 drop into both eyes at bedtime. 05/02/16  Yes [provider]  lisinopril-hydrochlorothiazide (ZESTORETIC) 20-12.5 MG tablet Take 1 tablet by mouth once daily 01/01/22  Yes Apolinar Bero, Eilleen Kempf, MD  rosuvastatin (CRESTOR) 10 MG tablet Take 1 tablet (10 mg total) by mouth daily. 11/05/21 10/31/22 Yes Georgina Quint, MD    No Known Allergies  Patient Active Problem List   Diagnosis Date Noted   Prediabetes 02/16/2019   Dyslipidemia 12/20/2008   Diverticulosis of colon 11/11/2006   Hypertension, essential 06/24/2006   COLONIC POLYPS, HX OF 06/24/2006    Past Medical History:  Diagnosis Date   Allergy    seasonal   Chronic rhinitis 01/19/2007   COLONIC POLYPS, HX OF 06/24/2006   DIVERTICULOSIS, COLON 11/11/2006   HYPERLIPIDEMIA 12/20/2008   HYPERTENSION 06/24/2006   Impaired glucose tolerance    MENOPAUSAL SYNDROME 06/24/2006    Past Surgical History:  Procedure Laterality Date   BREAST BIOPSY     COLONOSCOPY     EYE SURGERY Left 09/302021   EYE SURGERY Right 11/04/2019   POLYPECTOMY     TUBAL LIGATION  1994   benign    Social History   Socioeconomic History   Marital status: Married    Spouse name: Not on  file   Number of children: Not on file   Years of education: Not on file   Highest education level: Not on file  Occupational History   Not on file  Tobacco Use   Smoking status: Never   Smokeless tobacco: Never  Vaping Use   Vaping Use: Never used  Substance and Sexual Activity   Alcohol use: No   Drug use: No   Sexual activity: Not on file  Other Topics Concern   Not on file  Social History Narrative   Not on file   Social Determinants of Health   Financial Resource Strain: Low Risk  (06/05/2021)   Overall Financial Resource Strain (CARDIA)    Difficulty of Paying Living Expenses: Not hard at all  Food Insecurity: No Food Insecurity (06/05/2021)   Hunger Vital Sign    Worried About Running Out of Food in the Last Year: Never true    Ran Out of Food in the Last Year: Never true  Transportation Needs: No Transportation Needs (06/05/2021)   PRAPARE - Administrator, Civil Service (Medical): No    Lack of Transportation (Non-Medical): No  Physical Activity: Sufficiently Active (06/05/2021)   Exercise Vital Sign    Days of Exercise per Week: 5 days    Minutes of Exercise per Session: 30 min  Stress: No Stress Concern Present (06/05/2021)   [employer] of Occupational Health - Occupational Stress Questionnaire    Feeling of Stress : Not at all  Social  Connections: Socially Integrated (06/05/2021)   Social Connection and Isolation Panel [NHANES]    Frequency of Communication with Friends and Family: More than three times a week    Frequency of Social Gatherings with Friends and Family: More than three times a week    Attends Religious Services: More than 4 times per year    Active Member of [employer] or Organizations: Yes    Attends Engineer, structural: More than 4 times per year    Marital Status: Married  Catering manager Violence: Not At Risk (06/05/2021)   Humiliation, Afraid, Rape, and Kick questionnaire    Fear of Current or Ex-Partner: No     Emotionally Abused: No    Physically Abused: No    Sexually Abused: No    Family History  Problem Relation Age of Onset   Mental illness Mother    Cancer Father        lung   Parkinsonism Brother    Colon cancer Neg Hx      Review of Systems  Constitutional: Negative.  Negative for chills and fever.  HENT: Negative.  Negative for congestion and sore throat.   Respiratory: Negative.  Negative for cough and shortness of breath.   Cardiovascular: Negative.  Negative for chest pain and palpitations.  Gastrointestinal: Negative.  Negative for abdominal pain, diarrhea, nausea and vomiting.  Genitourinary: Negative.  Negative for dysuria.  Skin: Negative.  Negative for rash.  Neurological: Negative.  Negative for dizziness and headaches.  All other systems reviewed and are negative.   Vitals:   05/08/22 0812  BP: 134/72  Pulse: 63  Temp: 97.6 F (36.4 C)  SpO2: 98%    Physical Exam Vitals reviewed.  Constitutional:      Appearance: Normal appearance.  HENT:     Head: Normocephalic.     Mouth/Throat:     Mouth: Mucous membranes are moist.     Pharynx: Oropharynx is clear.  Eyes:     Extraocular Movements: Extraocular movements intact.     Conjunctiva/sclera: Conjunctivae normal.     Pupils: Pupils are equal, round, and reactive to light.  Cardiovascular:     Rate and Rhythm: Normal rate and regular rhythm.     Pulses: Normal pulses.     Heart sounds: Normal heart sounds.  Pulmonary:     Effort: Pulmonary effort is normal.     Breath sounds: Normal breath sounds.  Abdominal:     Palpations: Abdomen is soft.     Tenderness: There is no abdominal tenderness.  Musculoskeletal:     Cervical back: No tenderness.  Lymphadenopathy:     Cervical: No cervical adenopathy.  Skin:    General: Skin is warm and dry.     Capillary Refill: Capillary refill takes less than 2 seconds.  Neurological:     General: No focal deficit present.     Mental Status: She is alert and  oriented to person, place, and time.  Psychiatric:        Mood and Affect: Mood normal.        Behavior: Behavior normal.      ASSESSMENT & PLAN: A total of 44 minutes was spent with the patient and counseling/coordination of care regarding preparing for this visit, review of most recent office visit notes, review of multiple chronic medical conditions and their management, review of most recent blood work results, review of all medications, cardiovascular risks associated with hypertension and dyslipidemia, education on nutrition, review of health maintenance items, prognosis,  documentation and need for follow-up.  Problem List Items Addressed This Visit       Cardiovascular and Mediastinum   Hypertension, essential - Primary    Well-controlled hypertension.  No concerns. Continue amlodipine 10 mg and Zestoretic 20-12.5 mg daily Cardiovascular risk associated with hypertension discussed Dietary approaches to stop hypertension discussed Blood work done today Follow-up in 6 months      Relevant Orders   Comprehensive metabolic panel   Lipid panel   Hemoglobin A1c     Other   Dyslipidemia    Cardiovascular risks associated with dyslipidemia discussed Diet and nutrition discussed Continue rosuvastatin 10 mg daily       Relevant Orders   Comprehensive metabolic panel   Lipid panel   Hemoglobin A1c   Prediabetes    Eating better and exercising more with treadmill at home Diet and nutrition discussed Hemoglobin A1c done today      Relevant Orders   Comprehensive metabolic panel   Lipid panel   Hemoglobin A1c   Patient Instructions  Hypertension, Adult High blood pressure (hypertension) is when the force of blood pumping through the arteries is too strong. The arteries are the blood vessels that carry blood from the heart throughout the body. Hypertension forces the heart to work harder to pump blood and may cause arteries to become narrow or stiff. Untreated or  uncontrolled hypertension can lead to a heart attack, heart failure, a stroke, kidney disease, and other problems. A blood pressure reading consists of a higher number over a lower number. Ideally, your blood pressure should be below 120/80. The first ("top") number is called the systolic pressure. It is a measure of the pressure in your arteries as your heart beats. The second ("bottom") number is called the diastolic pressure. It is a measure of the pressure in your arteries as the heart relaxes. What are the causes? The exact cause of this condition is not known. There are some conditions that result in high blood pressure. What increases the risk? Certain factors may make you more likely to develop high blood pressure. Some of these risk factors are under your control, including: Smoking. Not getting enough exercise or physical activity. Being overweight. Having too much fat, sugar, calories, or salt (sodium) in your diet. Drinking too much alcohol. Other risk factors include: Having a personal history of heart disease, diabetes, high cholesterol, or kidney disease. Stress. Having a family history of high blood pressure and high cholesterol. Having obstructive sleep apnea. Age. The risk increases with age. What are the signs or symptoms? High blood pressure may not cause symptoms. Very high blood pressure (hypertensive crisis) may cause: Headache. Fast or irregular heartbeats (palpitations). Shortness of breath. Nosebleed. Nausea and vomiting. Vision changes. Severe chest pain, dizziness, and seizures. How is this diagnosed? This condition is diagnosed by measuring your blood pressure while you are seated, with your arm resting on a flat surface, your legs uncrossed, and your feet flat on the floor. The cuff of the blood pressure monitor will be placed directly against the skin of your upper arm at the level of your heart. Blood pressure should be measured at least twice using the same  arm. Certain conditions can cause a difference in blood pressure between your right and left arms. If you have a high blood pressure reading during one visit or you have normal blood pressure with other risk factors, you may be asked to: Return on a different day to have your blood pressure checked again.  Monitor your blood pressure at home for 1 week or longer. If you are diagnosed with hypertension, you may have other blood or imaging tests to help your health care provider understand your overall risk for other conditions. How is this treated? This condition is treated by making healthy lifestyle changes, such as eating healthy foods, exercising more, and reducing your alcohol intake. You may be referred for counseling on a healthy diet and physical activity. Your health care provider may prescribe medicine if lifestyle changes are not enough to get your blood pressure under control and if: Your systolic blood pressure is above 130. Your diastolic blood pressure is above 80. Your personal target blood pressure may vary depending on your medical conditions, your age, and other factors. Follow these instructions at home: Eating and drinking  Eat a diet that is high in fiber and potassium, and low in sodium, added sugar, and fat. An example of this eating plan is called the DASH diet. DASH stands for Dietary Approaches to Stop Hypertension. To eat this way: Eat plenty of fresh fruits and vegetables. Try to fill one half of your plate at each meal with fruits and vegetables. Eat whole grains, such as whole-wheat pasta, brown rice, or whole-grain bread. Fill about one fourth of your plate with whole grains. Eat or drink low-fat dairy products, such as skim milk or low-fat yogurt. Avoid fatty cuts of meat, processed or cured meats, and poultry with skin. Fill about one fourth of your plate with lean proteins, such as fish, chicken without skin, beans, eggs, or tofu. Avoid pre-made and processed  foods. These tend to be higher in sodium, added sugar, and fat. Reduce your daily sodium intake. Many people with hypertension should eat less than 1,500 mg of sodium a day. Do not drink alcohol if: Your health care provider tells you not to drink. You are pregnant, may be pregnant, or are planning to become pregnant. If you drink alcohol: Limit how much you have to: 0-1 drink a day for women. 0-2 drinks a day for men. Know how much alcohol is in your drink. In the [place] one drink equals one 12 oz bottle of beer (355 mL), one 5 oz glass of wine (148 mL), or one 1 oz glass of hard liquor (44 mL). Lifestyle  Work with your health care provider to maintain a healthy body weight or to lose weight. Ask what an ideal weight is for you. Get at least 30 minutes of exercise that causes your heart to beat faster (aerobic exercise) most days of the week. Activities may include walking, swimming, or biking. Include exercise to strengthen your muscles (resistance exercise), such as Pilates or lifting weights, as part of your weekly exercise routine. Try to do these types of exercises for 30 minutes at least 3 days a week. Do not use any products that contain nicotine or tobacco. These products include cigarettes, chewing tobacco, and vaping devices, such as e-cigarettes. If you need help quitting, ask your health care provider. Monitor your blood pressure at home as told by your health care provider. Keep all follow-up visits. This is important. Medicines Take over-the-counter and prescription medicines only as told by your health care provider. Follow directions carefully. Blood pressure medicines must be taken as prescribed. Do not skip doses of blood pressure medicine. Doing this puts you at risk for problems and can make the medicine less effective. Ask your health care provider about side effects or reactions to medicines that you should watch  for. Contact a health care provider if you: Think you are  having a reaction to a medicine you are taking. Have headaches that keep coming back (recurring). Feel dizzy. Have swelling in your ankles. Have trouble with your vision. Get help right away if you: Develop a severe headache or confusion. Have unusual weakness or numbness. Feel faint. Have severe pain in your chest or abdomen. Vomit repeatedly. Have trouble breathing. These symptoms may be an emergency. Get help right away. Call 911. Do not wait to see if the symptoms will go away. Do not drive yourself to the hospital. Summary Hypertension is when the force of blood pumping through your arteries is too strong. If this condition is not controlled, it may put you at risk for serious complications. Your personal target blood pressure may vary depending on your medical conditions, your age, and other factors. For most people, a normal blood pressure is less than 120/80. Hypertension is treated with lifestyle changes, medicines, or a combination of both. Lifestyle changes include losing weight, eating a healthy, low-sodium diet, exercising more, and limiting alcohol. This information is not intended to replace advice given to you by your health care provider. Make sure you discuss any questions you have with your health care provider. Document Revised: 11/14/2020 Document Reviewed: 11/14/2020 Elsevier Patient Education  2023 Elsevier Inc.     Edwina Barth, MD [facility] [place]

## 2022-05-08 NOTE — Assessment & Plan Note (Signed)
Cardiovascular risks associated with dyslipidemia discussed Diet and nutrition discussed Continue rosuvastatin 10 mg daily

## 2022-05-08 NOTE — Assessment & Plan Note (Signed)
Eating better and exercising more with treadmill at home Diet and nutrition discussed Hemoglobin A1c done today

## 2022-05-08 NOTE — Patient Instructions (Signed)
Hypertension, Adult High blood pressure (hypertension) is when the force of blood pumping through the arteries is too strong. The arteries are the blood vessels that carry blood from the heart throughout the body. Hypertension forces the heart to work harder to pump blood and may cause arteries to become narrow or stiff. Untreated or uncontrolled hypertension can lead to a heart attack, heart failure, a stroke, kidney disease, and other problems. A blood pressure reading consists of a higher number over a lower number. Ideally, your blood pressure should be below 120/80. The first ("top") number is called the systolic pressure. It is a measure of the pressure in your arteries as your heart beats. The second ("bottom") number is called the diastolic pressure. It is a measure of the pressure in your arteries as the heart relaxes. What are the causes? The exact cause of this condition is not known. There are some conditions that result in high blood pressure. What increases the risk? Certain factors may make you more likely to develop high blood pressure. Some of these risk factors are under your control, including: Smoking. Not getting enough exercise or physical activity. Being overweight. Having too much fat, sugar, calories, or salt (sodium) in your diet. Drinking too much alcohol. Other risk factors include: Having a personal history of heart disease, diabetes, high cholesterol, or kidney disease. Stress. Having a family history of high blood pressure and high cholesterol. Having obstructive sleep apnea. Age. The risk increases with age. What are the signs or symptoms? High blood pressure may not cause symptoms. Very high blood pressure (hypertensive crisis) may cause: Headache. Fast or irregular heartbeats (palpitations). Shortness of breath. Nosebleed. Nausea and vomiting. Vision changes. Severe chest pain, dizziness, and seizures. How is this diagnosed? This condition is diagnosed by  measuring your blood pressure while you are seated, with your arm resting on a flat surface, your legs uncrossed, and your feet flat on the floor. The cuff of the blood pressure monitor will be placed directly against the skin of your upper arm at the level of your heart. Blood pressure should be measured at least twice using the same arm. Certain conditions can cause a difference in blood pressure between your right and left arms. If you have a high blood pressure reading during one visit or you have normal blood pressure with other risk factors, you may be asked to: Return on a different day to have your blood pressure checked again. Monitor your blood pressure at home for 1 week or longer. If you are diagnosed with hypertension, you may have other blood or imaging tests to help your health care provider understand your overall risk for other conditions. How is this treated? This condition is treated by making healthy lifestyle changes, such as eating healthy foods, exercising more, and reducing your alcohol intake. You may be referred for counseling on a healthy diet and physical activity. Your health care provider may prescribe medicine if lifestyle changes are not enough to get your blood pressure under control and if: Your systolic blood pressure is above 130. Your diastolic blood pressure is above 80. Your personal target blood pressure may vary depending on your medical conditions, your age, and other factors. Follow these instructions at home: Eating and drinking  Eat a diet that is high in fiber and potassium, and low in sodium, added sugar, and fat. An example of this eating plan is called the DASH diet. DASH stands for Dietary Approaches to Stop Hypertension. To eat this way: Eat   plenty of fresh fruits and vegetables. Try to fill one half of your plate at each meal with fruits and vegetables. Eat whole grains, such as whole-wheat pasta, brown rice, or whole-grain bread. Fill about one  fourth of your plate with whole grains. Eat or drink low-fat dairy products, such as skim milk or low-fat yogurt. Avoid fatty cuts of meat, processed or cured meats, and poultry with skin. Fill about one fourth of your plate with lean proteins, such as fish, chicken without skin, beans, eggs, or tofu. Avoid pre-made and processed foods. These tend to be higher in sodium, added sugar, and fat. Reduce your daily sodium intake. Many people with hypertension should eat less than 1,500 mg of sodium a day. Do not drink alcohol if: Your health care provider tells you not to drink. You are pregnant, may be pregnant, or are planning to become pregnant. If you drink alcohol: Limit how much you have to: 0-1 drink a day for women. 0-2 drinks a day for men. Know how much alcohol is in your drink. In the [place] one drink equals one 12 oz bottle of beer (355 mL), one 5 oz glass of wine (148 mL), or one 1 oz glass of hard liquor (44 mL). Lifestyle  Work with your health care provider to maintain a healthy body weight or to lose weight. Ask what an ideal weight is for you. Get at least 30 minutes of exercise that causes your heart to beat faster (aerobic exercise) most days of the week. Activities may include walking, swimming, or biking. Include exercise to strengthen your muscles (resistance exercise), such as Pilates or lifting weights, as part of your weekly exercise routine. Try to do these types of exercises for 30 minutes at least 3 days a week. Do not use any products that contain nicotine or tobacco. These products include cigarettes, chewing tobacco, and vaping devices, such as e-cigarettes. If you need help quitting, ask your health care provider. Monitor your blood pressure at home as told by your health care provider. Keep all follow-up visits. This is important. Medicines Take over-the-counter and prescription medicines only as told by your health care provider. Follow directions carefully. Blood  pressure medicines must be taken as prescribed. Do not skip doses of blood pressure medicine. Doing this puts you at risk for problems and can make the medicine less effective. Ask your health care provider about side effects or reactions to medicines that you should watch for. Contact a health care provider if you: Think you are having a reaction to a medicine you are taking. Have headaches that keep coming back (recurring). Feel dizzy. Have swelling in your ankles. Have trouble with your vision. Get help right away if you: Develop a severe headache or confusion. Have unusual weakness or numbness. Feel faint. Have severe pain in your chest or abdomen. Vomit repeatedly. Have trouble breathing. These symptoms may be an emergency. Get help right away. Call 911. Do not wait to see if the symptoms will go away. Do not drive yourself to the hospital. Summary Hypertension is when the force of blood pumping through your arteries is too strong. If this condition is not controlled, it may put you at risk for serious complications. Your personal target blood pressure may vary depending on your medical conditions, your age, and other factors. For most people, a normal blood pressure is less than 120/80. Hypertension is treated with lifestyle changes, medicines, or a combination of both. Lifestyle changes include losing weight, eating a healthy,   low-sodium diet, exercising more, and limiting alcohol. This information is not intended to replace advice given to you by your health care provider. Make sure you discuss any questions you have with your health care provider. Document Revised: 11/14/2020 Document Reviewed: 11/14/2020 Elsevier Patient Education  2023 Elsevier Inc.  

## 2022-06-04 ENCOUNTER — Ambulatory Visit (INDEPENDENT_AMBULATORY_CARE_PROVIDER_SITE_OTHER): Payer: PPO

## 2022-06-04 VITALS — Ht 61.0 in | Wt 152.0 lb

## 2022-06-04 DIAGNOSIS — Z0000 Encounter for general adult medical examination without abnormal findings: Secondary | ICD-10-CM | POA: Diagnosis not present

## 2022-06-04 NOTE — Patient Instructions (Addendum)
Brenda [name] , Thank you for taking time to come for your Medicare Wellness Visit. I appreciate your ongoing commitment to your health goals. Please review the following plan we discussed and let me know if I can assist you in the future.   These are the goals we discussed:  Goals      Cholesterol Goals: Continue to maintain my health and watch my cholesterol and carbohydrate intake.     The impact of high cholesterol on health Elevated cholesterol levels are one of the risk factors for heart disease, stroke, and peripheral artery disease. The mechanism involving cholesterol in all three diseases is the same; plaque buildup within arteries decreases blood flow affecting the function of the cells and organs that these blood vessels supply.  Atherosclerotic heart disease or narrowed coronary arteries in the heart can cause the symptoms of angina when the heart muscle is not provided with enough oxygen to function. Decreased blood supply to the brain may be due to narrowed small arteries in the brain or because the larger carotid arteries in the neck may become blocked. This can result in a transient ischemic attack (TIA) or stroke. Peripheral artery disease describes the gradual narrowing of the arteries that supply the legs. During exercise, if the legs do not get enough blood supply, they can develop pain, called claudication. Other arteries in the body may also be affected by plaque buildup causing them to narrow, including the mesenteric arteries to the intestine and the renal arteries to the kidney.  Which foods can help lower cholesterol? The American Heart Association has developed diet guidelines to help lower cholesterol levels. It may be a challenge to read the nutritional contents on food packaging and on restaurant menus or to do the math, but the benefit will decrease the risk of heart attack and stroke.  Limit total fat intake to less than 25% to 35% of your total calories each  day. Limit saturated fat intake to less than 7% of total daily calories. Limit trans fat intake to less than 1% of total daily calories. The remaining fat should come from sources of monounsaturated and polyunsaturated fats that are found in unsalted nuts and seeds, fish (especially oily fish, such as salmon, trout, and herring, at least twice per week) and vegetable oils. Limit cholesterol intake to less than 300 mg per day, for most people. If you have coronary heart disease or your LDL cholesterol level is 100 mg/dL or greater, limit your cholesterol intake to less than 200 milligrams a day. Some food groups may be beneficial in directly lowering cholesterol levels and include foods with plant sterol additives, high fiber foods like bran, oatmeal, and fruits like apples and pears, fish, nuts, and olive oil. Some of these foods like nuts and fruits are also high in calories, so moderation is always advisable.        This is a list of the screening recommended for you and due dates:  Health Maintenance  Topic Date Due   Hepatitis C Screening: USPSTF Recommendation to screen - Ages 27-79 yo.  Never done   DTaP/Tdap/Td vaccine (1 - Tdap) Never done   Zoster (Shingles) Vaccine (1 of 2) Never done   Yearly kidney health urinalysis for diabetes  04/01/2018   Complete foot exam   02/16/2020   COVID-19 Vaccine (7 - 2023-24 season) 12/21/2021   Eye exam for diabetics  06/02/2022   DEXA scan (bone density measurement)  11/06/2022*   Mammogram  07/12/2022  Flu Shot  08/22/2022   Hemoglobin A1C  11/07/2022   Yearly kidney function blood test for diabetes  05/08/2023   Medicare Annual Wellness Visit  06/04/2023   Pneumonia Vaccine  Completed   HPV Vaccine  Aged Out   Colon Cancer Screening  Discontinued  *Topic was postponed. The date shown is not the original due date.    Advanced directives: Yes  Conditions/risks identified: Yes  Next appointment: Follow up in one year for your annual  wellness visit.   Preventive Care 46 Years and Older, Female Preventive care refers to lifestyle choices and visits with your health care provider that can promote health and wellness. What does preventive care include? A yearly physical exam. This is also called an annual well check. Dental exams once or twice a year. Routine eye exams. Ask your health care provider how often you should have your eyes checked. Personal lifestyle choices, including: Daily care of your teeth and gums. Regular physical activity. Eating a healthy diet. Avoiding tobacco and drug use. Limiting alcohol use. Practicing safe sex. Taking low-dose aspirin every day. Taking vitamin and mineral supplements as recommended by your health care provider. What happens during an annual well check? The services and screenings done by your health care provider during your annual well check will depend on your age, overall health, lifestyle risk factors, and family history of disease. Counseling  Your health care provider may ask you questions about your: Alcohol use. Tobacco use. Drug use. Emotional well-being. Home and relationship well-being. Sexual activity. Eating habits. History of falls. Memory and ability to understand (cognition). Work and work Astronomer. Reproductive health. Screening  You may have the following tests or measurements: Height, weight, and BMI. Blood pressure. Lipid and cholesterol levels. These may be checked every 5 years, or more frequently if you are over 13 years old. Skin check. Lung cancer screening. You may have this screening every year starting at age 61 if you have a 30-pack-year history of smoking and currently smoke or have quit within the past 15 years. Fecal occult blood test (FOBT) of the stool. You may have this test every year starting at age 43. Flexible sigmoidoscopy or colonoscopy. You may have a sigmoidoscopy every 5 years or a colonoscopy every 10 years starting  at age 44. Hepatitis C blood test. Hepatitis B blood test. Sexually transmitted disease (STD) testing. Diabetes screening. This is done by checking your blood sugar (glucose) after you have not eaten for a while (fasting). You may have this done every 1-3 years. Bone density scan. This is done to screen for osteoporosis. You may have this done starting at age 57. Mammogram. This may be done every 1-2 years. Talk to your health care provider about how often you should have regular mammograms. Talk with your health care provider about your test results, treatment options, and if necessary, the need for more tests. Vaccines  Your health care provider may recommend certain vaccines, such as: Influenza vaccine. This is recommended every year. Tetanus, diphtheria, and acellular pertussis (Tdap, Td) vaccine. You may need a Td booster every 10 years. Zoster vaccine. You may need this after age 69. Pneumococcal 13-valent conjugate (PCV13) vaccine. One dose is recommended after age 6. Pneumococcal polysaccharide (PPSV23) vaccine. One dose is recommended after age 74. Talk to your health care provider about which screenings and vaccines you need and how often you need them. This information is not intended to replace advice given to you by your health care provider. Make  sure you discuss any questions you have with your health care provider. Document Released: 02/03/2015 Document Revised: 09/27/2015 Document Reviewed: 11/08/2014 Elsevier Interactive Patient Education  2017 [employer].  Fall Prevention in the Home Falls can cause injuries. They can happen to people of all ages. There are many things you can do to make your home safe and to help prevent falls. What can I do on the outside of my home? Regularly fix the edges of walkways and driveways and fix any cracks. Remove anything that might make you trip as you walk through a door, such as a raised step or threshold. Trim any bushes or trees on  the path to your home. Use bright outdoor lighting. Clear any walking paths of anything that might make someone trip, such as rocks or tools. Regularly check to see if handrails are loose or broken. Make sure that both sides of any steps have handrails. Any raised decks and porches should have guardrails on the edges. Have any leaves, snow, or ice cleared regularly. Use sand or salt on walking paths during winter. Clean up any spills in your garage right away. This includes oil or grease spills. What can I do in the bathroom? Use night lights. Install grab bars by the toilet and in the tub and shower. Do not use towel bars as grab bars. Use non-skid mats or decals in the tub or shower. If you need to sit down in the shower, use a plastic, non-slip stool. Keep the floor dry. Clean up any water that spills on the floor as soon as it happens. Remove soap buildup in the tub or shower regularly. Attach bath mats securely with double-sided non-slip rug tape. Do not have throw rugs and other things on the floor that can make you trip. What can I do in the bedroom? Use night lights. Make sure that you have a light by your bed that is easy to reach. Do not use any sheets or blankets that are too big for your bed. They should not hang down onto the floor. Have a firm chair that has side arms. You can use this for support while you get dressed. Do not have throw rugs and other things on the floor that can make you trip. What can I do in the kitchen? Clean up any spills right away. Avoid walking on wet floors. Keep items that you use a lot in easy-to-reach places. If you need to reach something above you, use a strong step stool that has a grab bar. Keep electrical cords out of the way. Do not use floor polish or wax that makes floors slippery. If you must use wax, use non-skid floor wax. Do not have throw rugs and other things on the floor that can make you trip. What can I do with my stairs? Do  not leave any items on the stairs. Make sure that there are handrails on both sides of the stairs and use them. Fix handrails that are broken or loose. Make sure that handrails are as long as the stairways. Check any carpeting to make sure that it is firmly attached to the stairs. Fix any carpet that is loose or worn. Avoid having throw rugs at the top or bottom of the stairs. If you do have throw rugs, attach them to the floor with carpet tape. Make sure that you have a light switch at the top of the stairs and the bottom of the stairs. If you do not have them, ask  someone to add them for you. What else can I do to help prevent falls? Wear shoes that: Do not have high heels. Have rubber bottoms. Are comfortable and fit you well. Are closed at the toe. Do not wear sandals. If you use a stepladder: Make sure that it is fully opened. Do not climb a closed stepladder. Make sure that both sides of the stepladder are locked into place. Ask someone to hold it for you, if possible. Clearly mark and make sure that you can see: Any grab bars or handrails. First and last steps. Where the edge of each step is. Use tools that help you move around (mobility aids) if they are needed. These include: Canes. Walkers. Scooters. Crutches. Turn on the lights when you go into a dark area. Replace any light bulbs as soon as they burn out. Set up your furniture so you have a clear path. Avoid moving your furniture around. If any of your floors are uneven, fix them. If there are any pets around you, be aware of where they are. Review your medicines with your doctor. Some medicines can make you feel dizzy. This can increase your chance of falling. Ask your doctor what other things that you can do to help prevent falls. This information is not intended to replace advice given to you by your health care provider. Make sure you discuss any questions you have with your health care provider. Document Released:  11/03/2008 Document Revised: 06/15/2015 Document Reviewed: 02/11/2014 Elsevier Interactive Patient Education  2017 [employer].

## 2022-06-04 NOTE — Progress Notes (Signed)
I connected with  Mathews Argyle on 06/04/22 by a audio enabled telemedicine application and verified that I am speaking with the correct person using two identifiers.  Patient Location: Home  Provider Location: Office/Clinic  I discussed the limitations of evaluation and management by telemedicine. The patient expressed understanding and agreed to proceed.  Subjective:   Brenda [name] is a 77 y.o. female who presents for Medicare Annual (Subsequent) preventive examination.  Review of Systems     Cardiac Risk Factors include: advanced age (>22men, >80 women);dyslipidemia;family history of premature cardiovascular disease;hypertension     Objective:    Today's Vitals   06/04/22 1006  Weight: 152 lb (68.9 kg)  Height: 5\' 1"  (1.549 m)  PainSc: 0-No pain   Body mass index is 28.72 kg/m.     06/04/2022   10:07 AM 06/05/2021    8:50 AM 02/11/2019    8:40 AM 06/14/2016    2:57 PM  Advanced Directives  Does Patient Have a Medical Advance Directive? Yes Yes Yes No  Type of Estate agent of [place];Living will Living will;Healthcare Power of [employer] Power of Attorney   Does patient want to make changes to medical advance directive?  No - Patient declined    Copy of Healthcare Power of Attorney in Chart? No - copy requested No - copy requested No - copy requested   Would patient like information on creating a medical advance directive?    No - Patient declined    Current Medications (verified) Outpatient Encounter Medications as of 06/04/2022  Medication Sig   amLODipine (NORVASC) 10 MG tablet Take 1 tablet by mouth once daily   dorzolamide-timolol (COSOPT) 22.3-6.8 MG/ML ophthalmic solution    fexofenadine (ALLEGRA) 180 MG tablet Take 180 mg by mouth daily.   latanoprost (XALATAN) 0.005 % ophthalmic solution Place 1 drop into both eyes at bedtime.   lisinopril-hydrochlorothiazide (ZESTORETIC) 20-12.5 MG tablet Take 1 tablet by mouth once daily    rosuvastatin (CRESTOR) 10 MG tablet Take 1 tablet (10 mg total) by mouth daily.   No facility-administered encounter medications on file as of 06/04/2022.    Allergies (verified) Patient has no known allergies.   History: Past Medical History:  Diagnosis Date   Allergy    seasonal   Chronic rhinitis 01/19/2007   COLONIC POLYPS, HX OF 06/24/2006   DIVERTICULOSIS, COLON 11/11/2006   HYPERLIPIDEMIA 12/20/2008   HYPERTENSION 06/24/2006   Impaired glucose tolerance    MENOPAUSAL SYNDROME 06/24/2006   Past Surgical History:  Procedure Laterality Date   BREAST BIOPSY     COLONOSCOPY     EYE SURGERY Left 09/302021   EYE SURGERY Right 11/04/2019   POLYPECTOMY     TUBAL LIGATION  1994   benign   Family History  Problem Relation Age of Onset   Mental illness Mother    Cancer Father        lung   Parkinsonism Brother    Colon cancer Neg Hx    Social History   Socioeconomic History   Marital status: Married    Spouse name: Not on file   Number of children: Not on file   Years of education: Not on file   Highest education level: Not on file  Occupational History   Not on file  Tobacco Use   Smoking status: Never   Smokeless tobacco: Never  Vaping Use   Vaping Use: Never used  Substance and Sexual Activity   Alcohol use: No   Drug  use: No   Sexual activity: Not on file  Other Topics Concern   Not on file  Social History Narrative   Not on file   Social Determinants of Health   Financial Resource Strain: Low Risk  (06/04/2022)   Overall Financial Resource Strain (CARDIA)    Difficulty of Paying Living Expenses: Not hard at all  Food Insecurity: No Food Insecurity (06/04/2022)   Hunger Vital Sign    Worried About Running Out of Food in the Last Year: Never true    Ran Out of Food in the Last Year: Never true  Transportation Needs: No Transportation Needs (06/04/2022)   PRAPARE - Administrator, Civil Service (Medical): No    Lack of Transportation  (Non-Medical): No  Physical Activity: Sufficiently Active (06/04/2022)   Exercise Vital Sign    Days of Exercise per Week: 6 days    Minutes of Exercise per Session: 30 min  Stress: No Stress Concern Present (06/04/2022)   [employer] of Occupational Health - Occupational Stress Questionnaire    Feeling of Stress : Not at all  Social Connections: Socially Integrated (06/04/2022)   Social Connection and Isolation Panel [NHANES]    Frequency of Communication with Friends and Family: More than three times a week    Frequency of Social Gatherings with Friends and Family: More than three times a week    Attends Religious Services: More than 4 times per year    Active Member of [employer] or Organizations: Yes    Attends Engineer, structural: More than 4 times per year    Marital Status: Married    Tobacco Counseling Counseling given: Not Answered   Clinical Intake:  Pre-visit preparation completed: Yes  Pain : No/denies pain Pain Score: 0-No pain     BMI - recorded: 28.72 Nutritional Status: BMI 25 -29 Overweight Nutritional Risks: None Diabetes: No  How often do you need to have someone help you when you read instructions, pamphlets, or other written materials from your doctor or pharmacy?: 1 - Never What is the last grade level you completed in school?: HSG  Diabetic? No  Interpreter Needed?: No  Information entered by :: Susie Cassette, LPN.   Activities of Daily Living    06/04/2022   10:11 AM 06/05/2021    8:58 AM  In your present state of health, do you have any difficulty performing the following activities:  Hearing? 0 0  Vision? 0 0  Difficulty concentrating or making decisions? 0 0  Walking or climbing stairs? 0 0  Dressing or bathing? 0 0  Doing errands, shopping? 0 0  Preparing Food and eating ? N N  Using the Toilet? N N  In the past six months, have you accidently leaked urine? N N  Do you have problems with loss of bowel control? N N   Managing your Medications? N N  Managing your Finances? N N  Housekeeping or managing your Housekeeping? N N    Patient Care Team: Georgina Quint, MD as PCP - General (Internal Medicine) Augustin Schooling, MD as Consulting Physician (Ophthalmology)  Indicate any recent Medical Services you may have received from other than [facility] providers in the past year (date may be approximate).     Assessment:   This is a routine wellness examination for Marshayla.  Hearing/Vision screen Hearing Screening - Comments:: Denies hearing difficulties. Vision Screening - Comments:: Wears readers - up to date with routine eye exams with Dr. Fredderick Phenix at  [facility]   Dietary issues and exercise activities discussed: Current Exercise Habits: Home exercise routine, Type of exercise: treadmill, Time (Minutes): 30, Frequency (Times/Week): 6, Weekly Exercise (Minutes/Week): 180, Intensity: Moderate, Exercise limited by: None identified   Goals Addressed             This Visit's Progress    Cholesterol Goals: Continue to maintain my health and watch my cholesterol and carbohydrate intake.       The impact of high cholesterol on health Elevated cholesterol levels are one of the risk factors for heart disease, stroke, and peripheral artery disease. The mechanism involving cholesterol in all three diseases is the same; plaque buildup within arteries decreases blood flow affecting the function of the cells and organs that these blood vessels supply.  Atherosclerotic heart disease or narrowed coronary arteries in the heart can cause the symptoms of angina when the heart muscle is not provided with enough oxygen to function. Decreased blood supply to the brain may be due to narrowed small arteries in the brain or because the larger carotid arteries in the neck may become blocked. This can result in a transient ischemic attack (TIA) or stroke. Peripheral artery disease describes the gradual narrowing of the  arteries that supply the legs. During exercise, if the legs do not get enough blood supply, they can develop pain, called claudication. Other arteries in the body may also be affected by plaque buildup causing them to narrow, including the mesenteric arteries to the intestine and the renal arteries to the kidney.  Which foods can help lower cholesterol? The American Heart Association has developed diet guidelines to help lower cholesterol levels. It may be a challenge to read the nutritional contents on food packaging and on restaurant menus or to do the math, but the benefit will decrease the risk of heart attack and stroke.  Limit total fat intake to less than 25% to 35% of your total calories each day. Limit saturated fat intake to less than 7% of total daily calories. Limit trans fat intake to less than 1% of total daily calories. The remaining fat should come from sources of monounsaturated and polyunsaturated fats that are found in unsalted nuts and seeds, fish (especially oily fish, such as salmon, trout, and herring, at least twice per week) and vegetable oils. Limit cholesterol intake to less than 300 mg per day, for most people. If you have coronary heart disease or your LDL cholesterol level is 100 mg/dL or greater, limit your cholesterol intake to less than 200 milligrams a day. Some food groups may be beneficial in directly lowering cholesterol levels and include foods with plant sterol additives, high fiber foods like bran, oatmeal, and fruits like apples and pears, fish, nuts, and olive oil. Some of these foods like nuts and fruits are also high in calories, so moderation is always advisable.      Depression Screen    06/04/2022   10:11 AM 11/05/2021    8:09 AM 06/05/2021    8:51 AM 11/01/2020    8:12 AM 02/10/2020    8:26 AM 01/18/2020    8:54 AM 08/10/2019    8:18 AM  PHQ 2/9 Scores  PHQ - 2 Score 0 0 0 0 0 0 0  PHQ- 9 Score 0          Fall Risk    06/04/2022   10:08 AM  11/05/2021    8:09 AM 06/05/2021    8:51 AM 11/01/2020    8:12 AM  02/10/2020    8:26 AM  Fall Risk   Falls in the past year? 0 0 0 0 0  Number falls in past yr: 0 0 0 0   Injury with Fall? 0 0 0 0   Risk for fall due to : No Fall Risks No Fall Risks No Fall Risks    Follow up Falls prevention discussed Falls evaluation completed Falls evaluation completed  Falls evaluation completed    FALL RISK PREVENTION PERTAINING TO THE HOME:  Any stairs in or around the home? Yes  If so, are there any without handrails? No  Home free of loose throw rugs in walkways, pet beds, electrical cords, etc? Yes  Adequate lighting in your home to reduce risk of falls? Yes   ASSISTIVE DEVICES UTILIZED TO PREVENT FALLS:  Life alert? No  Use of a cane, walker or w/c? No  Grab bars in the bathroom? Yes  Shower chair or bench in shower? No Elevated toilet seat or a handicapped toilet? Yes   TIMED UP AND GO:  Was the test performed? No . Telephonic Visit  Cognitive Function:        06/04/2022   10:11 AM 06/05/2021    9:00 AM 02/11/2019    8:41 AM  6CIT Screen  What Year? 0 points 0 points 0 points  What month? 0 points  0 points  What time? 0 points 0 points 0 points  Count back from 20 0 points 0 points 0 points  Months in reverse 0 points 0 points 0 points  Repeat phrase 0 points 0 points 0 points  Total Score 0 points  0 points    Immunizations Immunization History  Administered Date(s) Administered   Fluad Quad(high Dose 65+) 10/08/2018, 10/11/2019   Influenza Split 10/03/2011   Influenza Whole 10/28/2007, 10/02/2010   Influenza, High Dose Seasonal PF 10/11/2014, 10/04/2015, 10/18/2016   Influenza,inj,Quad PF,6+ Mos 09/28/2012, 10/10/2013   Influenza-Unspecified 10/14/2017, 09/29/2020, 10/08/2021   Moderna Covid-19 Vaccine Bivalent Booster 69yrs & up 11/21/2020   Moderna Sars-Covid-2 Vaccination 07/01/2019, 07/29/2019, 01/31/2020, 07/05/2020   Pneumococcal Conjugate-13 01/27/2013    Pneumococcal Polysaccharide-23 11/11/2006, 04/11/2014   Unspecified SARS-COV-2 Vaccination 10/26/2021   Zoster, Live 08/17/2012    TDAP status: Due, Education has been provided regarding the importance of this vaccine. Advised may receive this vaccine at local pharmacy or Health Dept. Aware to provide a copy of the vaccination record if obtained from local pharmacy or Health Dept. Verbalized acceptance and understanding.  Flu Vaccine status: Up to date  Pneumococcal vaccine status: Up to date  Covid-19 vaccine status: Completed vaccines  Qualifies for Shingles Vaccine? Yes   Zostavax completed Yes   Shingrix Completed?: No.    Education has been provided regarding the importance of this vaccine. Patient has been advised to call insurance company to determine out of pocket expense if they have not yet received this vaccine. Advised may also receive vaccine at local pharmacy or Health Dept. Verbalized acceptance and understanding.  Screening Tests Health Maintenance  Topic Date Due   Hepatitis C Screening  Never done   DTaP/Tdap/Td (1 - Tdap) Never done   Zoster Vaccines- Shingrix (1 of 2) Never done   Diabetic kidney evaluation - Urine ACR  04/01/2018   FOOT EXAM  02/16/2020   COVID-19 Vaccine (7 - 2023-24 season) 12/21/2021   OPHTHALMOLOGY EXAM  06/02/2022   DEXA SCAN  11/06/2022 (Originally 07/03/2010)   MAMMOGRAM  07/12/2022   INFLUENZA VACCINE  08/22/2022   HEMOGLOBIN  A1C  11/07/2022   Diabetic kidney evaluation - eGFR measurement  05/08/2023   Medicare Annual Wellness (AWV)  06/04/2023   Pneumonia Vaccine 104+ Years old  Completed   HPV VACCINES  Aged Out   COLONOSCOPY (Pts 45-37yrs Insurance coverage will need to be confirmed)  Discontinued    Health Maintenance  Health Maintenance Due  Topic Date Due   Hepatitis C Screening  Never done   DTaP/Tdap/Td (1 - Tdap) Never done   Zoster Vaccines- Shingrix (1 of 2) Never done   Diabetic kidney evaluation - Urine ACR   04/01/2018   FOOT EXAM  02/16/2020   COVID-19 Vaccine (7 - 2023-24 season) 12/21/2021   OPHTHALMOLOGY EXAM  06/02/2022    Colorectal cancer screening: No longer required.   Mammogram status: Completed 07/11/2021. Repeat every year  Bone Density status: Never done  Lung Cancer Screening: (Low Dose CT Chest recommended if Age 78-80 years, 30 pack-year currently smoking OR have quit w/in 15years.) does not qualify.   Lung Cancer Screening Referral: no  Additional Screening:  Hepatitis C Screening: does qualify; Completed: No  Vision Screening: Recommended annual ophthalmology exams for early detection of glaucoma and other disorders of the eye. Is the patient up to date with their annual eye exam?  Yes  Who is the provider or what is the name of the office in which the patient attends annual eye exams? Fredderick Phenix, MD. If pt is not established with a provider, would they like to be referred to a provider to establish care? No .   Dental Screening: Recommended annual dental exams for proper oral hygiene  Community Resource Referral / Chronic Care Management: CRR required this visit?  No   CCM required this visit?  No      Plan:     I have personally reviewed and noted the following in the patient's chart:   Medical and social history Use of alcohol, tobacco or illicit drugs  Current medications and supplements including opioid prescriptions. Patient is not currently taking opioid prescriptions. Functional ability and status Nutritional status Physical activity Advanced directives List of other physicians Hospitalizations, surgeries, and ER visits in previous 12 months Vitals Screenings to include cognitive, depression, and falls Referrals and appointments  In addition, I have reviewed and discussed with patient certain preventive protocols, quality metrics, and best practice recommendations. A written personalized care plan for preventive services as well as general  preventive health recommendations were provided to patient.     Mickeal Needy, LPN   1/61/0960   Nurse Notes:  Normal cognitive status assessed by direct observation via telephone conversation by this Nurse Health Advisor. No abnormalities found.

## 2022-06-07 ENCOUNTER — Ambulatory Visit: Payer: PPO

## 2022-06-07 DIAGNOSIS — H401112 Primary open-angle glaucoma, right eye, moderate stage: Secondary | ICD-10-CM | POA: Diagnosis not present

## 2022-06-07 DIAGNOSIS — H02839 Dermatochalasis of unspecified eye, unspecified eyelid: Secondary | ICD-10-CM | POA: Diagnosis not present

## 2022-06-07 DIAGNOSIS — I10 Essential (primary) hypertension: Secondary | ICD-10-CM | POA: Diagnosis not present

## 2022-06-07 DIAGNOSIS — Z961 Presence of intraocular lens: Secondary | ICD-10-CM | POA: Diagnosis not present

## 2022-06-07 LAB — HM DIABETES EYE EXAM

## 2022-07-17 DIAGNOSIS — Z1231 Encounter for screening mammogram for malignant neoplasm of breast: Secondary | ICD-10-CM | POA: Diagnosis not present

## 2022-07-17 LAB — HM MAMMOGRAPHY

## 2022-09-25 ENCOUNTER — Encounter: Payer: Self-pay | Admitting: Emergency Medicine

## 2022-10-07 ENCOUNTER — Encounter: Payer: Self-pay | Admitting: Emergency Medicine

## 2022-11-06 ENCOUNTER — Ambulatory Visit: Payer: PPO | Admitting: Emergency Medicine

## 2022-11-06 ENCOUNTER — Encounter: Payer: Self-pay | Admitting: Emergency Medicine

## 2022-11-06 VITALS — HR 54 | Temp 98.4°F | Ht 61.0 in | Wt 152.2 lb

## 2022-11-06 DIAGNOSIS — I10 Essential (primary) hypertension: Secondary | ICD-10-CM

## 2022-11-06 DIAGNOSIS — E785 Hyperlipidemia, unspecified: Secondary | ICD-10-CM | POA: Diagnosis not present

## 2022-11-06 DIAGNOSIS — R7303 Prediabetes: Secondary | ICD-10-CM | POA: Diagnosis not present

## 2022-11-06 LAB — CBC WITH DIFFERENTIAL/PLATELET
Basophils Absolute: 0 10*3/uL (ref 0.0–0.1)
Basophils Relative: 0.7 % (ref 0.0–3.0)
Eosinophils Absolute: 0.2 10*3/uL (ref 0.0–0.7)
Eosinophils Relative: 3.6 % (ref 0.0–5.0)
HCT: 44.6 % (ref 36.0–46.0)
Hemoglobin: 14.9 g/dL (ref 12.0–15.0)
Lymphocytes Relative: 40.4 % (ref 12.0–46.0)
Lymphs Abs: 2 10*3/uL (ref 0.7–4.0)
MCHC: 33.4 g/dL (ref 30.0–36.0)
MCV: 90.2 fL (ref 78.0–100.0)
Monocytes Absolute: 0.5 10*3/uL (ref 0.1–1.0)
Monocytes Relative: 9.4 % (ref 3.0–12.0)
Neutro Abs: 2.3 10*3/uL (ref 1.4–7.7)
Neutrophils Relative %: 45.9 % (ref 43.0–77.0)
Platelets: 233 10*3/uL (ref 150.0–400.0)
RBC: 4.94 Mil/uL (ref 3.87–5.11)
RDW: 14 % (ref 11.5–15.5)
WBC: 5 10*3/uL (ref 4.0–10.5)

## 2022-11-06 LAB — COMPREHENSIVE METABOLIC PANEL
ALT: 15 U/L (ref 0–35)
AST: 19 U/L (ref 0–37)
Albumin: 4.7 g/dL (ref 3.5–5.2)
Alkaline Phosphatase: 65 U/L (ref 39–117)
BUN: 14 mg/dL (ref 6–23)
CO2: 34 meq/L — ABNORMAL HIGH (ref 19–32)
Calcium: 10.3 mg/dL (ref 8.4–10.5)
Chloride: 101 meq/L (ref 96–112)
Creatinine, Ser: 0.74 mg/dL (ref 0.40–1.20)
GFR: 78.08 mL/min (ref >60.00)
Glucose, Bld: 130 mg/dL — ABNORMAL HIGH (ref 70–99)
Potassium: 3.5 meq/L (ref 3.5–5.1)
Sodium: 143 meq/L (ref 135–145)
Total Bilirubin: 0.9 mg/dL (ref 0.2–1.2)
Total Protein: 7.6 g/dL (ref 6.0–8.3)

## 2022-11-06 LAB — LIPID PANEL
Cholesterol: 110 mg/dL (ref 0–200)
HDL: 45.1 mg/dL (ref >39.00)
LDL Cholesterol: 47 mg/dL (ref 0–99)
NonHDL: 64.57
Total CHOL/HDL Ratio: 2
Triglycerides: 90 mg/dL (ref 0.0–149.0)
VLDL: 18 mg/dL (ref 0.0–40.0)

## 2022-11-06 LAB — HEMOGLOBIN A1C: Hgb A1c MFr Bld: 6.6 % — ABNORMAL HIGH (ref 4.6–6.5)

## 2022-11-06 MED ORDER — ROSUVASTATIN CALCIUM 10 MG PO TABS: 10.0000 mg | ORAL_TABLET | Freq: Every day | ORAL | 3 refills | Status: DC

## 2022-11-06 NOTE — Patient Instructions (Signed)
Health Maintenance After Age 77 After age 77, you are at a higher risk for certain long-term diseases and infections as well as injuries from falls. Falls are a major cause of broken bones and head injuries in people who are older than age 77. Getting regular preventive care can help to keep you healthy and well. Preventive care includes getting regular testing and making lifestyle changes as recommended by your health care provider. Talk with your health care provider about: Which screenings and tests you should have. A screening is a test that checks for a disease when you have no symptoms. A diet and exercise plan that is right for you. What should I know about screenings and tests to prevent falls? Screening and testing are the best ways to find a health problem early. Early diagnosis and treatment give you the best chance of managing medical conditions that are common after age 77. Certain conditions and lifestyle choices may make you more likely to have a fall. Your health care provider may recommend: Regular vision checks. Poor vision and conditions such as cataracts can make you more likely to have a fall. If you wear glasses, make sure to get your prescription updated if your vision changes. Medicine review. Work with your health care provider to regularly review all of the medicines you are taking, including over-the-counter medicines. Ask your health care provider about any side effects that may make you more likely to have a fall. Tell your health care provider if any medicines that you take make you feel dizzy or sleepy. Strength and balance checks. Your health care provider may recommend certain tests to check your strength and balance while standing, walking, or changing positions. Foot health exam. Foot pain and numbness, as well as not wearing proper footwear, can make you more likely to have a fall. Screenings, including: Osteoporosis screening. Osteoporosis is a condition that causes  the bones to get weaker and break more easily. Blood pressure screening. Blood pressure changes and medicines to control blood pressure can make you feel dizzy. Depression screening. You may be more likely to have a fall if you have a fear of falling, feel depressed, or feel unable to do activities that you used to do. Alcohol use screening. Using too much alcohol can affect your balance and may make you more likely to have a fall. Follow these instructions at home: Lifestyle Do not drink alcohol if: Your health care provider tells you not to drink. If you drink alcohol: Limit how much you have to: 0-1 drink a day for women. 0-2 drinks a day for men. Know how much alcohol is in your drink. In the [place] one drink equals one 12 oz bottle of beer (355 mL), one 5 oz glass of wine (148 mL), or one 1 oz glass of hard liquor (44 mL). Do not use any products that contain nicotine or tobacco. These products include cigarettes, chewing tobacco, and vaping devices, such as e-cigarettes. If you need help quitting, ask your health care provider. Activity  Follow a regular exercise program to stay fit. This will help you maintain your balance. Ask your health care provider what types of exercise are appropriate for you. If you need a cane or walker, use it as recommended by your health care provider. Wear supportive shoes that have nonskid soles. Safety  Remove any tripping hazards, such as rugs, cords, and clutter. Install safety equipment such as grab bars in bathrooms and safety rails on stairs. Keep rooms and walkways   well-lit. General instructions Talk with your health care provider about your risks for falling. Tell your health care provider if: You fall. Be sure to tell your health care provider about all falls, even ones that seem minor. You feel dizzy, tiredness (fatigue), or off-balance. Take over-the-counter and prescription medicines only as told by your health care provider. These include  supplements. Eat a healthy diet and maintain a healthy weight. A healthy diet includes low-fat dairy products, low-fat (lean) meats, and fiber from whole grains, beans, and lots of fruits and vegetables. Stay current with your vaccines. Schedule regular health, dental, and eye exams. Summary Having a healthy lifestyle and getting preventive care can help to protect your health and wellness after age 77. Screening and testing are the best way to find a health problem early and help you avoid having a fall. Early diagnosis and treatment give you the best chance for managing medical conditions that are more common for people who are older than age 77. Falls are a major cause of broken bones and head injuries in people who are older than age 77. Take precautions to prevent a fall at home. Work with your health care provider to learn what changes you can make to improve your health and wellness and to prevent falls. This information is not intended to replace advice given to you by your health care provider. Make sure you discuss any questions you have with your health care provider. Document Revised: 05/29/2020 Document Reviewed: 05/29/2020 Elsevier Patient Education  2024 Elsevier Inc.  

## 2022-11-06 NOTE — Assessment & Plan Note (Signed)
Cardiovascular risks associated with dyslipidemia discussed Diet and nutrition discussed Lipid profile done today Continue rosuvastatin 10 mg daily Follow-up in 6 months

## 2022-11-06 NOTE — Assessment & Plan Note (Signed)
Well-controlled hypertension Continue amlodipine 10 mg daily and Zestoretic 20-12.5 mg daily Cardiovascular risks associated with hypertension discussed Benefits of exercise discussed Diet and nutrition discussed Blood work done today Follow-up in 6 months

## 2022-11-06 NOTE — Assessment & Plan Note (Signed)
Diet and nutrition discussed Blood work done today including hemoglobin A1c Benefits of exercise discussed

## 2022-11-06 NOTE — Progress Notes (Signed)
KRISTIN BARCUS 77 y.o.   Chief Complaint  Patient presents with   Medical Management of Chronic Issues    f/u appt, no concerns     HISTORY OF PRESENT ILLNESS: This is a 77 y.o. female here for 74-month follow-up of chronic medical conditions including hypertension, prediabetes, and dyslipidemia. Overall doing well.  Has no complaints or medical concerns today. BP Readings from Last 3 Encounters:  05/08/22 134/72  11/05/21 132/86  05/03/21 130/76   Wt Readings from Last 3 Encounters:  11/06/22 152 lb 4 oz (69.1 kg)  06/04/22 152 lb (68.9 kg)  05/08/22 151 lb (68.5 kg)     HPI   Prior to Admission medications   Medication Sig Start Date End Date Taking? Authorizing Provider  amLODipine (NORVASC) 10 MG tablet Take 1 tablet by mouth once daily 01/01/22  Yes Lavena Loretto, Eilleen Kempf, MD  dorzolamide-timolol (COSOPT) 22.3-6.8 MG/ML ophthalmic solution  02/25/17  Yes [provider]  fexofenadine (ALLEGRA) 180 MG tablet Take 180 mg by mouth daily.   Yes [provider]  latanoprost (XALATAN) 0.005 % ophthalmic solution Place 1 drop into both eyes at bedtime. 05/02/16  Yes [provider]  lisinopril-hydrochlorothiazide (ZESTORETIC) 20-12.5 MG tablet Take 1 tablet by mouth once daily 01/01/22  Yes Thiago Ragsdale, Eilleen Kempf, MD  rosuvastatin (CRESTOR) 10 MG tablet Take 1 tablet (10 mg total) by mouth daily. 11/06/22 11/01/23  Georgina Quint, MD    No Known Allergies  Patient Active Problem List   Diagnosis Date Noted   Prediabetes 02/16/2019   Dyslipidemia 12/20/2008   Diverticulosis of colon 11/11/2006   Hypertension, essential 06/24/2006   History of colonic polyps 06/24/2006    Past Medical History:  Diagnosis Date   Allergy    seasonal   Chronic rhinitis 01/19/2007   COLONIC POLYPS, HX OF 06/24/2006   DIVERTICULOSIS, COLON 11/11/2006   HYPERLIPIDEMIA 12/20/2008   HYPERTENSION 06/24/2006   Impaired glucose tolerance    MENOPAUSAL  SYNDROME 06/24/2006    Past Surgical History:  Procedure Laterality Date   BREAST BIOPSY     COLONOSCOPY     EYE SURGERY Left 09/302021   EYE SURGERY Right 11/04/2019   POLYPECTOMY     TUBAL LIGATION  1994   benign    Social History   Socioeconomic History   Marital status: Married    Spouse name: Not on file   Number of children: Not on file   Years of education: Not on file   Highest education level: 12th grade  Occupational History   Not on file  Tobacco Use   Smoking status: Never   Smokeless tobacco: Never  Vaping Use   Vaping status: Never Used  Substance and Sexual Activity   Alcohol use: No   Drug use: No   Sexual activity: Not on file  Other Topics Concern   Not on file  Social History Narrative   Not on file   Social Determinants of Health   Financial Resource Strain: Low Risk  (11/02/2022)   Overall Financial Resource Strain (CARDIA)    Difficulty of Paying Living Expenses: Not hard at all  Food Insecurity: No Food Insecurity (11/02/2022)   Hunger Vital Sign    Worried About Radiation protection practitioner of Food in the Last Year: Never true    Ran Out of Food in the Last Year: Never true  Transportation Needs: No Transportation Needs (11/02/2022)   PRAPARE - Administrator, Civil Service (Medical): No  Lack of Transportation (Non-Medical): No  Physical Activity: Sufficiently Active (11/02/2022)   Exercise Vital Sign    Days of Exercise per Week: 6 days    Minutes of Exercise per Session: 50 min  Stress: No Stress Concern Present (11/02/2022)   [employer] of Occupational Health - Occupational Stress Questionnaire    Feeling of Stress : Not at all  Social Connections: Moderately Integrated (11/02/2022)   Social Connection and Isolation Panel [NHANES]    Frequency of Communication with Friends and Family: Twice a week    Frequency of Social Gatherings with Friends and Family: Once a week    Attends Religious Services: Never    Doctor, general practice or Organizations: No    Attends Engineer, structural: More than 4 times per year    Marital Status: Married  Catering manager Violence: Not At Risk (06/04/2022)   Humiliation, Afraid, Rape, and Kick questionnaire    Fear of Current or Ex-Partner: No    Emotionally Abused: No    Physically Abused: No    Sexually Abused: No    Family History  Problem Relation Age of Onset   Mental illness Mother    Cancer Father        lung   Parkinsonism Brother    Colon cancer Neg Hx      Review of Systems  Constitutional: Negative.  Negative for chills and fever.  HENT: Negative.  Negative for congestion and sore throat.   Respiratory: Negative.  Negative for cough and shortness of breath.   Cardiovascular: Negative.  Negative for chest pain and palpitations.  Gastrointestinal:  Negative for abdominal pain, diarrhea, nausea and vomiting.  Genitourinary: Negative.   Skin: Negative.  Negative for rash.  Neurological: Negative.  Negative for dizziness and headaches.  All other systems reviewed and are negative.   Today's Vitals   11/06/22 0802  Pulse: (!) 54  Temp: 98.4 F (36.9 C)  TempSrc: Oral  SpO2: 97%  Weight: 152 lb 4 oz (69.1 kg)  Height: 5\' 1"  (1.549 m)   Body mass index is 28.77 kg/m.   Physical Exam Vitals reviewed.  Constitutional:      Appearance: Normal appearance.  HENT:     Head: Normocephalic.     Mouth/Throat:     Mouth: Mucous membranes are moist.     Pharynx: Oropharynx is clear.  Eyes:     Extraocular Movements: Extraocular movements intact.     Conjunctiva/sclera: Conjunctivae normal.     Pupils: Pupils are equal, round, and reactive to light.  Cardiovascular:     Rate and Rhythm: Normal rate and regular rhythm.     Pulses: Normal pulses.     Heart sounds: Normal heart sounds.  Pulmonary:     Effort: Pulmonary effort is normal.     Breath sounds: Normal breath sounds.  Musculoskeletal:     Cervical back: No tenderness.   Lymphadenopathy:     Cervical: No cervical adenopathy.  Skin:    General: Skin is warm and dry.     Capillary Refill: Capillary refill takes less than 2 seconds.  Neurological:     General: No focal deficit present.     Mental Status: She is alert and oriented to person, place, and time.  Psychiatric:        Mood and Affect: Mood normal.        Behavior: Behavior normal.      ASSESSMENT & PLAN: A total of 45 minutes was spent with  the patient and counseling/coordination of care regarding preparing for this visit, review of most recent office visit notes, review of multiple chronic medical conditions under management, review of most recent blood work results, review of all medications, cardiovascular risks associated with hypertension and dyslipidemia, education on nutrition, review of health maintenance items, prognosis, documentation and need for follow-up.  Problem List Items Addressed This Visit       Cardiovascular and Mediastinum   Hypertension, essential - Primary    Well-controlled hypertension Continue amlodipine 10 mg daily and Zestoretic 20-12.5 mg daily Cardiovascular risks associated with hypertension discussed Benefits of exercise discussed Diet and nutrition discussed Blood work done today Follow-up in 6 months      Relevant Medications   rosuvastatin (CRESTOR) 10 MG tablet   Other Relevant Orders   CBC with Differential/Platelet   Comprehensive metabolic panel   Hemoglobin A1c   Lipid panel     Other   Dyslipidemia    Cardiovascular risks associated with dyslipidemia discussed Diet and nutrition discussed Lipid profile done today Continue rosuvastatin 10 mg daily Follow-up in 6 months      Relevant Medications   rosuvastatin (CRESTOR) 10 MG tablet   Other Relevant Orders   CBC with Differential/Platelet   Comprehensive metabolic panel   Hemoglobin A1c   Lipid panel   Prediabetes    Diet and nutrition discussed Blood work done today including  hemoglobin A1c Benefits of exercise discussed      Relevant Orders   CBC with Differential/Platelet   Comprehensive metabolic panel   Hemoglobin A1c   Lipid panel   Patient Instructions  Health Maintenance After Age 87 After age 29, you are at a higher risk for certain long-term diseases and infections as well as injuries from falls. Falls are a major cause of broken bones and head injuries in people who are older than age 50. Getting regular preventive care can help to keep you healthy and well. Preventive care includes getting regular testing and making lifestyle changes as recommended by your health care provider. Talk with your health care provider about: Which screenings and tests you should have. A screening is a test that checks for a disease when you have no symptoms. A diet and exercise plan that is right for you. What should I know about screenings and tests to prevent falls? Screening and testing are the best ways to find a health problem early. Early diagnosis and treatment give you the best chance of managing medical conditions that are common after age 103. Certain conditions and lifestyle choices may make you more likely to have a fall. Your health care provider may recommend: Regular vision checks. Poor vision and conditions such as cataracts can make you more likely to have a fall. If you wear glasses, make sure to get your prescription updated if your vision changes. Medicine review. Work with your health care provider to regularly review all of the medicines you are taking, including over-the-counter medicines. Ask your health care provider about any side effects that may make you more likely to have a fall. Tell your health care provider if any medicines that you take make you feel dizzy or sleepy. Strength and balance checks. Your health care provider may recommend certain tests to check your strength and balance while standing, walking, or changing positions. Foot health  exam. Foot pain and numbness, as well as not wearing proper footwear, can make you more likely to have a fall. Screenings, including: Osteoporosis screening. Osteoporosis is a condition  that causes the bones to get weaker and break more easily. Blood pressure screening. Blood pressure changes and medicines to control blood pressure can make you feel dizzy. Depression screening. You may be more likely to have a fall if you have a fear of falling, feel depressed, or feel unable to do activities that you used to do. Alcohol use screening. Using too much alcohol can affect your balance and may make you more likely to have a fall. Follow these instructions at home: Lifestyle Do not drink alcohol if: Your health care provider tells you not to drink. If you drink alcohol: Limit how much you have to: 0-1 drink a day for women. 0-2 drinks a day for men. Know how much alcohol is in your drink. In the [place] one drink equals one 12 oz bottle of beer (355 mL), one 5 oz glass of wine (148 mL), or one 1 oz glass of hard liquor (44 mL). Do not use any products that contain nicotine or tobacco. These products include cigarettes, chewing tobacco, and vaping devices, such as e-cigarettes. If you need help quitting, ask your health care provider. Activity  Follow a regular exercise program to stay fit. This will help you maintain your balance. Ask your health care provider what types of exercise are appropriate for you. If you need a cane or walker, use it as recommended by your health care provider. Wear supportive shoes that have nonskid soles. Safety  Remove any tripping hazards, such as rugs, cords, and clutter. Install safety equipment such as grab bars in bathrooms and safety rails on stairs. Keep rooms and walkways well-lit. General instructions Talk with your health care provider about your risks for falling. Tell your health care provider if: You fall. Be sure to tell your health care provider  about all falls, even ones that seem minor. You feel dizzy, tiredness (fatigue), or off-balance. Take over-the-counter and prescription medicines only as told by your health care provider. These include supplements. Eat a healthy diet and maintain a healthy weight. A healthy diet includes low-fat dairy products, low-fat (lean) meats, and fiber from whole grains, beans, and lots of fruits and vegetables. Stay current with your vaccines. Schedule regular health, dental, and eye exams. Summary Having a healthy lifestyle and getting preventive care can help to protect your health and wellness after age 57. Screening and testing are the best way to find a health problem early and help you avoid having a fall. Early diagnosis and treatment give you the best chance for managing medical conditions that are more common for people who are older than age 2. Falls are a major cause of broken bones and head injuries in people who are older than age 40. Take precautions to prevent a fall at home. Work with your health care provider to learn what changes you can make to improve your health and wellness and to prevent falls. This information is not intended to replace advice given to you by your health care provider. Make sure you discuss any questions you have with your health care provider. Document Revised: 05/29/2020 Document Reviewed: 05/29/2020 Elsevier Patient Education  2024 [employer]      Edwina Barth, MD [facility]

## 2022-12-05 ENCOUNTER — Encounter: Payer: Self-pay | Admitting: Emergency Medicine

## 2022-12-08 NOTE — Telephone Encounter (Signed)
Recommend to continue taking rosuvastatin 10 mg daily. Thanks.

## 2022-12-16 ENCOUNTER — Other Ambulatory Visit: Payer: Self-pay | Admitting: Emergency Medicine

## 2022-12-16 DIAGNOSIS — I10 Essential (primary) hypertension: Secondary | ICD-10-CM

## 2023-01-23 ENCOUNTER — Encounter: Payer: Self-pay | Admitting: Emergency Medicine

## 2023-01-24 ENCOUNTER — Other Ambulatory Visit: Payer: Self-pay | Admitting: Radiology

## 2023-01-24 DIAGNOSIS — E785 Hyperlipidemia, unspecified: Secondary | ICD-10-CM

## 2023-01-24 DIAGNOSIS — I10 Essential (primary) hypertension: Secondary | ICD-10-CM

## 2023-03-11 ENCOUNTER — Other Ambulatory Visit: Payer: Self-pay | Admitting: Emergency Medicine

## 2023-03-11 DIAGNOSIS — I10 Essential (primary) hypertension: Secondary | ICD-10-CM

## 2023-05-07 ENCOUNTER — Encounter: Payer: Self-pay | Admitting: Emergency Medicine

## 2023-05-07 ENCOUNTER — Ambulatory Visit (INDEPENDENT_AMBULATORY_CARE_PROVIDER_SITE_OTHER): Payer: PPO | Admitting: Emergency Medicine

## 2023-05-07 VITALS — BP 110/74 | HR 65 | Temp 97.9°F | Ht 61.0 in | Wt 148.0 lb

## 2023-05-07 DIAGNOSIS — I10 Essential (primary) hypertension: Secondary | ICD-10-CM | POA: Diagnosis not present

## 2023-05-07 DIAGNOSIS — E785 Hyperlipidemia, unspecified: Secondary | ICD-10-CM

## 2023-05-07 DIAGNOSIS — R7303 Prediabetes: Secondary | ICD-10-CM | POA: Diagnosis not present

## 2023-05-07 LAB — COMPREHENSIVE METABOLIC PANEL WITH GFR
ALT: 13 U/L (ref 0–35)
AST: 19 U/L (ref 0–37)
Albumin: 4.8 g/dL (ref 3.5–5.2)
Alkaline Phosphatase: 63 U/L (ref 39–117)
BUN: 10 mg/dL (ref 6–23)
CO2: 32 meq/L (ref 19–32)
Calcium: 9.8 mg/dL (ref 8.4–10.5)
Chloride: 100 meq/L (ref 96–112)
Creatinine, Ser: 0.74 mg/dL (ref 0.40–1.20)
GFR: 77.81 mL/min (ref >60.00)
Glucose, Bld: 118 mg/dL — ABNORMAL HIGH (ref 70–99)
Potassium: 3.2 meq/L — ABNORMAL LOW (ref 3.5–5.1)
Sodium: 141 meq/L (ref 135–145)
Total Bilirubin: 0.8 mg/dL (ref 0.2–1.2)
Total Protein: 7.3 g/dL (ref 6.0–8.3)

## 2023-05-07 LAB — MICROALBUMIN / CREATININE URINE RATIO
Creatinine,U: 26.7 mg/dL
Microalb Creat Ratio: 35.5 mg/g — ABNORMAL HIGH (ref 0.0–30.0)
Microalb, Ur: 0.9 mg/dL (ref 0.0–1.9)

## 2023-05-07 LAB — CBC WITH DIFFERENTIAL/PLATELET
Basophils Absolute: 0 10*3/uL (ref 0.0–0.1)
Basophils Relative: 1 % (ref 0.0–3.0)
Eosinophils Absolute: 0.1 10*3/uL (ref 0.0–0.7)
Eosinophils Relative: 3.3 % (ref 0.0–5.0)
HCT: 42.4 % (ref 36.0–46.0)
Hemoglobin: 14.5 g/dL (ref 12.0–15.0)
Lymphocytes Relative: 35.9 % (ref 12.0–46.0)
Lymphs Abs: 1.6 10*3/uL (ref 0.7–4.0)
MCHC: 34.1 g/dL (ref 30.0–36.0)
MCV: 89.5 fl (ref 78.0–100.0)
Monocytes Absolute: 0.4 10*3/uL (ref 0.1–1.0)
Monocytes Relative: 9.5 % (ref 3.0–12.0)
Neutro Abs: 2.2 10*3/uL (ref 1.4–7.7)
Neutrophils Relative %: 50.3 % (ref 43.0–77.0)
Platelets: 216 10*3/uL (ref 150.0–400.0)
RBC: 4.74 Mil/uL (ref 3.87–5.11)
RDW: 14.8 % (ref 11.5–15.5)
WBC: 4.3 10*3/uL (ref 4.0–10.5)

## 2023-05-07 LAB — LIPID PANEL
Cholesterol: 108 mg/dL (ref 0–200)
HDL: 45.5 mg/dL (ref >39.00)
LDL Cholesterol: 47 mg/dL (ref 0–99)
NonHDL: 62.89
Total CHOL/HDL Ratio: 2
Triglycerides: 79 mg/dL (ref 0.0–149.0)
VLDL: 15.8 mg/dL (ref 0.0–40.0)

## 2023-05-07 LAB — HEMOGLOBIN A1C: Hgb A1c MFr Bld: 6.6 % — ABNORMAL HIGH (ref 4.6–6.5)

## 2023-05-07 NOTE — Progress Notes (Signed)
 Brenda [name] 78 y.o.   Chief Complaint  Patient presents with   Medical Management of Chronic Issues    6 month f/u    HISTORY OF PRESENT ILLNESS: This is a 78 y.o. female here for 29-month follow-up of chronic medical conditions Overall doing well.  Has no complaints or medical concerns today.  HPI   Prior to Admission medications   Medication Sig Start Date End Date Taking? Authorizing Provider  amLODipine (NORVASC) 10 MG tablet Take 1 tablet by mouth once daily 03/11/23  Yes Arben Packman, Eilleen Kempf, MD  dorzolamide-timolol (COSOPT) 22.3-6.8 MG/ML ophthalmic solution  02/25/17  Yes [provider]  fexofenadine (ALLEGRA) 180 MG tablet Take 180 mg by mouth daily.   Yes [provider]  latanoprost (XALATAN) 0.005 % ophthalmic solution Place 1 drop into both eyes at bedtime. 05/02/16  Yes [provider]  lisinopril-hydrochlorothiazide (ZESTORETIC) 20-12.5 MG tablet Take 1 tablet by mouth once daily 03/11/23  Yes [place] Landgrebe, Eilleen Kempf, MD  rosuvastatin (CRESTOR) 10 MG tablet Take 1 tablet (10 mg total) by mouth daily. 11/06/22 11/01/23 Yes Georgina Quint, MD    No Known Allergies  Patient Active Problem List   Diagnosis Date Noted   Prediabetes 02/16/2019   Dyslipidemia 12/20/2008   Diverticulosis of colon 11/11/2006   Hypertension, essential 06/24/2006   History of colonic polyps 06/24/2006    Past Medical History:  Diagnosis Date   Allergy    seasonal   Chronic rhinitis 01/19/2007   COLONIC POLYPS, HX OF 06/24/2006   DIVERTICULOSIS, COLON 11/11/2006   HYPERLIPIDEMIA 12/20/2008   HYPERTENSION 06/24/2006   Impaired glucose tolerance    MENOPAUSAL SYNDROME 06/24/2006    Past Surgical History:  Procedure Laterality Date   BREAST BIOPSY     COLONOSCOPY     EYE SURGERY Left 09/302021   EYE SURGERY Right 11/04/2019   POLYPECTOMY     TUBAL LIGATION  1994   benign    Social History   Socioeconomic History   Marital status: Married     Spouse name: Not on file   Number of children: Not on file   Years of education: Not on file   Highest education level: 12th grade  Occupational History   Not on file  Tobacco Use   Smoking status: Never   Smokeless tobacco: Never  Vaping Use   Vaping status: Never Used  Substance and Sexual Activity   Alcohol use: No   Drug use: No   Sexual activity: Not on file  Other Topics Concern   Not on file  Social History Narrative   Not on file   Social Drivers of Health   Financial Resource Strain: Low Risk  (11/02/2022)   Overall Financial Resource Strain (CARDIA)    Difficulty of Paying Living Expenses: Not hard at all  Food Insecurity: No Food Insecurity (11/02/2022)   Hunger Vital Sign    Worried About Radiation protection practitioner of Food in the Last Year: Never true    Ran Out of Food in the Last Year: Never true  Transportation Needs: No Transportation Needs (05/03/2023)   PRAPARE - Administrator, Civil Service (Medical): No    Lack of Transportation (Non-Medical): No  Physical Activity: Sufficiently Active (11/02/2022)   Exercise Vital Sign    Days of Exercise per Week: 6 days    Minutes of Exercise per Session: 50 min  Stress: No Stress Concern Present (11/02/2022)   [employer] of Occupational Health - Occupational Stress Questionnaire  Feeling of Stress : Not at all  Social Connections: Moderately Integrated (11/02/2022)   Social Connection and Isolation Panel [NHANES]    Frequency of Communication with Friends and Family: Twice a week    Frequency of Social Gatherings with Friends and Family: Once a week    Attends Religious Services: Never    Database administrator or Organizations: No    Attends Engineer, structural: More than 4 times per year    Marital Status: Married  Catering manager Violence: Not At Risk (06/04/2022)   Humiliation, Afraid, Rape, and Kick questionnaire    Fear of Current or Ex-Partner: No    Emotionally Abused: No    Physically  Abused: No    Sexually Abused: No    Family History  Problem Relation Age of Onset   Mental illness Mother    Cancer Father        lung   Parkinsonism Brother    Colon cancer Neg Hx      Review of Systems  Constitutional: Negative.  Negative for chills and fever.  HENT: Negative.  Negative for congestion and sore throat.   Respiratory: Negative.  Negative for cough and shortness of breath.   Cardiovascular: Negative.  Negative for chest pain and palpitations.  Gastrointestinal:  Negative for abdominal pain, diarrhea, nausea and vomiting.  Genitourinary: Negative.  Negative for dysuria.  Skin: Negative.  Negative for rash.  Neurological: Negative.  Negative for dizziness and headaches.  All other systems reviewed and are negative.   Vitals:   05/07/23 0754  BP: 110/74  Pulse: 65  Temp: 97.9 F (36.6 C)  SpO2: 96%    Physical Exam Vitals reviewed.  Constitutional:      Appearance: Normal appearance.  HENT:     Head: Normocephalic.     Mouth/Throat:     Mouth: Mucous membranes are moist.     Pharynx: Oropharynx is clear.  Eyes:     Extraocular Movements: Extraocular movements intact.     Pupils: Pupils are equal, round, and reactive to light.  Cardiovascular:     Rate and Rhythm: Normal rate and regular rhythm.     Pulses: Normal pulses.     Heart sounds: Normal heart sounds.  Pulmonary:     Effort: Pulmonary effort is normal.     Breath sounds: Normal breath sounds.  Musculoskeletal:     Cervical back: No tenderness.  Lymphadenopathy:     Cervical: No cervical adenopathy.  Skin:    General: Skin is warm and dry.     Capillary Refill: Capillary refill takes less than 2 seconds.  Neurological:     General: No focal deficit present.     Mental Status: She is alert and oriented to person, place, and time.  Psychiatric:        Mood and Affect: Mood normal.        Behavior: Behavior normal.      ASSESSMENT & PLAN: A total of 45 minutes was spent with  the patient and counseling/coordination of care regarding preparing for this visit, review of most recent office visit notes, review of multiple chronic medical conditions and their management, review of all medications, review of most recent bloodwork results, review of health maintenance items, education on nutrition, prognosis, documentation, and need for follow up.   Problem List Items Addressed This Visit       Cardiovascular and Mediastinum   Hypertension, essential - Primary   BP Readings from Last 3 Encounters:  05/07/23 110/74  05/08/22 134/72  11/05/21 132/86  Well-controlled hypertension Continue amlodipine 10 mg daily and Zestoretic 20-12.5 mg daily Cardiovascular risks associated with hypertension discussed Benefits of exercise discussed Diet and nutrition discussed Blood work done today Follow-up in 6 months       Relevant Orders   CBC with Differential/Platelet   Comprehensive metabolic panel with GFR   Hemoglobin A1c   Lipid panel   Microalbumin / creatinine urine ratio     Other   Dyslipidemia   Cardiovascular risks associated with dyslipidemia discussed Diet and nutrition discussed Lipid profile done today Continue rosuvastatin 10 mg daily Follow-up in 6 months      Relevant Orders   CBC with Differential/Platelet   Comprehensive metabolic panel with GFR   Hemoglobin A1c   Lipid panel   Microalbumin / creatinine urine ratio   Prediabetes   Diet and nutrition discussed Blood work done today including hemoglobin A1c Benefits of exercise discussed      Relevant Orders   CBC with Differential/Platelet   Comprehensive metabolic panel with GFR   Hemoglobin A1c   Lipid panel   Microalbumin / creatinine urine ratio   Patient Instructions  Health Maintenance After Age 65 After age 8, you are at a higher risk for certain long-term diseases and infections as well as injuries from falls. Falls are a major cause of broken bones and head injuries in  people who are older than age 63. Getting regular preventive care can help to keep you healthy and well. Preventive care includes getting regular testing and making lifestyle changes as recommended by your health care provider. Talk with your health care provider about: Which screenings and tests you should have. A screening is a test that checks for a disease when you have no symptoms. A diet and exercise plan that is right for you. What should I know about screenings and tests to prevent falls? Screening and testing are the best ways to find a health problem early. Early diagnosis and treatment give you the best chance of managing medical conditions that are common after age 66. Certain conditions and lifestyle choices may make you more likely to have a fall. Your health care provider may recommend: Regular vision checks. Poor vision and conditions such as cataracts can make you more likely to have a fall. If you wear glasses, make sure to get your prescription updated if your vision changes. Medicine review. Work with your health care provider to regularly review all of the medicines you are taking, including over-the-counter medicines. Ask your health care provider about any side effects that may make you more likely to have a fall. Tell your health care provider if any medicines that you take make you feel dizzy or sleepy. Strength and balance checks. Your health care provider may recommend certain tests to check your strength and balance while standing, walking, or changing positions. Foot health exam. Foot pain and numbness, as well as not wearing proper footwear, can make you more likely to have a fall. Screenings, including: Osteoporosis screening. Osteoporosis is a condition that causes the bones to get weaker and break more easily. Blood pressure screening. Blood pressure changes and medicines to control blood pressure can make you feel dizzy. Depression screening. You may be more likely to  have a fall if you have a fear of falling, feel depressed, or feel unable to do activities that you used to do. Alcohol use screening. Using too much alcohol can affect your balance and may  make you more likely to have a fall. Follow these instructions at home: Lifestyle Do not drink alcohol if: Your health care provider tells you not to drink. If you drink alcohol: Limit how much you have to: 0-1 drink a day for women. 0-2 drinks a day for men. Know how much alcohol is in your drink. In the [place] one drink equals one 12 oz bottle of beer (355 mL), one 5 oz glass of wine (148 mL), or one 1 oz glass of hard liquor (44 mL). Do not use any products that contain nicotine or tobacco. These products include cigarettes, chewing tobacco, and vaping devices, such as e-cigarettes. If you need help quitting, ask your health care provider. Activity  Follow a regular exercise program to stay fit. This will help you maintain your balance. Ask your health care provider what types of exercise are appropriate for you. If you need a cane or walker, use it as recommended by your health care provider. Wear supportive shoes that have nonskid soles. Safety  Remove any tripping hazards, such as rugs, cords, and clutter. Install safety equipment such as grab bars in bathrooms and safety rails on stairs. Keep rooms and walkways well-lit. General instructions Talk with your health care provider about your risks for falling. Tell your health care provider if: You fall. Be sure to tell your health care provider about all falls, even ones that seem minor. You feel dizzy, tiredness (fatigue), or off-balance. Take over-the-counter and prescription medicines only as told by your health care provider. These include supplements. Eat a healthy diet and maintain a healthy weight. A healthy diet includes low-fat dairy products, low-fat (lean) meats, and fiber from whole grains, beans, and lots of fruits and vegetables. Stay  current with your vaccines. Schedule regular health, dental, and eye exams. Summary Having a healthy lifestyle and getting preventive care can help to protect your health and wellness after age 2. Screening and testing are the best way to find a health problem early and help you avoid having a fall. Early diagnosis and treatment give you the best chance for managing medical conditions that are more common for people who are older than age 52. Falls are a major cause of broken bones and head injuries in people who are older than age 65. Take precautions to prevent a fall at home. Work with your health care provider to learn what changes you can make to improve your health and wellness and to prevent falls. This information is not intended to replace advice given to you by your health care provider. Make sure you discuss any questions you have with your health care provider. Document Revised: 05/29/2020 Document Reviewed: 05/29/2020 Elsevier Patient Education  2024 Elsevier Inc.     Maryagnes Small, MD [facility]

## 2023-05-07 NOTE — Assessment & Plan Note (Addendum)
 BP Readings from Last 3 Encounters:  05/07/23 110/74  05/08/22 134/72  11/05/21 132/86  Well-controlled hypertension Continue amlodipine 10 mg daily and Zestoretic 20-12.5 mg daily Cardiovascular risks associated with hypertension discussed Benefits of exercise discussed Diet and nutrition discussed Blood work done today Follow-up in 6 months

## 2023-05-07 NOTE — Patient Instructions (Signed)
 Health Maintenance After Age 79 After age 4, you are at a higher risk for certain long-term diseases and infections as well as injuries from falls. Falls are a major cause of broken bones and head injuries in people who are older than age 47. Getting regular preventive care can help to keep you healthy and well. Preventive care includes getting regular testing and making lifestyle changes as recommended by your health care provider. Talk with your health care provider about: Which screenings and tests you should have. A screening is a test that checks for a disease when you have no symptoms. A diet and exercise plan that is right for you. What should I know about screenings and tests to prevent falls? Screening and testing are the best ways to find a health problem early. Early diagnosis and treatment give you the best chance of managing medical conditions that are common after age 37. Certain conditions and lifestyle choices may make you more likely to have a fall. Your health care provider may recommend: Regular vision checks. Poor vision and conditions such as cataracts can make you more likely to have a fall. If you wear glasses, make sure to get your prescription updated if your vision changes. Medicine review. Work with your health care provider to regularly review all of the medicines you are taking, including over-the-counter medicines. Ask your health care provider about any side effects that may make you more likely to have a fall. Tell your health care provider if any medicines that you take make you feel dizzy or sleepy. Strength and balance checks. Your health care provider may recommend certain tests to check your strength and balance while standing, walking, or changing positions. Foot health exam. Foot pain and numbness, as well as not wearing proper footwear, can make you more likely to have a fall. Screenings, including: Osteoporosis screening. Osteoporosis is a condition that causes  the bones to get weaker and break more easily. Blood pressure screening. Blood pressure changes and medicines to control blood pressure can make you feel dizzy. Depression screening. You may be more likely to have a fall if you have a fear of falling, feel depressed, or feel unable to do activities that you used to do. Alcohol use screening. Using too much alcohol can affect your balance and may make you more likely to have a fall. Follow these instructions at home: Lifestyle Do not drink alcohol if: Your health care provider tells you not to drink. If you drink alcohol: Limit how much you have to: 0-1 drink a day for women. 0-2 drinks a day for men. Know how much alcohol is in your drink. In the [place] one drink equals one 12 oz bottle of beer (355 mL), one 5 oz glass of wine (148 mL), or one 1 oz glass of hard liquor (44 mL). Do not use any products that contain nicotine or tobacco. These products include cigarettes, chewing tobacco, and vaping devices, such as e-cigarettes. If you need help quitting, ask your health care provider. Activity  Follow a regular exercise program to stay fit. This will help you maintain your balance. Ask your health care provider what types of exercise are appropriate for you. If you need a cane or walker, use it as recommended by your health care provider. Wear supportive shoes that have nonskid soles. Safety  Remove any tripping hazards, such as rugs, cords, and clutter. Install safety equipment such as grab bars in bathrooms and safety rails on stairs. Keep rooms and walkways  well-lit. General instructions Talk with your health care provider about your risks for falling. Tell your health care provider if: You fall. Be sure to tell your health care provider about all falls, even ones that seem minor. You feel dizzy, tiredness (fatigue), or off-balance. Take over-the-counter and prescription medicines only as told by your health care provider. These include  supplements. Eat a healthy diet and maintain a healthy weight. A healthy diet includes low-fat dairy products, low-fat (lean) meats, and fiber from whole grains, beans, and lots of fruits and vegetables. Stay current with your vaccines. Schedule regular health, dental, and eye exams. Summary Having a healthy lifestyle and getting preventive care can help to protect your health and wellness after age 11. Screening and testing are the best way to find a health problem early and help you avoid having a fall. Early diagnosis and treatment give you the best chance for managing medical conditions that are more common for people who are older than age 28. Falls are a major cause of broken bones and head injuries in people who are older than age 48. Take precautions to prevent a fall at home. Work with your health care provider to learn what changes you can make to improve your health and wellness and to prevent falls. This information is not intended to replace advice given to you by your health care provider. Make sure you discuss any questions you have with your health care provider. Document Revised: 05/29/2020 Document Reviewed: 05/29/2020 Elsevier Patient Education  2024 [employer].

## 2023-05-07 NOTE — Assessment & Plan Note (Signed)
Diet and nutrition discussed Blood work done today including hemoglobin A1c Benefits of exercise discussed

## 2023-05-07 NOTE — Assessment & Plan Note (Signed)
Cardiovascular risks associated with dyslipidemia discussed Diet and nutrition discussed Lipid profile done today Continue rosuvastatin 10 mg daily Follow-up in 6 months

## 2023-06-04 ENCOUNTER — Other Ambulatory Visit: Payer: Self-pay | Admitting: Emergency Medicine

## 2023-06-04 DIAGNOSIS — I10 Essential (primary) hypertension: Secondary | ICD-10-CM

## 2023-06-05 ENCOUNTER — Ambulatory Visit: Payer: PPO

## 2023-06-05 VITALS — Ht 61.0 in | Wt 148.0 lb

## 2023-06-05 DIAGNOSIS — R7303 Prediabetes: Secondary | ICD-10-CM

## 2023-06-05 DIAGNOSIS — Z1159 Encounter for screening for other viral diseases: Secondary | ICD-10-CM

## 2023-06-05 DIAGNOSIS — Z0000 Encounter for general adult medical examination without abnormal findings: Secondary | ICD-10-CM

## 2023-06-05 NOTE — Progress Notes (Signed)
 Subjective:   Brenda [name] is a 78 y.o. who presents for a Medicare Wellness preventive visit.  As a reminder, Annual Wellness Visits don't include a physical exam, and some assessments may be limited, especially if this visit is performed virtually. We may recommend an in-person visit if needed.  Visit Complete: Virtual I connected with  Brenda [name] on 06/05/23 by a audio enabled telemedicine application and verified that I am speaking with the correct person using two identifiers.  Patient Location: Home  Provider Location: Office/Clinic  I discussed the limitations of evaluation and management by telemedicine. The patient expressed understanding and agreed to proceed.  Vital Signs: Because this visit was a virtual/telehealth visit, some criteria may be missing or patient reported. Any vitals not documented were not able to be obtained and vitals that have been documented are patient reported.  VideoDeclined- This patient declined Librarian, academic. Therefore the visit was completed with audio only.  Persons Participating in Visit: Patient.  AWV Questionnaire: Yes: Patient Medicare AWV questionnaire was completed by the patient on 06/01/2023; I have confirmed that all information answered by patient is correct and no changes since this date.  Cardiac Risk Factors include: advanced age (>64men, >56 women);hypertension;dyslipidemia     Objective:     Today's Vitals   06/05/23 0927  Weight: 148 lb (67.1 kg)  Height: 5\' 1"  (1.549 m)   Body mass index is 27.96 kg/m.     06/05/2023    9:35 AM 06/04/2022   10:07 AM 06/05/2021    8:50 AM 02/11/2019    8:40 AM 06/14/2016    2:57 PM  Advanced Directives  Does Patient Have a Medical Advance Directive? Yes Yes Yes Yes No  Type of Estate agent of [place];Living will Healthcare Power of [place];Living will Living will;Healthcare Power of [employer] Power of Attorney    Does patient want to make changes to medical advance directive?   No - Patient declined    Copy of Healthcare Power of Attorney in Chart? No - copy requested No - copy requested No - copy requested No - copy requested   Would patient like information on creating a medical advance directive?     No - Patient declined    Current Medications (verified) Outpatient Encounter Medications as of 06/05/2023  Medication Sig   amLODipine  (NORVASC ) 10 MG tablet Take 1 tablet by mouth once daily   dorzolamide-timolol (COSOPT) 22.3-6.8 MG/ML ophthalmic solution    fexofenadine (ALLEGRA) 180 MG tablet Take 180 mg by mouth daily.   latanoprost (XALATAN) 0.005 % ophthalmic solution Place 1 drop into both eyes at bedtime.   lisinopril -hydrochlorothiazide  (ZESTORETIC ) 20-12.5 MG tablet Take 1 tablet by mouth once daily   rosuvastatin  (CRESTOR ) 10 MG tablet Take 1 tablet (10 mg total) by mouth daily.   No facility-administered encounter medications on file as of 06/05/2023.    Allergies (verified) Patient has no known allergies.   History: Past Medical History:  Diagnosis Date   Allergy    seasonal   Chronic rhinitis 01/19/2007   COLONIC POLYPS, HX OF 06/24/2006   DIVERTICULOSIS, COLON 11/11/2006   HYPERLIPIDEMIA 12/20/2008   HYPERTENSION 06/24/2006   Impaired glucose tolerance    MENOPAUSAL SYNDROME 06/24/2006   Past Surgical History:  Procedure Laterality Date   BREAST BIOPSY     COLONOSCOPY     EYE SURGERY Left 09/302021   EYE SURGERY Right 11/04/2019   POLYPECTOMY     TUBAL LIGATION  1994   benign   Family History  Problem Relation Age of Onset   Mental illness Mother    Cancer Father        lung   Parkinsonism Brother    Colon cancer Neg Hx    Social History   Socioeconomic History   Marital status: Married    Spouse name: Gregeory   Number of children: Not on file   Years of education: Not on file   Highest education level: 12th grade  Occupational History   Occupation:  RETIRED  Tobacco Use   Smoking status: Never   Smokeless tobacco: Never  Vaping Use   Vaping status: Never Used  Substance and Sexual Activity   Alcohol use: No   Drug use: No   Sexual activity: Not on file  Other Topics Concern   Not on file  Social History Narrative   Lives with husband/2025   Social Drivers of Health   Financial Resource Strain: Low Risk  (06/05/2023)   Overall Financial Resource Strain (CARDIA)    Difficulty of Paying Living Expenses: Not hard at all  Food Insecurity: No Food Insecurity (06/05/2023)   Hunger Vital Sign    Worried About Running Out of Food in the Last Year: Never true    Ran Out of Food in the Last Year: Never true  Transportation Needs: No Transportation Needs (06/05/2023)   PRAPARE - Administrator, Civil Service (Medical): No    Lack of Transportation (Non-Medical): No  Physical Activity: Sufficiently Active (06/05/2023)   Exercise Vital Sign    Days of Exercise per Week: 6 days    Minutes of Exercise per Session: 60 min  Stress: No Stress Concern Present (06/05/2023)   [employer] of Occupational Health - Occupational Stress Questionnaire    Feeling of Stress : Not at all  Social Connections: Moderately Isolated (06/05/2023)   Social Connection and Isolation Panel [NHANES]    Frequency of Communication with Friends and Family: Twice a week    Frequency of Social Gatherings with Friends and Family: Once a week    Attends Religious Services: Never    Database administrator or Organizations: No    Attends Engineer, structural: Never    Marital Status: Married    Tobacco Counseling Counseling given: Not Answered    Clinical Intake:  Pre-visit preparation completed: Yes  Pain : No/denies pain     BMI - recorded: 27.96 Nutritional Status: BMI 25 -29 Overweight Nutritional Risks: None Diabetes: No  Lab Results  Component Value Date   HGBA1C 6.6 (H) 05/07/2023   HGBA1C 6.6 (H) 11/06/2022   HGBA1C  6.6 (H) 05/08/2022     How often do you need to have someone help you when you read instructions, pamphlets, or other written materials from your doctor or pharmacy?: 1 - Never  Interpreter Needed?: No  Information entered by :: Dianne Whelchel, RMA   Activities of Daily Living     06/01/2023   10:12 AM  In your present state of health, do you have any difficulty performing the following activities:  Hearing? 0  Vision? 0  Difficulty concentrating or making decisions? 0  Walking or climbing stairs? 0  Dressing or bathing? 0  Doing errands, shopping? 0  Preparing Food and eating ? N  Using the Toilet? N  In the past six months, have you accidently leaked urine? N  Do you have problems with loss of bowel control? N  Managing your  Medications? N  Managing your Finances? N  Housekeeping or managing your Housekeeping? N    Patient Care Team: Elvira Hammersmith, MD as PCP - General (Internal Medicine) Karmen Pa, MD as Consulting Physician (Ophthalmology)  Indicate any recent Medical Services you may have received from other than [facility] providers in the past year (date may be approximate).     Assessment:    This is a routine wellness examination for [place].  Hearing/Vision screen Hearing Screening - Comments:: Denies hearing difficulties   Vision Screening - Comments:: Denies vision issues./ Dr. Paulene Boron    Goals Addressed             This Visit's Progress    Cholesterol Goals: Continue to maintain my health and watch my cholesterol and carbohydrate intake.   On track    The impact of high cholesterol on health Elevated cholesterol levels are one of the risk factors for heart disease, stroke, and peripheral artery disease. The mechanism involving cholesterol in all three diseases is the same; plaque buildup within arteries decreases blood flow affecting the function of the cells and organs that these blood vessels supply.  Atherosclerotic heart disease or narrowed  coronary arteries in the heart can cause the symptoms of angina when the heart muscle is not provided with enough oxygen to function. Decreased blood supply to the brain may be due to narrowed small arteries in the brain or because the larger carotid arteries in the neck may become blocked. This can result in a transient ischemic attack (TIA) or stroke. Peripheral artery disease describes the gradual narrowing of the arteries that supply the legs. During exercise, if the legs do not get enough blood supply, they can develop pain, called claudication. Other arteries in the body may also be affected by plaque buildup causing them to narrow, including the mesenteric arteries to the intestine and the renal arteries to the kidney.  Which foods can help lower cholesterol? The American Heart Association has developed diet guidelines to help lower cholesterol levels. It may be a challenge to read the nutritional contents on food packaging and on restaurant menus or to do the math, but the benefit will decrease the risk of heart attack and stroke.  Limit total fat intake to less than 25% to 35% of your total calories each day. Limit saturated fat intake to less than 7% of total daily calories. Limit trans fat intake to less than 1% of total daily calories. The remaining fat should come from sources of monounsaturated and polyunsaturated fats that are found in unsalted nuts and seeds, fish (especially oily fish, such as salmon, trout, and herring, at least twice per week) and vegetable oils. Limit cholesterol intake to less than 300 mg per day, for most people. If you have coronary heart disease or your LDL cholesterol level is 100 mg/dL or greater, limit your cholesterol intake to less than 200 milligrams a day. Some food groups may be beneficial in directly lowering cholesterol levels and include foods with plant sterol additives, high fiber foods like bran, oatmeal, and fruits like apples and pears, fish,  nuts, and olive oil. Some of these foods like nuts and fruits are also high in calories, so moderation is always advisable.       Depression Screen     06/05/2023    9:37 AM 11/06/2022    8:03 AM 06/04/2022   10:11 AM 11/05/2021    8:09 AM 06/05/2021    8:51 AM 11/01/2020    8:12  AM 02/10/2020    8:26 AM  PHQ 2/9 Scores  PHQ - 2 Score 0 0 0 0 0 0 0  PHQ- 9 Score 0  0        Fall Risk     06/01/2023   10:12 AM 11/06/2022    8:03 AM 06/04/2022   10:08 AM 11/05/2021    8:09 AM 06/05/2021    8:51 AM  Fall Risk   Falls in the past year? 0 0 0 0 0  Number falls in past yr: 0 0 0 0 0  Injury with Fall? 0 0 0 0 0  Risk for fall due to :  No Fall Risks No Fall Risks No Fall Risks No Fall Risks  Follow up Falls evaluation completed;Falls prevention discussed Falls evaluation completed Falls prevention discussed Falls evaluation completed Falls evaluation completed    MEDICARE RISK AT HOME:  Medicare Risk at Home Any stairs in or around the home?: (Patient-Rptd) No If so, are there any without handrails?: (Patient-Rptd) No Home free of loose throw rugs in walkways, pet beds, electrical cords, etc?: (Patient-Rptd) Yes Adequate lighting in your home to reduce risk of falls?: (Patient-Rptd) Yes Life alert?: (Patient-Rptd) No Use of a cane, walker or w/c?: (Patient-Rptd) No Grab bars in the bathroom?: (Patient-Rptd) No Shower chair or bench in shower?: (Patient-Rptd) No Elevated toilet seat or a handicapped toilet?: (Patient-Rptd) No  TIMED UP AND GO:  Was the test performed?  No  Cognitive Function: Declined/Normal: No cognitive concerns noted by patient or family. Patient alert, oriented, able to answer questions appropriately and recall recent events. No signs of memory loss or confusion.        06/04/2022   10:11 AM 06/05/2021    9:00 AM 02/11/2019    8:41 AM  6CIT Screen  What Year? 0 points 0 points 0 points  What month? 0 points  0 points  What time? 0 points 0 points 0  points  Count back from 20 0 points 0 points 0 points  Months in reverse 0 points 0 points 0 points  Repeat phrase 0 points 0 points 0 points  Total Score 0 points  0 points    Immunizations Immunization History  Administered Date(s) Administered   Fluad Quad(high Dose 65+) 10/08/2018, 10/11/2019   Influenza Split 10/03/2011   Influenza Whole 10/28/2007, 10/02/2010   Influenza, High Dose Seasonal PF 10/11/2014, 10/04/2015, 10/18/2016   Influenza,inj,Quad PF,6+ Mos 09/28/2012, 10/10/2013   Influenza-Unspecified 10/14/2017, 09/29/2020, 10/08/2021, 09/22/2022   Moderna Covid-19 Vaccine Bivalent Booster 24yrs & up 11/21/2020   Moderna Sars-Covid-2 Vaccination 07/01/2019, 07/29/2019, 01/31/2020, 07/05/2020   Pneumococcal Conjugate-13 01/27/2013   Pneumococcal Polysaccharide-23 11/11/2006, 04/11/2014   Unspecified SARS-COV-2 Vaccination 10/26/2021, 09/29/2022   Zoster, Live 08/17/2012    Screening Tests Health Maintenance  Topic Date Due   Hepatitis C Screening  Never done   Zoster Vaccines- Shingrix (1 of 2) 07/03/1995   DEXA SCAN  Never done   FOOT EXAM  02/16/2020   OPHTHALMOLOGY EXAM  06/02/2022   COVID-19 Vaccine (8 - 2024-25 season) 03/29/2023   Medicare Annual Wellness (AWV)  06/04/2023   DTaP/Tdap/Td (1 - Tdap) 11/06/2023 (Originally 07/02/1964)   MAMMOGRAM  07/17/2023   INFLUENZA VACCINE  08/22/2023   HEMOGLOBIN A1C  11/06/2023   Diabetic kidney evaluation - eGFR measurement  05/06/2024   Diabetic kidney evaluation - Urine ACR  05/06/2024   Pneumonia Vaccine 67+ Years old  Completed   HPV VACCINES  Aged Out   Meningococcal  B Vaccine  Aged Out   Colonoscopy  Discontinued    Health Maintenance  Health Maintenance Due  Topic Date Due   Hepatitis C Screening  Never done   Zoster Vaccines- Shingrix (1 of 2) 07/03/1995   DEXA SCAN  Never done   FOOT EXAM  02/16/2020   OPHTHALMOLOGY EXAM  06/02/2022   COVID-19 Vaccine (8 - 2024-25 season) 03/29/2023   Medicare  Annual Wellness (AWV)  06/04/2023   Health Maintenance Items Addressed: Diabetic Foot Exam scheduled, Hepatitis C Screening  Additional Screening:  Vision Screening: Recommended annual ophthalmology exams for early detection of glaucoma and other disorders of the eye.  Dental Screening: Recommended annual dental exams for proper oral hygiene  Community Resource Referral / Chronic Care Management: CRR required this visit?  No   CCM required this visit?  No   Plan:    I have personally reviewed and noted the following in the patient's chart:   Medical and social history Use of alcohol, tobacco or illicit drugs  Current medications and supplements including opioid prescriptions. Patient is not currently taking opioid prescriptions. Functional ability and status Nutritional status Physical activity Advanced directives List of other physicians Hospitalizations, surgeries, and ER visits in previous 12 months Vitals Screenings to include cognitive, depression, and falls Referrals and appointments  In addition, I have reviewed and discussed with patient certain preventive protocols, quality metrics, and best practice recommendations. A written personalized care plan for preventive services as well as general preventive health recommendations were provided to patient.   Alexyia Guarino L Aiden Rao, CMA   06/05/2023   After Visit Summary: (MyChart) Due to this being a telephonic visit, the after visit summary with patients personalized plan was offered to patient via MyChart   Notes: Please refer to Routing Comments.

## 2023-06-05 NOTE — Patient Instructions (Signed)
 Brenda [name] , Thank you for taking time out of your busy schedule to complete your Annual Wellness Visit with me. I enjoyed our conversation and look forward to speaking with you again next year. I, as well as your care team,  appreciate your ongoing commitment to your health goals. Please review the following plan we discussed and let me know if I can assist you in the future. Your Game plan/ To Do List     Follow up Visits: Next Medicare AWV with our clinical staff: 06/07/2024.   Have you seen your provider in the last 6 months (3 months if uncontrolled diabetes)? Yes Next Office Visit with your provider: 11/06/2023.  Clinician Recommendations:  Aim for 30 minutes of exercise or brisk walking, 6-8 glasses of water, and 5 servings of fruits and vegetables each day. You are due for a foot exam and a Hep C screening, which will be done during your next office visit.  Keep up the good work.       This is a list of the screening recommended for you and due dates:  Health Maintenance  Topic Date Due   Hepatitis C Screening  Never done   Zoster (Shingles) Vaccine (1 of 2) 07/03/1995   DEXA scan (bone density measurement)  Never done   Eye exam for diabetics  06/02/2022   COVID-19 Vaccine (8 - 2024-25 season) 03/29/2023   DTaP/Tdap/Td vaccine (1 - Tdap) 11/06/2023*   Mammogram  07/17/2023   Flu Shot  08/22/2023   Hemoglobin A1C  11/06/2023   Yearly kidney function blood test for diabetes  05/06/2024   Yearly kidney health urinalysis for diabetes  05/06/2024   Complete foot exam   06/04/2024   Medicare Annual Wellness Visit  06/04/2024   Pneumonia Vaccine  Completed   HPV Vaccine  Aged Out   Meningitis B Vaccine  Aged Out   Colon Cancer Screening  Discontinued  *Topic was postponed. The date shown is not the original due date.    Advanced directives: (Copy Requested) Please bring a copy of your health care power of attorney and living will to the office to be added to your chart at your  convenience. You can mail to [facility] [address] [place], [place] [address] or email to [email or web address] Advance Care Planning is important because it:  [x]  Makes sure you receive the medical care that is consistent with your values, goals, and preferences  [x]  It provides guidance to your family and loved ones and reduces their decisional burden about whether or not they are making the right decisions based on your wishes.  Follow the link provided in your after visit summary or read over the paperwork we have mailed to you to help you started getting your Advance Directives in place. If you need assistance in completing these, please reach out to us  so that we can help you!  See attachments for Preventive Care and Fall Prevention Tips.

## 2023-06-13 DIAGNOSIS — I10 Essential (primary) hypertension: Secondary | ICD-10-CM | POA: Diagnosis not present

## 2023-06-13 DIAGNOSIS — Z961 Presence of intraocular lens: Secondary | ICD-10-CM | POA: Diagnosis not present

## 2023-06-13 DIAGNOSIS — H02839 Dermatochalasis of unspecified eye, unspecified eyelid: Secondary | ICD-10-CM | POA: Diagnosis not present

## 2023-06-13 DIAGNOSIS — H401112 Primary open-angle glaucoma, right eye, moderate stage: Secondary | ICD-10-CM | POA: Diagnosis not present

## 2023-10-10 ENCOUNTER — Other Ambulatory Visit: Payer: Self-pay | Admitting: Radiology

## 2023-10-10 DIAGNOSIS — Z23 Encounter for immunization: Secondary | ICD-10-CM

## 2023-10-10 MED ORDER — COVID-19 MRNA VACC (MODERNA) 50 MCG/0.5ML IM SUSY: 0.5000 mL | PREFILLED_SYRINGE | Freq: Once | INTRAMUSCULAR | 0 refills | Status: AC

## 2023-11-06 ENCOUNTER — Ambulatory Visit: Admitting: Emergency Medicine

## 2023-11-06 ENCOUNTER — Ambulatory Visit: Payer: Self-pay | Admitting: Emergency Medicine

## 2023-11-06 ENCOUNTER — Encounter: Payer: Self-pay | Admitting: Emergency Medicine

## 2023-11-06 VITALS — BP 130/70 | HR 65 | Temp 98.3°F | Ht 61.0 in | Wt 146.0 lb

## 2023-11-06 DIAGNOSIS — I10 Essential (primary) hypertension: Secondary | ICD-10-CM

## 2023-11-06 DIAGNOSIS — E785 Hyperlipidemia, unspecified: Secondary | ICD-10-CM | POA: Diagnosis not present

## 2023-11-06 DIAGNOSIS — R7303 Prediabetes: Secondary | ICD-10-CM | POA: Diagnosis not present

## 2023-11-06 DIAGNOSIS — R431 Parosmia: Secondary | ICD-10-CM

## 2023-11-06 LAB — COMPREHENSIVE METABOLIC PANEL WITH GFR
ALT: 17 U/L (ref 0–35)
AST: 21 U/L (ref 0–37)
Albumin: 4.6 g/dL (ref 3.5–5.2)
Alkaline Phosphatase: 58 U/L (ref 39–117)
BUN: 11 mg/dL (ref 6–23)
CO2: 32 meq/L (ref 19–32)
Calcium: 9.6 mg/dL (ref 8.4–10.5)
Chloride: 102 meq/L (ref 96–112)
Creatinine, Ser: 0.8 mg/dL (ref 0.40–1.20)
GFR: 70.61 mL/min (ref >60.00)
Glucose, Bld: 134 mg/dL — ABNORMAL HIGH (ref 70–99)
Potassium: 3.4 meq/L — ABNORMAL LOW (ref 3.5–5.1)
Sodium: 143 meq/L (ref 135–145)
Total Bilirubin: 0.9 mg/dL (ref 0.2–1.2)
Total Protein: 7.4 g/dL (ref 6.0–8.3)

## 2023-11-06 LAB — CBC WITH DIFFERENTIAL/PLATELET
Basophils Absolute: 0 K/uL (ref 0.0–0.1)
Basophils Relative: 0.7 % (ref 0.0–3.0)
Eosinophils Absolute: 0.1 K/uL (ref 0.0–0.7)
Eosinophils Relative: 1.6 % (ref 0.0–5.0)
HCT: 41.3 % (ref 36.0–46.0)
Hemoglobin: 14.2 g/dL (ref 12.0–15.0)
Lymphocytes Relative: 31.9 % (ref 12.0–46.0)
Lymphs Abs: 1.4 K/uL (ref 0.7–4.0)
MCHC: 34.4 g/dL (ref 30.0–36.0)
MCV: 88.1 fl (ref 78.0–100.0)
Monocytes Absolute: 0.4 K/uL (ref 0.1–1.0)
Monocytes Relative: 8.5 % (ref 3.0–12.0)
Neutro Abs: 2.6 K/uL (ref 1.4–7.7)
Neutrophils Relative %: 57.3 % (ref 43.0–77.0)
Platelets: 217 K/uL (ref 150.0–400.0)
RBC: 4.69 Mil/uL (ref 3.87–5.11)
RDW: 14.3 % (ref 11.5–15.5)
WBC: 4.5 K/uL (ref 4.0–10.5)

## 2023-11-06 LAB — LIPID PANEL
Cholesterol: 106 mg/dL (ref 0–200)
HDL: 44.7 mg/dL (ref >39.00)
LDL Cholesterol: 42 mg/dL (ref 0–99)
NonHDL: 61.08
Total CHOL/HDL Ratio: 2
Triglycerides: 96 mg/dL (ref 0.0–149.0)
VLDL: 19.2 mg/dL (ref 0.0–40.0)

## 2023-11-06 LAB — HEMOGLOBIN A1C: Hgb A1c MFr Bld: 6.8 % — ABNORMAL HIGH (ref 4.6–6.5)

## 2023-11-06 NOTE — Patient Instructions (Signed)
 Health Maintenance After Age 78 After age 27, you are at a higher risk for certain long-term diseases and infections as well as injuries from falls. Falls are a major cause of broken bones and head injuries in people who are older than age 73. Getting regular preventive care can help to keep you healthy and well. Preventive care includes getting regular testing and making lifestyle changes as recommended by your health care provider. Talk with your health care provider about: Which screenings and tests you should have. A screening is a test that checks for a disease when you have no symptoms. A diet and exercise plan that is right for you. What should I know about screenings and tests to prevent falls? Screening and testing are the best ways to find a health problem early. Early diagnosis and treatment give you the best chance of managing medical conditions that are common after age 90. Certain conditions and lifestyle choices may make you more likely to have a fall. Your health care provider may recommend: Regular vision checks. Poor vision and conditions such as cataracts can make you more likely to have a fall. If you wear glasses, make sure to get your prescription updated if your vision changes. Medicine review. Work with your health care provider to regularly review all of the medicines you are taking, including over-the-counter medicines. Ask your health care provider about any side effects that may make you more likely to have a fall. Tell your health care provider if any medicines that you take make you feel dizzy or sleepy. Strength and balance checks. Your health care provider may recommend certain tests to check your strength and balance while standing, walking, or changing positions. Foot health exam. Foot pain and numbness, as well as not wearing proper footwear, can make you more likely to have a fall. Screenings, including: Osteoporosis screening. Osteoporosis is a condition that causes  the bones to get weaker and break more easily. Blood pressure screening. Blood pressure changes and medicines to control blood pressure can make you feel dizzy. Depression screening. You may be more likely to have a fall if you have a fear of falling, feel depressed, or feel unable to do activities that you used to do. Alcohol  use screening. Using too much alcohol  can affect your balance and may make you more likely to have a fall. Follow these instructions at home: Lifestyle Do not drink alcohol  if: Your health care provider tells you not to drink. If you drink alcohol : Limit how much you have to: 0-1 drink a day for women. 0-2 drinks a day for men. Know how much alcohol  is in your drink. In the [place] one drink equals one 12 oz bottle of beer (355 mL), one 5 oz glass of wine (148 mL), or one 1 oz glass of hard liquor (44 mL). Do not use any products that contain nicotine or tobacco. These products include cigarettes, chewing tobacco, and vaping devices, such as e-cigarettes. If you need help quitting, ask your health care provider. Activity  Follow a regular exercise program to stay fit. This will help you maintain your balance. Ask your health care provider what types of exercise are appropriate for you. If you need a cane or walker, use it as recommended by your health care provider. Wear supportive shoes that have nonskid soles. Safety  Remove any tripping hazards, such as rugs, cords, and clutter. Install safety equipment such as grab bars in bathrooms and safety rails on stairs. Keep rooms and walkways  well-lit. General instructions Talk with your health care provider about your risks for falling. Tell your health care provider if: You fall. Be sure to tell your health care provider about all falls, even ones that seem minor. You feel dizzy, tiredness (fatigue), or off-balance. Take over-the-counter and prescription medicines only as told by your health care provider. These include  supplements. Eat a healthy diet and maintain a healthy weight. A healthy diet includes low-fat dairy products, low-fat (lean) meats, and fiber from whole grains, beans, and lots of fruits and vegetables. Stay current with your vaccines. Schedule regular health, dental, and eye exams. Summary Having a healthy lifestyle and getting preventive care can help to protect your health and wellness after age 15. Screening and testing are the best way to find a health problem early and help you avoid having a fall. Early diagnosis and treatment give you the best chance for managing medical conditions that are more common for people who are older than age 42. Falls are a major cause of broken bones and head injuries in people who are older than age 64. Take precautions to prevent a fall at home. Work with your health care provider to learn what changes you can make to improve your health and wellness and to prevent falls. This information is not intended to replace advice given to you by your health care provider. Make sure you discuss any questions you have with your health care provider. Document Revised: 05/29/2020 Document Reviewed: 05/29/2020 Elsevier Patient Education  2024 [employer].

## 2023-11-06 NOTE — Assessment & Plan Note (Signed)
Diet and nutrition discussed Blood work done today including hemoglobin A1c Benefits of exercise discussed

## 2023-11-06 NOTE — Assessment & Plan Note (Signed)
Cardiovascular risks associated with dyslipidemia discussed Diet and nutrition discussed Lipid profile done today Continue rosuvastatin 10 mg daily Follow-up in 6 months

## 2023-11-06 NOTE — Progress Notes (Signed)
 Brenda [name] 78 y.o.   Chief Complaint  Patient presents with   Follow-up    Patient here for 6 month f/u for HTN.Patient had questions about advil  sinus/congestion, patient states that she is smelling chemicals here the past week doesn't know if its the affects of the medication    HISTORY OF PRESENT ILLNESS: This is a 78 y.o. female here for 31-month follow-up of chronic medical conditions including hypertension and dyslipidemia Overall doing well. Has been recently congested.  Taking Advil  sinus.  Smelling disturbances including chemical smells. Has no other complaints or medical concerns today.  HPI   Prior to Admission medications   Medication Sig Start Date End Date Taking? Authorizing Provider  amLODipine  (NORVASC ) 10 MG tablet Take 1 tablet by mouth once daily 06/04/23  Yes Emanuelle Bastos Jose, MD  dorzolamide-timolol (COSOPT) 22.3-6.8 MG/ML ophthalmic solution  02/25/17  Yes [provider]  fexofenadine (ALLEGRA) 180 MG tablet Take 180 mg by mouth daily.   Yes [provider]  latanoprost (XALATAN) 0.005 % ophthalmic solution Place 1 drop into both eyes at bedtime. 05/02/16  Yes [provider]  lisinopril -hydrochlorothiazide  (ZESTORETIC ) 20-12.5 MG tablet Take 1 tablet by mouth once daily 06/04/23  Yes Hurshel Bouillon, Emil Schanz, MD  rosuvastatin  (CRESTOR ) 10 MG tablet Take 1 tablet (10 mg total) by mouth daily. 11/06/22 11/06/23 Yes [place] Emil Schanz, MD    No Known Allergies  Patient Active Problem List   Diagnosis Date Noted   Prediabetes 02/16/2019   Dyslipidemia 12/20/2008   Diverticulosis of colon 11/11/2006   Hypertension, essential 06/24/2006   History of colonic polyps 06/24/2006    Past Medical History:  Diagnosis Date   Allergy    seasonal   Chronic rhinitis 01/19/2007   COLONIC POLYPS, HX OF 06/24/2006   DIVERTICULOSIS, COLON 11/11/2006   HYPERLIPIDEMIA 12/20/2008   HYPERTENSION 06/24/2006   Impaired glucose tolerance     MENOPAUSAL SYNDROME 06/24/2006    Past Surgical History:  Procedure Laterality Date   BREAST BIOPSY     COLONOSCOPY     EYE SURGERY Left 09/302021   EYE SURGERY Right 11/04/2019   POLYPECTOMY     TUBAL LIGATION  1994   benign    Social History   Socioeconomic History   Marital status: Married    Spouse name: Paediatric nurse   Number of children: Not on file   Years of education: Not on file   Highest education level: 12th grade  Occupational History   Occupation: RETIRED  Tobacco Use   Smoking status: Never   Smokeless tobacco: Never  Vaping Use   Vaping status: Never Used  Substance and Sexual Activity   Alcohol use: No   Drug use: No   Sexual activity: Not on file  Other Topics Concern   Not on file  Social History Narrative   Lives with husband/2025   Social Drivers of Health   Financial Resource Strain: Low Risk  (10/30/2023)   Overall Financial Resource Strain (CARDIA)    Difficulty of Paying Living Expenses: Not hard at all  Food Insecurity: No Food Insecurity (10/30/2023)   Hunger Vital Sign    Worried About Radiation protection practitioner of Food in the Last Year: Never true    Ran Out of Food in the Last Year: Never true  Transportation Needs: No Transportation Needs (10/30/2023)   PRAPARE - Administrator, Civil Service (Medical): No    Lack of Transportation (Non-Medical): No  Physical Activity: Sufficiently Active (10/30/2023)  Exercise Vital Sign    Days of Exercise per Week: 5 days    Minutes of Exercise per Session: 60 min  Stress: No Stress Concern Present (10/30/2023)   [employer] of Occupational Health - Occupational Stress Questionnaire    Feeling of Stress: Not at all  Social Connections: Unknown (10/30/2023)   Social Connection and Isolation Panel    Frequency of Communication with Friends and Family: Twice a week    Frequency of Social Gatherings with Friends and Family: Patient declined    Attends Religious Services: Patient declined    Automotive engineer or Organizations: No    Attends Engineer, structural: Not on file    Marital Status: Married  Catering manager Violence: Not At Risk (06/05/2023)   Humiliation, Afraid, Rape, and Kick questionnaire    Fear of Current or Ex-Partner: No    Emotionally Abused: No    Physically Abused: No    Sexually Abused: No    Family History  Problem Relation Age of Onset   Mental illness Mother    Cancer Father        lung   Parkinsonism Brother    Colon cancer Neg Hx      Review of Systems  Constitutional: Negative.  Negative for chills and fever.  HENT: Negative.  Negative for congestion and sore throat.   Respiratory: Negative.  Negative for cough and shortness of breath.   Cardiovascular: Negative.  Negative for chest pain and palpitations.  Gastrointestinal:  Negative for abdominal pain, diarrhea, nausea and vomiting.  Genitourinary: Negative.  Negative for dysuria and hematuria.  Skin: Negative.  Negative for rash.  Neurological: Negative.  Negative for dizziness and headaches.  All other systems reviewed and are negative.   Vitals:   11/06/23 0813  BP: (!) 148/72  Pulse: 65  Temp: 98.3 F (36.8 C)  SpO2: 98%    Physical Exam Vitals reviewed.  Constitutional:      Appearance: Normal appearance.  HENT:     Head: Normocephalic.     Mouth/Throat:     Mouth: Mucous membranes are moist.     Pharynx: Oropharynx is clear.  Eyes:     Extraocular Movements: Extraocular movements intact.     Pupils: Pupils are equal, round, and reactive to light.  Cardiovascular:     Rate and Rhythm: Normal rate and regular rhythm.     Pulses: Normal pulses.     Heart sounds: Normal heart sounds.  Pulmonary:     Effort: Pulmonary effort is normal.     Breath sounds: Normal breath sounds.  Abdominal:     Palpations: Abdomen is soft.     Tenderness: There is no abdominal tenderness.  Musculoskeletal:     Cervical back: No tenderness.  Lymphadenopathy:     Cervical:  No cervical adenopathy.  Skin:    General: Skin is warm and dry.     Capillary Refill: Capillary refill takes less than 2 seconds.  Neurological:     General: No focal deficit present.     Mental Status: She is alert and oriented to person, place, and time.  Psychiatric:        Mood and Affect: Mood normal.        Behavior: Behavior normal.      ASSESSMENT & PLAN: A total of 42 minutes was spent with the patient and counseling/coordination of care regarding preparing for this visit, review of most recent office visit notes, review of multiple chronic medical  conditions and their management, cardiovascular risks associated with hypertension and dyslipidemia, review of all medications, review of most recent bloodwork results, review of health maintenance items, education on nutrition, prognosis, documentation, and need for follow up.   Problem List Items Addressed This Visit       Cardiovascular and Mediastinum   Hypertension, essential - Primary   BP Readings from Last 3 Encounters:  11/06/23 (!) 148/72  05/07/23 110/74  05/08/22 134/72   Well-controlled hypertension Continue amlodipine  10 mg daily and Zestoretic  20-12.5 mg daily Cardiovascular risks associated with hypertension discussed Benefits of exercise discussed Diet and nutrition discussed Blood work done today Follow-up in 6 months      Relevant Orders   CBC with Differential/Platelet   Comprehensive metabolic panel with GFR     Other   Dyslipidemia   Cardiovascular risks associated with dyslipidemia discussed Diet and nutrition discussed Lipid profile done today Continue rosuvastatin  10 mg daily Follow-up in 6 months      Relevant Orders   Lipid panel   Prediabetes   Diet and nutrition discussed Blood work done today including hemoglobin A1c Benefits of exercise discussed      Relevant Orders   Hemoglobin A1c   Problem with sense of smell   Could be related to sinus congestion or medication side  effect Advised to stop Advil  sinus which may be raising her blood pressure Recommend instead Coricidin HP as needed Recommend frequent use of saline nasal solution.      Patient Instructions  Health Maintenance After Age 19 After age 48, you are at a higher risk for certain long-term diseases and infections as well as injuries from falls. Falls are a major cause of broken bones and head injuries in people who are older than age 105. Getting regular preventive care can help to keep you healthy and well. Preventive care includes getting regular testing and making lifestyle changes as recommended by your health care provider. Talk with your health care provider about: Which screenings and tests you should have. A screening is a test that checks for a disease when you have no symptoms. A diet and exercise plan that is right for you. What should I know about screenings and tests to prevent falls? Screening and testing are the best ways to find a health problem early. Early diagnosis and treatment give you the best chance of managing medical conditions that are common after age 57. Certain conditions and lifestyle choices may make you more likely to have a fall. Your health care provider may recommend: Regular vision checks. Poor vision and conditions such as cataracts can make you more likely to have a fall. If you wear glasses, make sure to get your prescription updated if your vision changes. Medicine review. Work with your health care provider to regularly review all of the medicines you are taking, including over-the-counter medicines. Ask your health care provider about any side effects that may make you more likely to have a fall. Tell your health care provider if any medicines that you take make you feel dizzy or sleepy. Strength and balance checks. Your health care provider may recommend certain tests to check your strength and balance while standing, walking, or changing positions. Foot health  exam. Foot pain and numbness, as well as not wearing proper footwear, can make you more likely to have a fall. Screenings, including: Osteoporosis screening. Osteoporosis is a condition that causes the bones to get weaker and break more easily. Blood pressure screening. Blood pressure changes  and medicines to control blood pressure can make you feel dizzy. Depression screening. You may be more likely to have a fall if you have a fear of falling, feel depressed, or feel unable to do activities that you used to do. Alcohol use screening. Using too much alcohol can affect your balance and may make you more likely to have a fall. Follow these instructions at home: Lifestyle Do not drink alcohol if: Your health care provider tells you not to drink. If you drink alcohol: Limit how much you have to: 0-1 drink a day for women. 0-2 drinks a day for men. Know how much alcohol is in your drink. In the [place] one drink equals one 12 oz bottle of beer (355 mL), one 5 oz glass of wine (148 mL), or one 1 oz glass of hard liquor (44 mL). Do not use any products that contain nicotine or tobacco. These products include cigarettes, chewing tobacco, and vaping devices, such as e-cigarettes. If you need help quitting, ask your health care provider. Activity  Follow a regular exercise program to stay fit. This will help you maintain your balance. Ask your health care provider what types of exercise are appropriate for you. If you need a cane or walker, use it as recommended by your health care provider. Wear supportive shoes that have nonskid soles. Safety  Remove any tripping hazards, such as rugs, cords, and clutter. Install safety equipment such as grab bars in bathrooms and safety rails on stairs. Keep rooms and walkways well-lit. General instructions Talk with your health care provider about your risks for falling. Tell your health care provider if: You fall. Be sure to tell your health care provider  about all falls, even ones that seem minor. You feel dizzy, tiredness (fatigue), or off-balance. Take over-the-counter and prescription medicines only as told by your health care provider. These include supplements. Eat a healthy diet and maintain a healthy weight. A healthy diet includes low-fat dairy products, low-fat (lean) meats, and fiber from whole grains, beans, and lots of fruits and vegetables. Stay current with your vaccines. Schedule regular health, dental, and eye exams. Summary Having a healthy lifestyle and getting preventive care can help to protect your health and wellness after age 82. Screening and testing are the best way to find a health problem early and help you avoid having a fall. Early diagnosis and treatment give you the best chance for managing medical conditions that are more common for people who are older than age 66. Falls are a major cause of broken bones and head injuries in people who are older than age 44. Take precautions to prevent a fall at home. Work with your health care provider to learn what changes you can make to improve your health and wellness and to prevent falls. This information is not intended to replace advice given to you by your health care provider. Make sure you discuss any questions you have with your health care provider. Document Revised: 05/29/2020 Document Reviewed: 05/29/2020 Elsevier Patient Education  2024 [employer]      Emil Schaumann, MD [facility]

## 2023-11-06 NOTE — Assessment & Plan Note (Signed)
 Could be related to sinus congestion or medication side effect Advised to stop Advil  sinus which may be raising her blood pressure Recommend instead Coricidin HP as needed Recommend frequent use of saline nasal solution.

## 2023-11-06 NOTE — Assessment & Plan Note (Addendum)
 BP Readings from Last 3 Encounters:  11/06/23 (!) 148/72  05/07/23 110/74  05/08/22 134/72   Well-controlled hypertension Continue amlodipine  10 mg daily and Zestoretic  20-12.5 mg daily Cardiovascular risks associated with hypertension discussed Benefits of exercise discussed Diet and nutrition discussed Blood work done today Follow-up in 6 months

## 2023-11-23 ENCOUNTER — Other Ambulatory Visit: Payer: Self-pay | Admitting: Emergency Medicine

## 2023-11-23 DIAGNOSIS — E785 Hyperlipidemia, unspecified: Secondary | ICD-10-CM

## 2024-02-19 ENCOUNTER — Other Ambulatory Visit: Payer: Self-pay | Admitting: Emergency Medicine

## 2024-02-19 DIAGNOSIS — E785 Hyperlipidemia, unspecified: Secondary | ICD-10-CM

## 2024-05-06 ENCOUNTER — Ambulatory Visit: Admitting: Emergency Medicine

## 2024-06-07 ENCOUNTER — Ambulatory Visit
